# Patient Record
Sex: Female | Born: 1970
Health system: Southern US, Community
[De-identification: ages and names within clinical notes are randomized; demographics above are authoritative.]

## PROBLEM LIST (undated history)

## (undated) DIAGNOSIS — J45909 Unspecified asthma, uncomplicated: Secondary | ICD-10-CM

## (undated) DIAGNOSIS — K219 Gastro-esophageal reflux disease without esophagitis: Secondary | ICD-10-CM

## (undated) DIAGNOSIS — M797 Fibromyalgia: Secondary | ICD-10-CM

## (undated) DIAGNOSIS — D510 Vitamin B12 deficiency anemia due to intrinsic factor deficiency: Secondary | ICD-10-CM

## (undated) DIAGNOSIS — K2270 Barrett's esophagus without dysplasia: Secondary | ICD-10-CM

## (undated) DIAGNOSIS — E039 Hypothyroidism, unspecified: Secondary | ICD-10-CM

## (undated) DIAGNOSIS — D649 Anemia, unspecified: Secondary | ICD-10-CM

## (undated) DIAGNOSIS — E041 Nontoxic single thyroid nodule: Secondary | ICD-10-CM

## (undated) DIAGNOSIS — F32A Depression, unspecified: Secondary | ICD-10-CM

## (undated) DIAGNOSIS — Z8719 Personal history of other diseases of the digestive system: Secondary | ICD-10-CM

## (undated) DIAGNOSIS — M1990 Unspecified osteoarthritis, unspecified site: Secondary | ICD-10-CM

## (undated) DIAGNOSIS — M719 Bursopathy, unspecified: Secondary | ICD-10-CM

## (undated) HISTORY — PX: CHOLECYSTECTOMY: SHX55

## (undated) HISTORY — DX: Fibromyalgia: M79.7

## (undated) HISTORY — DX: Barrett's esophagus without dysplasia: K22.70

## (undated) HISTORY — PX: CHOLECYSTECTOMY, LAPAROSCOPIC: SHX56

## (undated) HISTORY — DX: Bursopathy, unspecified: M71.9

## (undated) HISTORY — DX: Vitamin B12 deficiency anemia due to intrinsic factor deficiency: D51.0

---

## 1999-11-28 ENCOUNTER — Inpatient Hospital Stay (HOSPITAL_COMMUNITY): Admission: AD | Admit: 1999-11-28 | Discharge: 1999-12-02 | Payer: Self-pay | Admitting: *Deleted

## 2002-01-23 ENCOUNTER — Other Ambulatory Visit: Admission: RE | Admit: 2002-01-23 | Discharge: 2002-01-23 | Payer: Self-pay | Admitting: *Deleted

## 2003-01-15 ENCOUNTER — Other Ambulatory Visit: Admission: RE | Admit: 2003-01-15 | Discharge: 2003-01-15 | Payer: Self-pay | Admitting: Obstetrics & Gynecology

## 2004-01-24 ENCOUNTER — Other Ambulatory Visit: Admission: RE | Admit: 2004-01-24 | Discharge: 2004-01-24 | Payer: Self-pay | Admitting: Obstetrics & Gynecology

## 2004-05-04 ENCOUNTER — Other Ambulatory Visit: Admission: RE | Admit: 2004-05-04 | Discharge: 2004-05-04 | Payer: Self-pay | Admitting: Obstetrics and Gynecology

## 2005-05-23 ENCOUNTER — Ambulatory Visit: Payer: Self-pay

## 2005-05-30 ENCOUNTER — Ambulatory Visit: Payer: Self-pay

## 2005-06-04 ENCOUNTER — Ambulatory Visit: Payer: Self-pay | Admitting: General Surgery

## 2005-09-17 ENCOUNTER — Ambulatory Visit: Payer: Self-pay | Admitting: Gastroenterology

## 2005-10-31 ENCOUNTER — Ambulatory Visit: Payer: Self-pay | Admitting: Podiatry

## 2006-11-22 ENCOUNTER — Ambulatory Visit: Payer: Self-pay | Admitting: Gastroenterology

## 2007-06-19 ENCOUNTER — Ambulatory Visit: Payer: Self-pay | Admitting: Internal Medicine

## 2009-02-01 ENCOUNTER — Ambulatory Visit: Payer: Self-pay | Admitting: Gastroenterology

## 2009-03-22 ENCOUNTER — Inpatient Hospital Stay (HOSPITAL_COMMUNITY): Admission: AD | Admit: 2009-03-22 | Discharge: 2009-03-23 | Payer: Self-pay | Admitting: Obstetrics & Gynecology

## 2010-03-14 ENCOUNTER — Ambulatory Visit: Payer: Self-pay | Admitting: Internal Medicine

## 2010-04-21 ENCOUNTER — Ambulatory Visit: Payer: Self-pay | Admitting: Internal Medicine

## 2010-12-20 ENCOUNTER — Ambulatory Visit: Payer: Self-pay | Admitting: Internal Medicine

## 2010-12-22 ENCOUNTER — Ambulatory Visit: Payer: Self-pay | Admitting: Urology

## 2011-04-05 LAB — WET PREP, GENITAL
Trich, Wet Prep: NONE SEEN
Yeast Wet Prep HPF POC: NONE SEEN

## 2011-04-05 LAB — URINALYSIS, ROUTINE W REFLEX MICROSCOPIC
Bilirubin Urine: NEGATIVE
Hgb urine dipstick: NEGATIVE
Nitrite: NEGATIVE
Specific Gravity, Urine: >1.03 — ABNORMAL HIGH (ref 1.005–1.030)
pH: 5.5 (ref 5.0–8.0)

## 2011-04-05 LAB — POCT PREGNANCY, URINE: Preg Test, Ur: NEGATIVE

## 2011-05-11 NOTE — Op Note (Signed)
[facility]  Patient:    Valerie Welch                  MRN: [id number] Proc. Date: 11/29/99 Adm. Date:  [id number] Attending:  Ardeen Fillers                           Operative Report  PREOPERATIVE DIAGNOSIS:       Previous cesarean section, oligohydramnios, early  labor.  POSTOPERATIVE DIAGNOSIS:      Left occipitotransverse position, previous cesarean section, oligohydramnios, early labor.  OPERATION:                    Repeat cesarean section, Kerr hysterotomy.  SURGEON:                      Sheronette A. Cherly Hensen, M.D.  ASSISTANT:                    Sung Amabile. Roslyn Smiling, M.D.  ANESTHESIA:                   Failed attempt spinal, failed attempt epidural, general anesthesia.  ESTIMATED BLOOD LOSS:  INDICATIONS:                  This is a 40 year old, gravida 5, para 1-0-3-1, female at 38-1/[redacted] weeks gestation admitted for induction of labor on November 28, 1999, by Huntley Dec E. Roslyn Smiling, M.D. secondary to oligohydramnios.  The patient had a previous cesarean section secondary to arrest of dilatation.  She desired attempt at vaginal birth.  The patient underwent cervical ripening on November 28, 1999.  She was started on Pitocin on November 29, 1999, and once she had reached maximum Pitocin for a prolonged period of time, the cervical examination had still remained at 1 cm, 50%, and -2 presentation.  Amniotomy was performed.  Scant, but clear fluid was noted.  Intrauterine pressure catheter was placed as well as internal  fetal scalp electrode.  Amnioinfusion was done due to the history of oligohydramnios.  The patient progressed to about 3 cm, still at -2 station and  about 50% effaced.  The patient then requested to have a repeat cesarean section. The risks and benefits of the procedure was explained and the patient was transferred to the operating room for her procedure.  DESCRIPTION OF PROCEDURE:     The patient underwent an attempt  at spinal and an  epidural anesthesia, however, due to the lack of an effective regional anesthesia, general induction of anesthesia was performed.  The patient had been at that point sterilely prepped and draped and an indewelling Foley catheter had been placed. A Pfannenstiel skin incision was then made through the previous scar, carried down to the rectus fascia using scalpel.  The rectus fascia was incised in the midline nd extended bilaterally.  The rectus fascia was then bluntly and with sharp dissection dissected off of the rectus muscle in a superior and inferior fashion.  The rectus muscle was split in the midline.  The parietoperitoneum was then entered and extended superiorly and inferiorly.  The vesicouterine peritoneum was then opened. The bladder was then bluntly dissected off the lower uterine segment and displaced from the operative field using a Doyen retractor.  It was noted on entry to the  abdomen that the omentum was adherent to the anterior abdominal wall without defects within it.  A low  transverse uterine incision was then made and extended bluntly.  It was then noted that the baby was in a left occipitotransverse position.  The baby was subsequently delivered.  The nares and mouth were suctioned.  Cord was clamped.  This was a live female infant who was subsequently passed off to the awaiting pediatricians.  The pediatrician assigned Apgars of  and 9 at one and five minutes, respectively.  The placenta was spontaneous. The uterus was exteriorized.  The uterine cavity was cleaned of debris.  The uterine incision was closed with 0 Monocryl in a running locking stitch.  On the right inferior aspect, there was some bleeding noted at the angle.  A figure-of-eight  suture was then placed.  Additional bleeding was still noted at that particular  site and an OLeary suture then was placed with good hemostasis subsequently noted. Normal tubes and ovaries were  noted bilaterally.  The uterus was returned to the abdomen.  The abdomen was irrigated, suctioned of debris.  Reinspection of the incision sites showed good hemostasis.  The parietal and the vesicouterine peritoneum were not closed.  Inspection of the undersurface of the rectus fascia showed good hemostasis.  The rectus fascia was closed with 0 Vicryl x 2.  The subcutaneous area was irrigated.  No bleeders were noted.  The skin was approximated using Ethicon staple.  Specimen was placenta not sent to pathology. Estimated blood loss was about 800 cc.  Intraoperative fluid was 1400 cc of crystalloid.  Urine output was 100 cc of clear yellow urine.  Sponge, needle, and instrument counts were correct x 2.  Complications were none.  The patient tolerated the procedure well and was transferred to the recovery room in stable  condition. DD:  11/29/99 TD:  11/30/99 Job: [id number]

## 2011-05-11 NOTE — H&P (Signed)
[facility]  Patient:    Valerie Welch                  MRN: [id number] Adm. Date:  [id number] Attending:  Ardeen Fillers CC:         Sung Amabile. Roslyn Smiling, M.D.                         History and Physical  CHIEF COMPLAINT:              Oligohydramnios, at term.  HISTORY OF PRESENT ILLNESS:   [age]year-old woman, G5, P1-0-3-1, [facility] December 12, 1999, based on last menstrual period of March 06, 1999, admitted at 38+ weeks gestational age for induction because of oligohydramnios.  Ultrasound  performed in the office today for estimated fetal weight, to assist in decision for possible VBAC, showed an estimated fetal weight of 6 pound 15 ounces (3138 g) and an amniotic fluid index in the seventh percentile.  The patient has had no pregnancy-induced hypertension.  Antenatal course remarkable for level 2 ultrasound performed at [facility] because of patients questionable family history of fragile X syndrome.  Patient was evaluated and has no evidence of trinucleotide repeat amplification within FMR1. Gastric reflux treated with antacids.  The patient is status post cesarean section in 1993 for arrest of the active phase of labor for a 3220 g female which was occipitoposterior.  That delivery was complicated by a group B strep bacteremia and postterm endometritis.  The baby as also treated for infection.  PAST MEDICAL HISTORY:         Medical:  Hypothyroidism.  TSH values have been within normal limits with thyroid replacement.  Questionable HSV in the past -- no signs or symptoms during this pregnancy.                                Surgical:  Cesarean section in 1993.                                Obstetrical:  SAB, 12 weeks, 1987; 1988, TAB, first trimester, x 2; 1993, cesarean section, as above.  ALLERGIES:                    No known allergies.  MEDICATIONS:                  Prenatal vitamins and iron.  FAMILY HISTORY:                Sister with history of depression.  Brother died f a stroke at age 31 years of age.  Maternal uncle with Downs syndrome.  SOCIAL HISTORY:               Married.  Production manager.  Denies tobacco or ethanol use.  PHYSICAL EXAMINATION:  VITAL SIGNS:                  Afebrile.  Blood pressure 124/74.  Heart rate 84.  Respiratory rate 20.  Fetal heart rate 140s-150s.  HEENT:                        Within normal limits.  NECK:  Without thyromegaly.  CHEST:                        Clear.  COR:                          Regular rate and rhythm.  S1 and S2 normal.  ABDOMEN:                      Soft, nontender, without organomegaly, mass or hernia.  Well-healed Pfannenstiel incision.  PELVIC:                       Cervix fingertip, 3-cm long, -2 station.  Vertex presentation.  EXTREMITIES:                  Without CCE.  NEUROLOGIC:                   Grossly intact.  ANTENATAL LABORATORY DATA:    AB-positive.  RPR nonreactive.  Rubella immune. Hepatitis B surface antigen negative.  HIV nonreactive.  Alpha-fetoprotein triple screen within normal limits.  One-hour glucola 134 mg%.  Group B strep negative.  Monitoring with Cervidil:     Uterine contractions every five to eight minutes ith reactive tracing and no decelerations.  IMPRESSION:                   1. Intrauterine pregnancy at 38+ weeks gestational                                  age, for induction for #2.                               2. Oligohydramnios.                               3. Status post cesarean section -- patient is a                                  vaginal-birth-after-cesarean-section candidate.                                  She has been counseled regarding the benefits,                                  risks and options regarding vaginal birth after                                  cesarean section versus cesarean section.  She                                   understands the risks of uterine dehiscence and                                  possible  need for emergent cesarean section;                                  desires vaginal birth after cesarean section                                  trial.                               4. History of group B streptococcus sepsis in 1993                                  pregnancy.  Although group B streptococcus negative                                  during this pregnancy, because of history and                                  question of 1993 babys possible group B                                  streptococcus, will treat with penicillin G.                               5. Rh-positive.                               6. History of hypothyroidism with normal thyroid                                  function tests during this pregnancy with ______                                  medication.                               7. Questionable history of herpes simplex virus -- no                                  signs or symptoms at this time. DD:  11/28/99 TD:  11/29/99 Job: [id number]

## 2011-10-10 ENCOUNTER — Other Ambulatory Visit: Payer: Self-pay | Admitting: Obstetrics & Gynecology

## 2011-10-10 DIAGNOSIS — Z1231 Encounter for screening mammogram for malignant neoplasm of breast: Secondary | ICD-10-CM

## 2011-11-05 ENCOUNTER — Ambulatory Visit
Admission: RE | Admit: 2011-11-05 | Discharge: 2011-11-05 | Disposition: A | Discharge status: Home / Self Care | Payer: Managed Care, Other (non HMO) | Source: Ambulatory Visit | Attending: Obstetrics & Gynecology | Admitting: Obstetrics & Gynecology

## 2011-11-05 DIAGNOSIS — Z1231 Encounter for screening mammogram for malignant neoplasm of breast: Secondary | ICD-10-CM

## 2011-11-13 ENCOUNTER — Other Ambulatory Visit: Payer: Self-pay | Admitting: Obstetrics & Gynecology

## 2011-11-13 DIAGNOSIS — R928 Other abnormal and inconclusive findings on diagnostic imaging of breast: Secondary | ICD-10-CM

## 2012-01-16 ENCOUNTER — Ambulatory Visit
Admission: RE | Admit: 2012-01-16 | Discharge: 2012-01-16 | Disposition: A | Discharge status: Home / Self Care | Payer: Managed Care, Other (non HMO) | Source: Ambulatory Visit | Attending: Obstetrics & Gynecology | Admitting: Obstetrics & Gynecology

## 2012-01-16 DIAGNOSIS — R928 Other abnormal and inconclusive findings on diagnostic imaging of breast: Secondary | ICD-10-CM

## 2012-12-29 ENCOUNTER — Other Ambulatory Visit: Payer: Self-pay | Admitting: Obstetrics & Gynecology

## 2012-12-29 DIAGNOSIS — Z1231 Encounter for screening mammogram for malignant neoplasm of breast: Secondary | ICD-10-CM

## 2013-01-12 ENCOUNTER — Ambulatory Visit
Admission: RE | Admit: 2013-01-12 | Discharge: 2013-01-12 | Disposition: A | Discharge status: Home / Self Care | Payer: Managed Care, Other (non HMO) | Source: Ambulatory Visit | Attending: Obstetrics & Gynecology | Admitting: Obstetrics & Gynecology

## 2013-01-12 DIAGNOSIS — Z1231 Encounter for screening mammogram for malignant neoplasm of breast: Secondary | ICD-10-CM

## 2013-10-29 ENCOUNTER — Ambulatory Visit: Payer: Self-pay | Admitting: Gastroenterology

## 2013-11-16 ENCOUNTER — Ambulatory Visit: Payer: Self-pay | Admitting: Gastroenterology

## 2013-11-20 ENCOUNTER — Ambulatory Visit: Payer: Self-pay | Admitting: Gastroenterology

## 2014-01-01 ENCOUNTER — Other Ambulatory Visit: Payer: Self-pay

## 2014-01-01 DIAGNOSIS — Z1231 Encounter for screening mammogram for malignant neoplasm of breast: Secondary | ICD-10-CM

## 2014-01-18 ENCOUNTER — Ambulatory Visit
Admission: RE | Admit: 2014-01-18 | Discharge: 2014-01-18 | Disposition: A | Discharge status: Home / Self Care | Payer: Managed Care, Other (non HMO) | Source: Ambulatory Visit

## 2014-01-18 DIAGNOSIS — Z1231 Encounter for screening mammogram for malignant neoplasm of breast: Secondary | ICD-10-CM

## 2014-04-26 ENCOUNTER — Ambulatory Visit: Payer: Self-pay | Admitting: Gastroenterology

## 2014-04-28 LAB — PATHOLOGY REPORT

## 2014-11-04 DIAGNOSIS — F411 Generalized anxiety disorder: Secondary | ICD-10-CM | POA: Insufficient documentation

## 2014-12-13 ENCOUNTER — Other Ambulatory Visit: Payer: Self-pay

## 2014-12-13 DIAGNOSIS — Z1231 Encounter for screening mammogram for malignant neoplasm of breast: Secondary | ICD-10-CM

## 2015-01-19 ENCOUNTER — Ambulatory Visit
Admission: RE | Admit: 2015-01-19 | Discharge: 2015-01-19 | Disposition: A | Discharge status: Home / Self Care | Payer: BLUE CROSS/BLUE SHIELD | Source: Ambulatory Visit

## 2015-01-19 ENCOUNTER — Encounter (INDEPENDENT_AMBULATORY_CARE_PROVIDER_SITE_OTHER): Payer: Self-pay

## 2015-01-19 DIAGNOSIS — Z1231 Encounter for screening mammogram for malignant neoplasm of breast: Secondary | ICD-10-CM

## 2015-03-07 DIAGNOSIS — G5793 Unspecified mononeuropathy of bilateral lower limbs: Secondary | ICD-10-CM | POA: Insufficient documentation

## 2015-12-27 ENCOUNTER — Other Ambulatory Visit: Payer: Self-pay

## 2015-12-27 DIAGNOSIS — Z1231 Encounter for screening mammogram for malignant neoplasm of breast: Secondary | ICD-10-CM

## 2016-01-24 ENCOUNTER — Ambulatory Visit
Admission: RE | Admit: 2016-01-24 | Discharge: 2016-01-24 | Disposition: A | Discharge status: Home / Self Care | Payer: BLUE CROSS/BLUE SHIELD | Source: Ambulatory Visit

## 2016-01-24 DIAGNOSIS — Z1231 Encounter for screening mammogram for malignant neoplasm of breast: Secondary | ICD-10-CM

## 2017-02-19 DIAGNOSIS — Z30433 Encounter for removal and reinsertion of intrauterine contraceptive device: Secondary | ICD-10-CM | POA: Diagnosis not present

## 2017-02-19 DIAGNOSIS — N854 Malposition of uterus: Secondary | ICD-10-CM | POA: Diagnosis not present

## 2017-03-18 DIAGNOSIS — Z30431 Encounter for routine checking of intrauterine contraceptive device: Secondary | ICD-10-CM | POA: Diagnosis not present

## 2017-04-23 DIAGNOSIS — Z0000 Encounter for general adult medical examination without abnormal findings: Secondary | ICD-10-CM | POA: Diagnosis not present

## 2017-04-23 DIAGNOSIS — G5791 Unspecified mononeuropathy of right lower limb: Secondary | ICD-10-CM | POA: Diagnosis not present

## 2017-04-23 DIAGNOSIS — J4520 Mild intermittent asthma, uncomplicated: Secondary | ICD-10-CM | POA: Diagnosis not present

## 2017-04-23 DIAGNOSIS — G5792 Unspecified mononeuropathy of left lower limb: Secondary | ICD-10-CM | POA: Diagnosis not present

## 2017-04-23 DIAGNOSIS — E039 Hypothyroidism, unspecified: Secondary | ICD-10-CM | POA: Diagnosis not present

## 2017-04-29 DIAGNOSIS — F411 Generalized anxiety disorder: Secondary | ICD-10-CM | POA: Diagnosis not present

## 2017-04-29 DIAGNOSIS — J4520 Mild intermittent asthma, uncomplicated: Secondary | ICD-10-CM | POA: Diagnosis not present

## 2017-04-29 DIAGNOSIS — E041 Nontoxic single thyroid nodule: Secondary | ICD-10-CM | POA: Diagnosis not present

## 2017-04-29 DIAGNOSIS — E039 Hypothyroidism, unspecified: Secondary | ICD-10-CM | POA: Diagnosis not present

## 2017-09-03 DIAGNOSIS — J4520 Mild intermittent asthma, uncomplicated: Secondary | ICD-10-CM | POA: Diagnosis not present

## 2017-09-03 DIAGNOSIS — E039 Hypothyroidism, unspecified: Secondary | ICD-10-CM | POA: Diagnosis not present

## 2017-09-03 DIAGNOSIS — F411 Generalized anxiety disorder: Secondary | ICD-10-CM | POA: Diagnosis not present

## 2017-09-03 DIAGNOSIS — E041 Nontoxic single thyroid nodule: Secondary | ICD-10-CM | POA: Diagnosis not present

## 2017-09-10 DIAGNOSIS — G5791 Unspecified mononeuropathy of right lower limb: Secondary | ICD-10-CM | POA: Diagnosis not present

## 2017-09-10 DIAGNOSIS — J4520 Mild intermittent asthma, uncomplicated: Secondary | ICD-10-CM | POA: Diagnosis not present

## 2017-09-10 DIAGNOSIS — Z23 Encounter for immunization: Secondary | ICD-10-CM | POA: Diagnosis not present

## 2017-09-10 DIAGNOSIS — F411 Generalized anxiety disorder: Secondary | ICD-10-CM | POA: Diagnosis not present

## 2017-09-10 DIAGNOSIS — M79671 Pain in right foot: Secondary | ICD-10-CM | POA: Diagnosis not present

## 2017-09-13 DIAGNOSIS — M79676 Pain in unspecified toe(s): Secondary | ICD-10-CM | POA: Diagnosis not present

## 2017-09-13 DIAGNOSIS — M7062 Trochanteric bursitis, left hip: Secondary | ICD-10-CM | POA: Diagnosis not present

## 2017-09-13 DIAGNOSIS — M7061 Trochanteric bursitis, right hip: Secondary | ICD-10-CM | POA: Diagnosis not present

## 2017-09-13 DIAGNOSIS — M545 Low back pain: Secondary | ICD-10-CM | POA: Diagnosis not present

## 2017-09-24 DIAGNOSIS — M9271 Juvenile osteochondrosis of metatarsus, right foot: Secondary | ICD-10-CM | POA: Diagnosis not present

## 2017-09-24 DIAGNOSIS — M19071 Primary osteoarthritis, right ankle and foot: Secondary | ICD-10-CM | POA: Diagnosis not present

## 2017-09-24 DIAGNOSIS — M2021 Hallux rigidus, right foot: Secondary | ICD-10-CM | POA: Diagnosis not present

## 2017-10-08 DIAGNOSIS — M19071 Primary osteoarthritis, right ankle and foot: Secondary | ICD-10-CM | POA: Diagnosis not present

## 2017-10-08 DIAGNOSIS — M9271 Juvenile osteochondrosis of metatarsus, right foot: Secondary | ICD-10-CM | POA: Diagnosis not present

## 2017-10-08 DIAGNOSIS — M2021 Hallux rigidus, right foot: Secondary | ICD-10-CM | POA: Diagnosis not present

## 2017-11-04 ENCOUNTER — Encounter: Payer: Self-pay | Admitting: Obstetrics & Gynecology

## 2017-11-05 DIAGNOSIS — M2021 Hallux rigidus, right foot: Secondary | ICD-10-CM | POA: Diagnosis not present

## 2017-11-05 DIAGNOSIS — M9271 Juvenile osteochondrosis of metatarsus, right foot: Secondary | ICD-10-CM | POA: Diagnosis not present

## 2017-11-05 DIAGNOSIS — M25571 Pain in right ankle and joints of right foot: Secondary | ICD-10-CM | POA: Diagnosis not present

## 2017-11-05 DIAGNOSIS — M19071 Primary osteoarthritis, right ankle and foot: Secondary | ICD-10-CM | POA: Diagnosis not present

## 2017-11-18 ENCOUNTER — Other Ambulatory Visit: Payer: Self-pay | Admitting: Podiatry

## 2017-11-18 DIAGNOSIS — M25571 Pain in right ankle and joints of right foot: Secondary | ICD-10-CM

## 2017-12-09 ENCOUNTER — Ambulatory Visit
Admission: RE | Admit: 2017-12-09 | Discharge: 2017-12-09 | Disposition: A | Discharge status: Home / Self Care | Payer: BLUE CROSS/BLUE SHIELD | Source: Ambulatory Visit | Attending: Podiatry | Admitting: Podiatry

## 2017-12-09 DIAGNOSIS — M899 Disorder of bone, unspecified: Secondary | ICD-10-CM | POA: Diagnosis not present

## 2017-12-09 DIAGNOSIS — M25571 Pain in right ankle and joints of right foot: Secondary | ICD-10-CM | POA: Diagnosis not present

## 2017-12-09 DIAGNOSIS — M65871 Other synovitis and tenosynovitis, right ankle and foot: Secondary | ICD-10-CM | POA: Diagnosis not present

## 2017-12-09 DIAGNOSIS — M7989 Other specified soft tissue disorders: Secondary | ICD-10-CM | POA: Diagnosis not present

## 2017-12-09 DIAGNOSIS — M7731 Calcaneal spur, right foot: Secondary | ICD-10-CM | POA: Diagnosis not present

## 2017-12-09 DIAGNOSIS — M19071 Primary osteoarthritis, right ankle and foot: Secondary | ICD-10-CM | POA: Diagnosis not present

## 2017-12-12 ENCOUNTER — Ambulatory Visit (INDEPENDENT_AMBULATORY_CARE_PROVIDER_SITE_OTHER): Payer: BLUE CROSS/BLUE SHIELD | Admitting: Obstetrics & Gynecology

## 2017-12-12 ENCOUNTER — Encounter: Payer: Self-pay | Admitting: Obstetrics & Gynecology

## 2017-12-12 VITALS — BP 130/84 | Ht 61.0 in | Wt 173.0 lb

## 2017-12-12 DIAGNOSIS — Z1151 Encounter for screening for human papillomavirus (HPV): Secondary | ICD-10-CM

## 2017-12-12 DIAGNOSIS — Z975 Presence of (intrauterine) contraceptive device: Secondary | ICD-10-CM | POA: Diagnosis not present

## 2017-12-12 DIAGNOSIS — N921 Excessive and frequent menstruation with irregular cycle: Secondary | ICD-10-CM | POA: Diagnosis not present

## 2017-12-12 DIAGNOSIS — Z01419 Encounter for gynecological examination (general) (routine) without abnormal findings: Secondary | ICD-10-CM | POA: Diagnosis not present

## 2017-12-12 DIAGNOSIS — Z30431 Encounter for routine checking of intrauterine contraceptive device: Secondary | ICD-10-CM

## 2017-12-12 MED ORDER — NORETHINDRONE 0.35 MG PO TABS: 1.0000 | ORAL_TABLET | Freq: Every day | ORAL | 4 refills | Status: DC

## 2017-12-12 NOTE — Progress Notes (Signed)
Valerie Welch 03/24/1971 [id number]   History:    46 y.o. G70P2A2 Married  RP:  Established  for annual gyn exam   HPI:  Well on Mirena IUD x 01/2017 with Progestin pill to control BTB.  Ran out of Progestin pill 2 weeks ago and is having very mild spotting currently.  No pelvic pain.  Breasts wnl.  BMs/Urine normal.  Regular fitness, Eleptical and spinning currently.  Past medical history,surgical history, family history and social history were all reviewed and documented in the EPIC chart.  Gynecologic History No LMP recorded. Contraception: IUD Mirena x 01/2017 Last Pap: 2016. Results were: normal Last mammogram: 12/2015. Results were: Negative, [facility]  Obstetric History OB History  Gravida Para Term Preterm AB Living  5 2     2 2   SAB TAB Ectopic Multiple Live Births  1            # Outcome Date GA Lbr Len/2nd Weight Sex Delivery Anes PTL Lv  5 Gravida           4 AB           3 SAB           2 Para           1 Para                ROS: A ROS was performed and pertinent positives and negatives are included in the history.  GENERAL: No fevers or chills. HEENT: No change in vision, no earache, sore throat or sinus congestion. NECK: No pain or stiffness. CARDIOVASCULAR: No chest pain or pressure. No palpitations. PULMONARY: No shortness of breath, cough or wheeze. GASTROINTESTINAL: No abdominal pain, nausea, vomiting or diarrhea, melena or bright red blood per rectum. GENITOURINARY: No urinary frequency, urgency, hesitancy or dysuria. MUSCULOSKELETAL: No joint or muscle pain, no back pain, no recent trauma. DERMATOLOGIC: No rash, no itching, no lesions. ENDOCRINE: No polyuria, polydipsia, no heat or cold intolerance. No recent change in weight. HEMATOLOGICAL: No anemia or easy bruising or bleeding. NEUROLOGIC: No headache, seizures, numbness, tingling or weakness. PSYCHIATRIC: No depression, no loss of interest in normal activity or change in  sleep pattern.     Exam:   BP 130/84   Ht 5\' 1"  (1.549 m)   Wt 173 lb (78.5 kg)   BMI 32.69 kg/m   Body mass index is 32.69 kg/m.  General appearance : Well developed well nourished female. No acute distress HEENT: Eyes: no retinal hemorrhage or exudates,  Neck supple, trachea midline, no carotid bruits, no thyroidmegaly Lungs: Clear to auscultation, no rhonchi or wheezes, or rib retractions  Heart: Regular rate and rhythm, no murmurs or gallops Breast:Examined in sitting and supine position were symmetrical in appearance, no palpable masses or tenderness,  no skin retraction, no nipple inversion, no nipple discharge, no skin discoloration, no axillary or supraclavicular lymphadenopathy Abdomen: no palpable masses or tenderness, no rebound or guarding Extremities: no edema or skin discoloration or tenderness  Pelvic: Vulva normal  Bartholin, Urethra, Skene Glands: Within normal limits             Vagina: No gross lesions or discharge  Cervix: No gross lesions or discharge.  Pap/HR HPV done.  IUD strings visible.  Uterus  AV, normal size, shape and consistency, non-tender and mobile  Adnexa  Without masses or tenderness  Anus and perineum  normal     Assessment/Plan:  46 y.o.  female for annual exam   1. Encounter for routine gynecological examination with Papanicolaou smear of cervix Normal gynecologic exam.  Pap with high risk HPV done.  Breast exam normal.  Will schedule screening mammogram.  Health labs with family physician.  2. Encounter for routine checking of intrauterine contraceptive device (IUD) Mirena IUD in good location and well-tolerated.  Will continue to control breakthrough bleeding with progestin only pill.  3. Breakthrough bleeding associated with intrauterine device (IUD) Controled on Progestin-only pill.  No contraindication.  Represcribed.  Other orders - norethindrone (MICRONOR,CAMILA,ERRIN) 0.35 MG tablet; Take 1 tablet (0.35 mg total) by mouth  daily.  Princess Bruins MD, 3:47 PM 12/12/2017

## 2017-12-15 ENCOUNTER — Encounter: Payer: Self-pay | Admitting: Obstetrics & Gynecology

## 2017-12-15 NOTE — Patient Instructions (Signed)
1. Encounter for routine gynecological examination with Papanicolaou smear of cervix Normal gynecologic exam.  Pap with high risk HPV done.  Breast exam normal.  Will schedule screening mammogram.  Health labs with family physician.  2. Encounter for routine checking of intrauterine contraceptive device (IUD) Mirena IUD in good location and well-tolerated.  Will continue to control breakthrough bleeding with progestin only pill.  3. Breakthrough bleeding associated with intrauterine device (IUD) Controled on Progestin-only pill.  No contraindication.  Represcribed.  Other orders - norethindrone (MICRONOR,CAMILA,ERRIN) 0.35 MG tablet; Take 1 tablet (0.35 mg total) by mouth daily.  Valerie Welch, it was a pleasure seeing you today!  I will inform you of your results as soon as available.  Health Maintenance, Female Adopting a healthy lifestyle and getting preventive care can go a long way to promote health and wellness. Talk with your health care provider about what schedule of regular examinations is right for you. This is a good chance for you to check in with your provider about disease prevention and staying healthy. In between checkups, there are plenty of things you can do on your own. Experts have done a lot of research about which lifestyle changes and preventive measures are most likely to keep you healthy. Ask your health care provider for more information. Weight and diet Eat a healthy diet  Be sure to include plenty of vegetables, fruits, low-fat dairy products, and lean protein.  Do not eat a lot of foods high in solid fats, added sugars, or salt.  Get regular exercise. This is one of the most important things you can do for your health. ? Most adults should exercise for at least 150 minutes each week. The exercise should increase your heart rate and make you sweat (moderate-intensity exercise). ? Most adults should also do strengthening exercises at least twice a week. This is in  addition to the moderate-intensity exercise.  Maintain a healthy weight  Body mass index (BMI) is a measurement that can be used to identify possible weight problems. It estimates body fat based on height and weight. Your health care provider can help determine your BMI and help you achieve or maintain a healthy weight.  For females 4 years of age and older: ? A BMI below 18.5 is considered underweight. ? A BMI of 18.5 to 24.9 is normal. ? A BMI of 25 to 29.9 is considered overweight. ? A BMI of 30 and above is considered obese.  Watch levels of cholesterol and blood lipids  You should start having your blood tested for lipids and cholesterol at 47 years of age, then have this test every 5 years.  You may need to have your cholesterol levels checked more often if: ? Your lipid or cholesterol levels are high. ? You are older than 47 years of age. ? You are at high risk for heart disease.  Cancer screening Lung Cancer  Lung cancer screening is recommended for adults 65-68 years old who are at high risk for lung cancer because of a history of smoking.  A yearly low-dose CT scan of the lungs is recommended for people who: ? Currently smoke. ? Have quit within the past 15 years. ? Have at least a 30-pack-year history of smoking. A pack year is smoking an average of one pack of cigarettes a day for 1 year.  Yearly screening should continue until it has been 15 years since you quit.  Yearly screening should stop if you develop a health problem that would prevent  you from having lung cancer treatment.  Breast Cancer  Practice breast self-awareness. This means understanding how your breasts normally appear and feel.  It also means doing regular breast self-exams. Let your health care provider know about any changes, no matter how small.  If you are in your 20s or 30s, you should have a clinical breast exam (CBE) by a health care provider every 1-3 years as part of a regular health  exam.  If you are 69 or older, have a CBE every year. Also consider having a breast X-ray (mammogram) every year.  If you have a family history of breast cancer, talk to your health care provider about genetic screening.  If you are at high risk for breast cancer, talk to your health care provider about having an MRI and a mammogram every year.  Breast cancer gene (BRCA) assessment is recommended for women who have family members with BRCA-related cancers. BRCA-related cancers include: ? Breast. ? Ovarian. ? Tubal. ? Peritoneal cancers.  Results of the assessment will determine the need for genetic counseling and BRCA1 and BRCA2 testing.  Cervical Cancer Your health care provider may recommend that you be screened regularly for cancer of the pelvic organs (ovaries, uterus, and vagina). This screening involves a pelvic examination, including checking for microscopic changes to the surface of your cervix (Pap test). You may be encouraged to have this screening done every 3 years, beginning at age 29.  For women ages 36-65, health care providers may recommend pelvic exams and Pap testing every 3 years, or they may recommend the Pap and pelvic exam, combined with testing for human papilloma virus (HPV), every 5 years. Some types of HPV increase your risk of cervical cancer. Testing for HPV may also be done on women of any age with unclear Pap test results.  Other health care providers may not recommend any screening for nonpregnant women who are considered low risk for pelvic cancer and who do not have symptoms. Ask your health care provider if a screening pelvic exam is right for you.  If you have had past treatment for cervical cancer or a condition that could lead to cancer, you need Pap tests and screening for cancer for at least 20 years after your treatment. If Pap tests have been discontinued, your risk factors (such as having a new sexual partner) need to be reassessed to determine if  screening should resume. Some women have medical problems that increase the chance of getting cervical cancer. In these cases, your health care provider may recommend more frequent screening and Pap tests.  Colorectal Cancer  This type of cancer can be detected and often prevented.  Routine colorectal cancer screening usually begins at 47 years of age and continues through 47 years of age.  Your health care provider may recommend screening at an earlier age if you have risk factors for colon cancer.  Your health care provider may also recommend using home test kits to check for hidden blood in the stool.  A small camera at the end of a tube can be used to examine your colon directly (sigmoidoscopy or colonoscopy). This is done to check for the earliest forms of colorectal cancer.  Routine screening usually begins at age 69.  Direct examination of the colon should be repeated every 5-10 years through 47 years of age. However, you may need to be screened more often if early forms of precancerous polyps or small growths are found.  Skin Cancer  Check your skin  from head to toe regularly.  Tell your health care provider about any new moles or changes in moles, especially if there is a change in a mole's shape or color.  Also tell your health care provider if you have a mole that is larger than the size of a pencil eraser.  Always use sunscreen. Apply sunscreen liberally and repeatedly throughout the day.  Protect yourself by wearing long sleeves, pants, a wide-brimmed hat, and sunglasses whenever you are outside.  Heart disease, diabetes, and high blood pressure  High blood pressure causes heart disease and increases the risk of stroke. High blood pressure is more likely to develop in: ? People who have blood pressure in the high end of the normal range (130-139/85-89 mm Hg). ? People who are overweight or obese. ? People who are African American.  If you are 75-75 years of age, have  your blood pressure checked every 3-5 years. If you are 1 years of age or older, have your blood pressure checked every year. You should have your blood pressure measured twice-once when you are at a hospital or clinic, and once when you are not at a hospital or clinic. Record the average of the two measurements. To check your blood pressure when you are not at a hospital or clinic, you can use: ? An automated blood pressure machine at a pharmacy. ? A home blood pressure monitor.  If you are between 12 years and 47 years old, ask your health care provider if you should take aspirin to prevent strokes.  Have regular diabetes screenings. This involves taking a blood sample to check your fasting blood sugar level. ? If you are at a normal weight and have a low risk for diabetes, have this test once every three years after 47 years of age. ? If you are overweight and have a high risk for diabetes, consider being tested at a younger age or more often. Preventing infection Hepatitis B  If you have a higher risk for hepatitis B, you should be screened for this virus. You are considered at high risk for hepatitis B if: ? You were born in a country where hepatitis B is common. Ask your health care provider which countries are considered high risk. ? Your parents were born in a high-risk country, and you have not been immunized against hepatitis B (hepatitis B vaccine). ? You have HIV or AIDS. ? You use needles to inject street drugs. ? You live with someone who has hepatitis B. ? You have had sex with someone who has hepatitis B. ? You get hemodialysis treatment. ? You take certain medicines for conditions, including cancer, organ transplantation, and autoimmune conditions.  Hepatitis C  Blood testing is recommended for: ? Everyone born from [birth year] through 1966. ? Anyone with known risk factors for hepatitis C.  Sexually transmitted infections (STIs)  You should be screened for sexually  transmitted infections (STIs) including gonorrhea and chlamydia if: ? You are sexually active and are younger than 47 years of age. ? You are older than 47 years of age and your health care provider tells you that you are at risk for this type of infection. ? Your sexual activity has changed since you were last screened and you are at an increased risk for chlamydia or gonorrhea. Ask your health care provider if you are at risk.  If you do not have HIV, but are at risk, it may be recommended that you take a prescription medicine daily to  prevent HIV infection. This is called pre-exposure prophylaxis (PrEP). You are considered at risk if: ? You are sexually active and do not regularly use condoms or know the HIV status of your partner(s). ? You take drugs by injection. ? You are sexually active with a partner who has HIV.  Talk with your health care provider about whether you are at high risk of being infected with HIV. If you choose to begin PrEP, you should first be tested for HIV. You should then be tested every 3 months for as long as you are taking PrEP. Pregnancy  If you are premenopausal and you may become pregnant, ask your health care provider about preconception counseling.  If you may become pregnant, take 400 to 800 micrograms (mcg) of folic acid every day.  If you want to prevent pregnancy, talk to your health care provider about birth control (contraception). Osteoporosis and menopause  Osteoporosis is a disease in which the bones lose minerals and strength with aging. This can result in serious bone fractures. Your risk for osteoporosis can be identified using a bone density scan.  If you are 76 years of age or older, or if you are at risk for osteoporosis and fractures, ask your health care provider if you should be screened.  Ask your health care provider whether you should take a calcium or vitamin D supplement to lower your risk for osteoporosis.  Menopause may have  certain physical symptoms and risks.  Hormone replacement therapy may reduce some of these symptoms and risks. Talk to your health care provider about whether hormone replacement therapy is right for you. Follow these instructions at home:  Schedule regular health, dental, and eye exams.  Stay current with your immunizations.  Do not use any tobacco products including cigarettes, chewing tobacco, or electronic cigarettes.  If you are pregnant, do not drink alcohol.  If you are breastfeeding, limit how much and how often you drink alcohol.  Limit alcohol intake to no more than 1 drink per day for nonpregnant women. One drink equals 12 ounces of beer, 5 ounces of wine, or 1 ounces of hard liquor.  Do not use street drugs.  Do not share needles.  Ask your health care provider for help if you need support or information about quitting drugs.  Tell your health care provider if you often feel depressed.  Tell your health care provider if you have ever been abused or do not feel safe at home. This information is not intended to replace advice given to you by your health care provider. Make sure you discuss any questions you have with your health care provider. Document Released: 06/25/2011 Document Revised: 05/17/2016 Document Reviewed: 09/13/2015 Elsevier Interactive Patient Education  [employer].

## 2017-12-18 LAB — PAP, TP IMAGING W/ HPV RNA, RFLX HPV TYPE 16,18/45: HPV DNA HIGH RISK: NOT DETECTED

## 2018-01-10 ENCOUNTER — Other Ambulatory Visit: Payer: Self-pay | Admitting: Obstetrics & Gynecology

## 2018-01-10 DIAGNOSIS — Z1231 Encounter for screening mammogram for malignant neoplasm of breast: Secondary | ICD-10-CM

## 2018-01-13 ENCOUNTER — Ambulatory Visit
Admission: RE | Admit: 2018-01-13 | Discharge: 2018-01-13 | Disposition: A | Discharge status: Home / Self Care | Payer: BLUE CROSS/BLUE SHIELD | Source: Ambulatory Visit | Attending: Obstetrics & Gynecology | Admitting: Obstetrics & Gynecology

## 2018-01-13 DIAGNOSIS — Z1231 Encounter for screening mammogram for malignant neoplasm of breast: Secondary | ICD-10-CM | POA: Diagnosis not present

## 2018-01-14 ENCOUNTER — Other Ambulatory Visit: Payer: Self-pay | Admitting: Obstetrics & Gynecology

## 2018-01-14 DIAGNOSIS — R928 Other abnormal and inconclusive findings on diagnostic imaging of breast: Secondary | ICD-10-CM

## 2018-01-15 DIAGNOSIS — E039 Hypothyroidism, unspecified: Secondary | ICD-10-CM | POA: Diagnosis not present

## 2018-01-15 DIAGNOSIS — M79661 Pain in right lower leg: Secondary | ICD-10-CM | POA: Diagnosis not present

## 2018-01-15 DIAGNOSIS — M2021 Hallux rigidus, right foot: Secondary | ICD-10-CM | POA: Diagnosis not present

## 2018-01-15 DIAGNOSIS — M958 Other specified acquired deformities of musculoskeletal system: Secondary | ICD-10-CM | POA: Diagnosis not present

## 2018-01-15 DIAGNOSIS — Z0000 Encounter for general adult medical examination without abnormal findings: Secondary | ICD-10-CM | POA: Diagnosis not present

## 2018-01-15 DIAGNOSIS — M9271 Juvenile osteochondrosis of metatarsus, right foot: Secondary | ICD-10-CM | POA: Diagnosis not present

## 2018-01-15 DIAGNOSIS — J0191 Acute recurrent sinusitis, unspecified: Secondary | ICD-10-CM | POA: Diagnosis not present

## 2018-01-15 DIAGNOSIS — J4520 Mild intermittent asthma, uncomplicated: Secondary | ICD-10-CM | POA: Diagnosis not present

## 2018-02-10 ENCOUNTER — Other Ambulatory Visit: Payer: Self-pay | Admitting: Obstetrics & Gynecology

## 2018-02-10 ENCOUNTER — Ambulatory Visit: Payer: BLUE CROSS/BLUE SHIELD

## 2018-02-10 ENCOUNTER — Ambulatory Visit
Admission: RE | Admit: 2018-02-10 | Discharge: 2018-02-10 | Disposition: A | Discharge status: Home / Self Care | Payer: BLUE CROSS/BLUE SHIELD | Source: Ambulatory Visit | Attending: Obstetrics & Gynecology | Admitting: Obstetrics & Gynecology

## 2018-02-10 DIAGNOSIS — N6310 Unspecified lump in the right breast, unspecified quadrant: Secondary | ICD-10-CM | POA: Diagnosis not present

## 2018-02-10 DIAGNOSIS — R928 Other abnormal and inconclusive findings on diagnostic imaging of breast: Secondary | ICD-10-CM

## 2018-02-10 DIAGNOSIS — N6320 Unspecified lump in the left breast, unspecified quadrant: Secondary | ICD-10-CM | POA: Diagnosis not present

## 2018-02-10 DIAGNOSIS — M958 Other specified acquired deformities of musculoskeletal system: Secondary | ICD-10-CM | POA: Diagnosis not present

## 2018-02-10 DIAGNOSIS — M9271 Juvenile osteochondrosis of metatarsus, right foot: Secondary | ICD-10-CM | POA: Diagnosis not present

## 2018-02-10 DIAGNOSIS — M76821 Posterior tibial tendinitis, right leg: Secondary | ICD-10-CM | POA: Diagnosis not present

## 2018-02-10 DIAGNOSIS — R922 Inconclusive mammogram: Secondary | ICD-10-CM | POA: Diagnosis not present

## 2018-02-10 DIAGNOSIS — M2021 Hallux rigidus, right foot: Secondary | ICD-10-CM | POA: Diagnosis not present

## 2018-04-08 DIAGNOSIS — M76821 Posterior tibial tendinitis, right leg: Secondary | ICD-10-CM | POA: Diagnosis not present

## 2018-04-08 DIAGNOSIS — M2021 Hallux rigidus, right foot: Secondary | ICD-10-CM | POA: Diagnosis not present

## 2018-05-08 DIAGNOSIS — E039 Hypothyroidism, unspecified: Secondary | ICD-10-CM | POA: Diagnosis not present

## 2018-05-08 DIAGNOSIS — Z0000 Encounter for general adult medical examination without abnormal findings: Secondary | ICD-10-CM | POA: Diagnosis not present

## 2018-05-08 DIAGNOSIS — J0191 Acute recurrent sinusitis, unspecified: Secondary | ICD-10-CM | POA: Diagnosis not present

## 2018-05-08 DIAGNOSIS — J4520 Mild intermittent asthma, uncomplicated: Secondary | ICD-10-CM | POA: Diagnosis not present

## 2018-05-15 DIAGNOSIS — F411 Generalized anxiety disorder: Secondary | ICD-10-CM | POA: Diagnosis not present

## 2018-05-15 DIAGNOSIS — E039 Hypothyroidism, unspecified: Secondary | ICD-10-CM | POA: Diagnosis not present

## 2018-05-15 DIAGNOSIS — J4520 Mild intermittent asthma, uncomplicated: Secondary | ICD-10-CM | POA: Diagnosis not present

## 2018-05-15 DIAGNOSIS — K219 Gastro-esophageal reflux disease without esophagitis: Secondary | ICD-10-CM | POA: Diagnosis not present

## 2018-08-11 ENCOUNTER — Other Ambulatory Visit: Payer: Self-pay | Admitting: Obstetrics & Gynecology

## 2018-08-11 ENCOUNTER — Ambulatory Visit
Admission: RE | Admit: 2018-08-11 | Discharge: 2018-08-11 | Disposition: A | Discharge status: Home / Self Care | Payer: BLUE CROSS/BLUE SHIELD | Source: Ambulatory Visit | Attending: Obstetrics & Gynecology | Admitting: Obstetrics & Gynecology

## 2018-08-11 DIAGNOSIS — N6310 Unspecified lump in the right breast, unspecified quadrant: Secondary | ICD-10-CM

## 2018-08-11 DIAGNOSIS — N6312 Unspecified lump in the right breast, upper inner quadrant: Secondary | ICD-10-CM | POA: Diagnosis not present

## 2018-08-21 DIAGNOSIS — M76821 Posterior tibial tendinitis, right leg: Secondary | ICD-10-CM | POA: Diagnosis not present

## 2018-08-21 DIAGNOSIS — M958 Other specified acquired deformities of musculoskeletal system: Secondary | ICD-10-CM | POA: Diagnosis not present

## 2018-08-21 DIAGNOSIS — M2021 Hallux rigidus, right foot: Secondary | ICD-10-CM | POA: Diagnosis not present

## 2018-08-21 DIAGNOSIS — M9271 Juvenile osteochondrosis of metatarsus, right foot: Secondary | ICD-10-CM | POA: Diagnosis not present

## 2018-08-22 ENCOUNTER — Other Ambulatory Visit: Payer: Self-pay | Admitting: Podiatry

## 2018-09-09 ENCOUNTER — Encounter
Admission: RE | Admit: 2018-09-09 | Discharge: 2018-09-09 | Disposition: A | Discharge status: Home / Self Care | Payer: BLUE CROSS/BLUE SHIELD | Source: Ambulatory Visit | Attending: Podiatry | Admitting: Podiatry

## 2018-09-09 ENCOUNTER — Other Ambulatory Visit: Payer: Self-pay

## 2018-09-09 DIAGNOSIS — E039 Hypothyroidism, unspecified: Secondary | ICD-10-CM | POA: Diagnosis not present

## 2018-09-09 DIAGNOSIS — F411 Generalized anxiety disorder: Secondary | ICD-10-CM | POA: Diagnosis not present

## 2018-09-09 DIAGNOSIS — K219 Gastro-esophageal reflux disease without esophagitis: Secondary | ICD-10-CM | POA: Diagnosis not present

## 2018-09-09 DIAGNOSIS — Z01818 Encounter for other preprocedural examination: Secondary | ICD-10-CM | POA: Insufficient documentation

## 2018-09-09 DIAGNOSIS — J45909 Unspecified asthma, uncomplicated: Secondary | ICD-10-CM | POA: Diagnosis not present

## 2018-09-09 DIAGNOSIS — G5793 Unspecified mononeuropathy of bilateral lower limbs: Secondary | ICD-10-CM | POA: Diagnosis not present

## 2018-09-09 DIAGNOSIS — J4520 Mild intermittent asthma, uncomplicated: Secondary | ICD-10-CM | POA: Diagnosis not present

## 2018-09-09 DIAGNOSIS — R9431 Abnormal electrocardiogram [ECG] [EKG]: Secondary | ICD-10-CM | POA: Insufficient documentation

## 2018-09-09 HISTORY — DX: Hypothyroidism, unspecified: E03.9

## 2018-09-09 HISTORY — DX: Unspecified asthma, uncomplicated: J45.909

## 2018-09-09 HISTORY — DX: Gastro-esophageal reflux disease without esophagitis: K21.9

## 2018-09-09 HISTORY — DX: Personal history of other diseases of the digestive system: Z87.19

## 2018-09-09 HISTORY — DX: Anemia, unspecified: D64.9

## 2018-09-09 NOTE — Patient Instructions (Addendum)
  Your procedure is scheduled on: Friday September 26, 2018 Report to Same Day Surgery 2nd floor medical mall [facility] Entrance-take elevator on left to 2nd floor.  Check in with surgery information desk.) To find out your arrival time please call [phone] between 1PM - 3PM on Thursday September 25, 2018  Remember: Instructions that are not followed completely may result in serious medical risk, up to and including death, or upon the discretion of your surgeon and anesthesiologist your surgery may need to be rescheduled.    _x___ 1. Do not eat food (including mints, candies, chewing gum) after midnight the night before your procedure. You may drink clear liquids up to 2 hours before you are scheduled to arrive at the hospital for your procedure.  Do not drink clear liquids within 2 hours of your scheduled arrival to the hospital.  Clear liquids include  --Water or Apple juice without pulp  --Clear carbohydrate beverage such as Gatorade  --Black Coffee or Clear Tea (No milk, no creamers, do not add anything to the coffee or tea)    __x__ 2. No Alcohol for 24 hours before or after surgery.   __x__ 3. No Smoking or e-cigarettes for 24 prior to surgery.  Do not use any chewable tobacco products for at least 6 hour prior to surgery   __x__ 4. Notify your doctor if there is any change in your medical condition (cold, fever, infections).   __x__ 5. On the morning of surgery brush your teeth with toothpaste and water.  You may rinse your mouth with mouth wash if you wish.  Do not swallow any toothpaste or mouthwash.  Please read over the following fact sheets that you were given:   [facility] Preparing for Surgery and or MRSA Information    __x__ Use CHG Soap or sage wipes as directed on instruction sheet    Do not wear jewelry, make-up, hairpins, clips or nail polish.  Do not wear lotions, powders, deodorant, or perfumes.   Do not shave 48 hours prior to surgery.   Do not bring valuables  to the hospital.    [facility] is not responsible for any belongings or valuables.               Contacts, dentures or bridgework may not be worn into surgery.  Leave your suitcase in the car. After surgery it may be brought to your room.  For patients admitted to the hospital, discharge time is determined by your treatment team.  For patients discharged on the day of surgery, you will NOT be permitted to drive yourself home.   _x___ Take anti-hypertensive listed below, cardiac, seizure, asthma, anti-reflux and psychiatric medicines. These include:  1. Wellbutrin/Bupropion  2. Levothyroxine/Synthroid  3. Omeprazole/Prilosec  4. Hydrocodone-Acetaminophen/Vicodin if needed  5. Alprazolam/Xanax if needed  _x___ Follow recommendations from Cardiologist, Pulmonologist or PCP regarding stopping Aspirin, Coumadin, Plavix ,Eliquis, Effient, or Pradaxa, and Pletal.  _x___ 7 days before surgery (Friday September 19, 2018): Stop Anti-inflammatories such as Advil, Ibuprofen, Motrin, Aleve, Naproxen, Naprosyn, Goodies powders or aspirin products. OK to take Tylenol and Celebrex.   _x___ NOW: Stop supplements until after surgery.  But may continue Vitamin D, Vitamin B, and multivitamin.

## 2018-09-09 NOTE — Pre-Procedure Instructions (Signed)
EKG REVIEWED BY DR Andree Elk AND OK TO PROCEED

## 2018-09-16 ENCOUNTER — Other Ambulatory Visit: Payer: Self-pay | Admitting: Internal Medicine

## 2018-09-16 DIAGNOSIS — J4520 Mild intermittent asthma, uncomplicated: Secondary | ICD-10-CM | POA: Diagnosis not present

## 2018-09-16 DIAGNOSIS — M25512 Pain in left shoulder: Secondary | ICD-10-CM

## 2018-09-16 DIAGNOSIS — F411 Generalized anxiety disorder: Secondary | ICD-10-CM | POA: Diagnosis not present

## 2018-09-16 DIAGNOSIS — M958 Other specified acquired deformities of musculoskeletal system: Secondary | ICD-10-CM | POA: Diagnosis not present

## 2018-09-17 ENCOUNTER — Other Ambulatory Visit (HOSPITAL_COMMUNITY): Payer: Self-pay | Admitting: Internal Medicine

## 2018-09-17 DIAGNOSIS — M25512 Pain in left shoulder: Secondary | ICD-10-CM

## 2018-09-22 ENCOUNTER — Ambulatory Visit (HOSPITAL_COMMUNITY)
Admission: RE | Admit: 2018-09-22 | Discharge: 2018-09-22 | Disposition: A | Discharge status: Home / Self Care | Payer: BLUE CROSS/BLUE SHIELD | Source: Ambulatory Visit | Attending: Internal Medicine | Admitting: Internal Medicine

## 2018-09-22 DIAGNOSIS — M25512 Pain in left shoulder: Secondary | ICD-10-CM

## 2018-09-23 ENCOUNTER — Ambulatory Visit (HOSPITAL_COMMUNITY): Admission: RE | Admit: 2018-09-23 | Payer: BLUE CROSS/BLUE SHIELD | Source: Ambulatory Visit

## 2018-09-24 ENCOUNTER — Other Ambulatory Visit (HOSPITAL_COMMUNITY): Payer: Self-pay | Admitting: Internal Medicine

## 2018-09-24 DIAGNOSIS — M25512 Pain in left shoulder: Secondary | ICD-10-CM

## 2018-09-25 ENCOUNTER — Ambulatory Visit (HOSPITAL_COMMUNITY)
Admission: RE | Admit: 2018-09-25 | Discharge: 2018-09-25 | Disposition: A | Discharge status: Home / Self Care | Payer: BLUE CROSS/BLUE SHIELD | Source: Ambulatory Visit | Attending: Internal Medicine | Admitting: Internal Medicine

## 2018-09-25 ENCOUNTER — Ambulatory Visit (HOSPITAL_COMMUNITY): Payer: BLUE CROSS/BLUE SHIELD

## 2018-09-25 DIAGNOSIS — M25512 Pain in left shoulder: Secondary | ICD-10-CM | POA: Diagnosis not present

## 2018-09-25 DIAGNOSIS — M659 Synovitis and tenosynovitis, unspecified: Secondary | ICD-10-CM | POA: Insufficient documentation

## 2018-09-25 DIAGNOSIS — M7552 Bursitis of left shoulder: Secondary | ICD-10-CM | POA: Insufficient documentation

## 2018-09-25 MED ORDER — CEFAZOLIN SODIUM-DEXTROSE 2-4 GM/100ML-% IV SOLN
2.0000 g | INTRAVENOUS | Status: AC
  Administered 2018-09-26: 2 g via INTRAVENOUS

## 2018-09-26 ENCOUNTER — Ambulatory Visit
Admission: RE | Admit: 2018-09-26 | Discharge: 2018-09-26 | Disposition: A | Discharge status: Home / Self Care | Payer: BLUE CROSS/BLUE SHIELD | Source: Ambulatory Visit | Attending: Podiatry | Admitting: Podiatry

## 2018-09-26 ENCOUNTER — Encounter
Admission: RE | Disposition: A | Discharge status: Home / Self Care | Payer: Self-pay | Source: Ambulatory Visit | Attending: Podiatry

## 2018-09-26 ENCOUNTER — Ambulatory Visit: Payer: BLUE CROSS/BLUE SHIELD | Admitting: Registered Nurse

## 2018-09-26 ENCOUNTER — Other Ambulatory Visit: Payer: Self-pay

## 2018-09-26 DIAGNOSIS — G8918 Other acute postprocedural pain: Secondary | ICD-10-CM | POA: Diagnosis not present

## 2018-09-26 DIAGNOSIS — M2021 Hallux rigidus, right foot: Secondary | ICD-10-CM | POA: Diagnosis not present

## 2018-09-26 DIAGNOSIS — M898X7 Other specified disorders of bone, ankle and foot: Secondary | ICD-10-CM | POA: Insufficient documentation

## 2018-09-26 DIAGNOSIS — M65871 Other synovitis and tenosynovitis, right ankle and foot: Secondary | ICD-10-CM | POA: Insufficient documentation

## 2018-09-26 DIAGNOSIS — M76821 Posterior tibial tendinitis, right leg: Secondary | ICD-10-CM | POA: Insufficient documentation

## 2018-09-26 DIAGNOSIS — M9271 Juvenile osteochondrosis of metatarsus, right foot: Secondary | ICD-10-CM | POA: Insufficient documentation

## 2018-09-26 DIAGNOSIS — M25571 Pain in right ankle and joints of right foot: Secondary | ICD-10-CM | POA: Diagnosis not present

## 2018-09-26 DIAGNOSIS — S86191A Other injury of other muscle(s) and tendon(s) of posterior muscle group at lower leg level, right leg, initial encounter: Secondary | ICD-10-CM | POA: Diagnosis not present

## 2018-09-26 DIAGNOSIS — M216X1 Other acquired deformities of right foot: Secondary | ICD-10-CM | POA: Diagnosis not present

## 2018-09-26 DIAGNOSIS — J45909 Unspecified asthma, uncomplicated: Secondary | ICD-10-CM | POA: Diagnosis not present

## 2018-09-26 DIAGNOSIS — M19071 Primary osteoarthritis, right ankle and foot: Secondary | ICD-10-CM | POA: Insufficient documentation

## 2018-09-26 DIAGNOSIS — K219 Gastro-esophageal reflux disease without esophagitis: Secondary | ICD-10-CM | POA: Insufficient documentation

## 2018-09-26 DIAGNOSIS — M958 Other specified acquired deformities of musculoskeletal system: Secondary | ICD-10-CM | POA: Diagnosis not present

## 2018-09-26 HISTORY — PX: ANKLE ARTHROSCOPY: SHX545

## 2018-09-26 HISTORY — PX: EXOSTECTECTOMY TOE: SHX6618

## 2018-09-26 HISTORY — PX: TENOTOMY / FLEXOR TENDON TRANSFER: SHX6629

## 2018-09-26 HISTORY — PX: ARTHRODESIS METATARSALPHALANGEAL JOINT (MTPJ): SHX6566

## 2018-09-26 SURGERY — ARTHROSCOPY, ANKLE
Anesthesia: General | Site: Ankle | Laterality: Right

## 2018-09-26 MED ORDER — FENTANYL CITRATE (PF) 100 MCG/2ML IJ SOLN
25.0000 ug | INTRAMUSCULAR | Status: DC | PRN
  Administered 2018-09-26 (×5): 25 ug via INTRAVENOUS

## 2018-09-26 MED ORDER — FENTANYL CITRATE (PF) 100 MCG/2ML IJ SOLN
INTRAMUSCULAR | Status: DC | PRN
  Administered 2018-09-26 (×4): 25 ug via INTRAVENOUS
  Administered 2018-09-26: 50 ug via INTRAVENOUS
  Administered 2018-09-26: 25 ug via INTRAVENOUS

## 2018-09-26 MED ORDER — PROPOFOL 10 MG/ML IV BOLUS
INTRAVENOUS | Status: DC | PRN
  Administered 2018-09-26: 160 mg via INTRAVENOUS

## 2018-09-26 MED ORDER — HYDROCODONE-ACETAMINOPHEN 5-325 MG PO TABS: 2.0000 | ORAL_TABLET | Freq: Four times a day (QID) | ORAL | 0 refills | Status: AC | PRN

## 2018-09-26 MED ORDER — LIDOCAINE HCL (PF) 1 % IJ SOLN
INTRAMUSCULAR | Status: DC | PRN
  Administered 2018-09-26: 5 mL via SUBCUTANEOUS

## 2018-09-26 MED ORDER — FENTANYL CITRATE (PF) 100 MCG/2ML IJ SOLN
INTRAMUSCULAR | Status: AC
  Administered 2018-09-26: 50 ug via INTRAVENOUS
  Filled 2018-09-26: qty 2

## 2018-09-26 MED ORDER — LACTATED RINGERS IV SOLN
INTRAVENOUS | Status: DC
  Administered 2018-09-26: 08:00:00 via INTRAVENOUS
  Administered 2018-09-26: 1000 mL via INTRAVENOUS

## 2018-09-26 MED ORDER — SEVOFLURANE IN SOLN
RESPIRATORY_TRACT | Status: AC
  Filled 2018-09-26: qty 250

## 2018-09-26 MED ORDER — MIDAZOLAM HCL 2 MG/2ML IJ SOLN
INTRAMUSCULAR | Status: AC
  Administered 2018-09-26: 1 mg via INTRAVENOUS
  Filled 2018-09-26: qty 2

## 2018-09-26 MED ORDER — MIDAZOLAM HCL 2 MG/2ML IJ SOLN
1.0000 mg | Freq: Once | INTRAMUSCULAR | Status: AC
  Administered 2018-09-26: 1 mg via INTRAVENOUS

## 2018-09-26 MED ORDER — ACETAMINOPHEN 10 MG/ML IV SOLN
INTRAVENOUS | Status: AC
  Filled 2018-09-26: qty 100

## 2018-09-26 MED ORDER — ONDANSETRON HCL 4 MG/2ML IJ SOLN
INTRAMUSCULAR | Status: DC | PRN
  Administered 2018-09-26: 4 mg via INTRAVENOUS

## 2018-09-26 MED ORDER — ONDANSETRON HCL 4 MG/2ML IJ SOLN
4.0000 mg | Freq: Once | INTRAMUSCULAR | Status: AC | PRN
  Administered 2018-09-26: 4 mg via INTRAVENOUS

## 2018-09-26 MED ORDER — HYDROCODONE-ACETAMINOPHEN 5-325 MG PO TABS
2.0000 | ORAL_TABLET | Freq: Four times a day (QID) | ORAL | Status: DC | PRN
  Administered 2018-09-26: 1 via ORAL

## 2018-09-26 MED ORDER — LIDOCAINE HCL (PF) 1 % IJ SOLN
INTRAMUSCULAR | Status: AC
  Filled 2018-09-26: qty 5

## 2018-09-26 MED ORDER — LIDOCAINE HCL (CARDIAC) PF 100 MG/5ML IV SOSY
PREFILLED_SYRINGE | INTRAVENOUS | Status: DC | PRN
  Administered 2018-09-26: 100 mg via INTRAVENOUS

## 2018-09-26 MED ORDER — POVIDONE-IODINE 7.5 % EX SOLN: Freq: Once | CUTANEOUS | Status: DC

## 2018-09-26 MED ORDER — DEXAMETHASONE SODIUM PHOSPHATE 10 MG/ML IJ SOLN
INTRAMUSCULAR | Status: AC
  Filled 2018-09-26: qty 1

## 2018-09-26 MED ORDER — ROPIVACAINE HCL 5 MG/ML IJ SOLN
INTRAMUSCULAR | Status: AC
  Filled 2018-09-26: qty 30

## 2018-09-26 MED ORDER — ROCURONIUM BROMIDE 100 MG/10ML IV SOLN
INTRAVENOUS | Status: DC | PRN
  Administered 2018-09-26: 40 mg via INTRAVENOUS
  Administered 2018-09-26: 10 mg via INTRAVENOUS

## 2018-09-26 MED ORDER — BUPIVACAINE-EPINEPHRINE 0.25% -1:200000 IJ SOLN
INTRAMUSCULAR | Status: DC | PRN
  Administered 2018-09-26: 20 mL

## 2018-09-26 MED ORDER — SUGAMMADEX SODIUM 200 MG/2ML IV SOLN
INTRAVENOUS | Status: DC | PRN
  Administered 2018-09-26: 150 mg via INTRAVENOUS

## 2018-09-26 MED ORDER — LIDOCAINE HCL (PF) 2 % IJ SOLN
INTRAMUSCULAR | Status: AC
  Filled 2018-09-26: qty 10

## 2018-09-26 MED ORDER — FENTANYL CITRATE (PF) 100 MCG/2ML IJ SOLN
INTRAMUSCULAR | Status: AC
  Filled 2018-09-26: qty 2

## 2018-09-26 MED ORDER — PROPOFOL 10 MG/ML IV BOLUS
INTRAVENOUS | Status: AC
  Filled 2018-09-26: qty 20

## 2018-09-26 MED ORDER — MIDAZOLAM HCL 2 MG/2ML IJ SOLN
INTRAMUSCULAR | Status: DC | PRN
  Administered 2018-09-26: 2 mg via INTRAVENOUS

## 2018-09-26 MED ORDER — FENTANYL CITRATE (PF) 100 MCG/2ML IJ SOLN
50.0000 ug | Freq: Once | INTRAMUSCULAR | Status: AC
  Administered 2018-09-26: 50 ug via INTRAVENOUS

## 2018-09-26 MED ORDER — MIDAZOLAM HCL 2 MG/2ML IJ SOLN
INTRAMUSCULAR | Status: AC
  Filled 2018-09-26: qty 2

## 2018-09-26 MED ORDER — ROPIVACAINE HCL 5 MG/ML IJ SOLN
INTRAMUSCULAR | Status: DC | PRN
  Administered 2018-09-26: 30 mL via PERINEURAL

## 2018-09-26 MED ORDER — ONDANSETRON HCL 4 MG/2ML IJ SOLN
INTRAMUSCULAR | Status: AC
  Filled 2018-09-26: qty 2

## 2018-09-26 MED ORDER — ONDANSETRON HCL 4 MG/2ML IJ SOLN: 4.0000 mg | Freq: Four times a day (QID) | INTRAMUSCULAR | Status: DC | PRN

## 2018-09-26 MED ORDER — ALBUTEROL SULFATE HFA 108 (90 BASE) MCG/ACT IN AERS
INHALATION_SPRAY | RESPIRATORY_TRACT | Status: DC | PRN
  Administered 2018-09-26 (×2): 4 via RESPIRATORY_TRACT

## 2018-09-26 MED ORDER — ALBUTEROL SULFATE HFA 108 (90 BASE) MCG/ACT IN AERS
INHALATION_SPRAY | RESPIRATORY_TRACT | Status: AC
  Filled 2018-09-26: qty 6.7

## 2018-09-26 MED ORDER — ACETAMINOPHEN 10 MG/ML IV SOLN
INTRAVENOUS | Status: DC | PRN
  Administered 2018-09-26: 1000 mg via INTRAVENOUS

## 2018-09-26 MED ORDER — ONDANSETRON HCL 4 MG PO TABS: 4.0000 mg | ORAL_TABLET | Freq: Four times a day (QID) | ORAL | Status: DC | PRN

## 2018-09-26 MED ORDER — FENTANYL CITRATE (PF) 250 MCG/5ML IJ SOLN
INTRAMUSCULAR | Status: AC
  Filled 2018-09-26: qty 5

## 2018-09-26 MED ORDER — EPHEDRINE SULFATE 50 MG/ML IJ SOLN
INTRAMUSCULAR | Status: AC
  Filled 2018-09-26: qty 1

## 2018-09-26 MED ORDER — ROCURONIUM BROMIDE 50 MG/5ML IV SOLN
INTRAVENOUS | Status: AC
  Filled 2018-09-26: qty 1

## 2018-09-26 MED ORDER — DEXAMETHASONE SODIUM PHOSPHATE 10 MG/ML IJ SOLN
INTRAMUSCULAR | Status: DC | PRN
  Administered 2018-09-26: 10 mg via INTRAVENOUS

## 2018-09-26 SURGICAL SUPPLY — 110 items
17mm reamer male ×1 IMPLANT
19mm female reamer ×1 IMPLANT
19mm male reamer ×1 IMPLANT
ADAPTER IRRIG TUBE 2 SPIKE SOL (ADAPTER) ×4 IMPLANT
ADPR TBG 2 SPK PMP STRL ASCP (ADAPTER) ×2
ANCH SUT 1.8 MN Q-FX (Orthopedic Implant) ×1 IMPLANT
ANCHOR SUT 1.8 QFIX MINI (Orthopedic Implant) ×1 IMPLANT
ARTHROWAND PARAGON T2 (SURGICAL WAND) ×2
BANDAGE ELASTIC 4 LF NS (GAUZE/BANDAGES/DRESSINGS) ×4 IMPLANT
BASIN GRAD PLASTIC 32OZ STRL (MISCELLANEOUS) ×1 IMPLANT
BIT DRILL 2 FENESTRATED (MISCELLANEOUS) IMPLANT
BIT DRILL 2.4X140 LONG SOLID (BIT) ×1 IMPLANT
BIT DRILL CANNULTD 2.6 X 130MM (DRILL) IMPLANT
BIT DRILL SOLID 2.0 X 110MM (DRILL) IMPLANT
BIT DRILLL 2 FENESTRATED (MISCELLANEOUS) ×1
BLADE FULL RADIUS 2.9 (BLADE) ×1 IMPLANT
BLADE SURG 15 STRL LF DISP TIS (BLADE) ×2 IMPLANT
BLADE SURG 15 STRL SS (BLADE) ×4
BLADE SURG MINI STRL (BLADE) ×2 IMPLANT
BNDG CMPR 75X21 PLY HI ABS (MISCELLANEOUS) ×1
BNDG CMPR MED 5X4 ELC HKLP NS (GAUZE/BANDAGES/DRESSINGS) ×2
BNDG COHESIVE 4X5 TAN STRL (GAUZE/BANDAGES/DRESSINGS) ×2 IMPLANT
BNDG CONFORM 3 STRL LF (GAUZE/BANDAGES/DRESSINGS) ×2 IMPLANT
BNDG ESMARK 4X12 TAN STRL LF (GAUZE/BANDAGES/DRESSINGS) ×2 IMPLANT
BNDG GAUZE 4.5X4.1 6PLY STRL (MISCELLANEOUS) ×2 IMPLANT
BUR AGGRESSIVE+ 2.5 (BURR) IMPLANT
BURR SHAVER ABRADER 3.5 (BLADE) ×1 IMPLANT
CONE REAMER 19MM (MISCELLANEOUS) ×1 IMPLANT
COUNTERSICK 4.0 HEADED (MISCELLANEOUS) ×2
DRAPE FLUOR MINI C-ARM 54X84 (DRAPES) ×1 IMPLANT
DRAPE IMP U-DRAPE 54X76 (DRAPES) ×1 IMPLANT
DRILL CANNULATED 2.6 X 130MM (DRILL) ×2
DRILL SOLID 2.0 X 110MM (DRILL) ×2
DURAPREP 26ML APPLICATOR (WOUND CARE) ×2 IMPLANT
ELECT REM PT RETURN 9FT ADLT (ELECTROSURGICAL) ×2
ELECTRODE REM PT RTRN 9FT ADLT (ELECTROSURGICAL) ×1 IMPLANT
ETHIBOND 2 0 GREEN CT 2 30IN (SUTURE) ×2 IMPLANT
GAUZE PETRO XEROFOAM 1X8 (MISCELLANEOUS) ×2 IMPLANT
GAUZE SPONGE 4X4 12PLY STRL (GAUZE/BANDAGES/DRESSINGS) ×2 IMPLANT
GAUZE STRETCH 2X75IN STRL (MISCELLANEOUS) ×2 IMPLANT
GLOVE BIO SURGEON STRL SZ7.5 (GLOVE) ×2 IMPLANT
GLOVE INDICATOR 8.0 STRL GRN (GLOVE) ×2 IMPLANT
GOWN STRL REUS W/ TWL LRG LVL3 (GOWN DISPOSABLE) ×2 IMPLANT
GOWN STRL REUS W/TWL LRG LVL3 (GOWN DISPOSABLE) ×4
IV LACTATED RINGER IRRG 3000ML (IV SOLUTION) ×6
IV LR IRRIG 3000ML ARTHROMATIC (IV SOLUTION) ×5 IMPLANT
K-WIRE SMOOTH 1.6X150MM (WIRE) ×2
K-WIRE SNGL END 1.2X150 (MISCELLANEOUS) ×4
KIT ANCHOR SUT 1.8 Q-FIX MINI (KITS) ×1 IMPLANT
KIT TURNOVER KIT A (KITS) ×2 IMPLANT
KWIRE SMOOTH 1.6X150MM (WIRE) IMPLANT
KWIRE SNGL END 1.2X150 (MISCELLANEOUS) IMPLANT
LABEL OR SOLS (LABEL) ×2 IMPLANT
MANIFOLD NEPTUNE II (INSTRUMENTS) ×2 IMPLANT
NDL FILTER BLUNT 18X1 1/2 (NEEDLE) ×1 IMPLANT
NDL HYPO 25X1 1.5 SAFETY (NEEDLE) ×3 IMPLANT
NDL MAYO CATGUT SZ4 TPR NDL (NEEDLE) IMPLANT
NDL SAFETY ECLIPSE 18X1.5 (NEEDLE) ×1 IMPLANT
NEEDLE FILTER BLUNT 18X 1/2SAF (NEEDLE) ×1
NEEDLE FILTER BLUNT 18X1 1/2 (NEEDLE) ×1 IMPLANT
NEEDLE HYPO 18GX1.5 SHARP (NEEDLE) ×2
NEEDLE HYPO 25X1 1.5 SAFETY (NEEDLE) ×6 IMPLANT
NEEDLE MAYO CATGUT SZ4 (NEEDLE) ×2 IMPLANT
NS IRRIG 500ML POUR BTL (IV SOLUTION) ×2 IMPLANT
PACK ARTHROSCOPY KNEE (MISCELLANEOUS) ×2 IMPLANT
PACK EXTREMITY ARMC (MISCELLANEOUS) ×2 IMPLANT
PAD CAST CTTN 4X4 STRL (SOFTGOODS) ×1 IMPLANT
PADDING CAST COTTON 4X4 STRL (SOFTGOODS) ×2
PENCIL ELECTRO HAND CTR (MISCELLANEOUS) ×2 IMPLANT
PLATE MTP 0DEG RIGHT (Plate) ×1 IMPLANT
RASP SM TEAR CROSS CUT (RASP) ×1 IMPLANT
REAMER 17 (MISCELLANEOUS) ×1 IMPLANT
REAMER CUP 19MM FEMALE (MISCELLANEOUS) ×1 IMPLANT
SCREW 4.0 X 26 ST (Screw) ×1 IMPLANT
SCREW COUNTERSINK 4.0 HEADED (MISCELLANEOUS) IMPLANT
SCREW LOCK PLATE R3 2.7X10 (Screw) ×1 IMPLANT
SCREW LOCK PLATE R3 2.7X12 (Screw) ×1 IMPLANT
SCREW LOCK PLATE R3 2.7X14 (Screw) ×2 IMPLANT
SCREW LOCK PLATE R3 2.7X16 ×1 IMPLANT
SCREW NON LOCK 3.5X18 (Screw) ×1 IMPLANT
SET TUBE SUCT SHAVER OUTFL 24K (TUBING) ×2 IMPLANT
SET TUBE TIP INTRA-ARTICULAR (MISCELLANEOUS) ×2 IMPLANT
STOCKINETTE M/LG 89821 (MISCELLANEOUS) ×2 IMPLANT
STRAP ANKLE DISTRACTOR (MISCELLANEOUS) IMPLANT
STRAP SAFETY 5IN WIDE (MISCELLANEOUS) ×2 IMPLANT
STRIP CLOSURE SKIN 1/4X4 (GAUZE/BANDAGES/DRESSINGS) ×2 IMPLANT
SUT ETH BLK MONO 3 0 FS 1 12/B (SUTURE) ×2 IMPLANT
SUT ETHIBOND GREEN BRAID 0S 4 (SUTURE) ×8 IMPLANT
SUT ETHILON 4-0 (SUTURE) ×2
SUT ETHILON 4-0 FS2 18XMFL BLK (SUTURE) ×1
SUT MNCRL 4-0 (SUTURE) ×2
SUT MNCRL 4-0 27XMFL (SUTURE) ×1
SUT PDS II 3-0 (SUTURE) ×2 IMPLANT
SUT VIC AB 3-0 SH 27 (SUTURE) ×2
SUT VIC AB 3-0 SH 27X BRD (SUTURE) ×1 IMPLANT
SUT VIC AB 4-0 FS2 27 (SUTURE) ×2 IMPLANT
SUT VIC AB 4-0 SH 27 (SUTURE) ×2
SUT VIC AB 4-0 SH 27XANBCTRL (SUTURE) ×1 IMPLANT
SUTURE ETHLN 4-0 FS2 18XMF BLK (SUTURE) ×1 IMPLANT
SUTURE MNCRL 4-0 27XMF (SUTURE) IMPLANT
SYR 10ML LL (SYRINGE) ×4 IMPLANT
TOWEL OR 17X26 4PK STRL BLUE (TOWEL DISPOSABLE) ×1 IMPLANT
TUBING ARTHRO INFLOW-ONLY STRL (TUBING) ×2 IMPLANT
TUBING CONNECTING 10 (TUBING) ×2 IMPLANT
WAND ARTHRO PARAGON T2 (SURGICAL WAND) IMPLANT
WAND COVAC 50 IFS (MISCELLANEOUS) IMPLANT
WAND TOPAZ MICRO DEBRIDER (MISCELLANEOUS) ×1 IMPLANT
WIRE OLIVE SMOOTH 1.4MMX60MM (WIRE) ×2 IMPLANT
WIRE Z .062 C-WIRE SPADE TIP (WIRE) ×2 IMPLANT
nonlock screw 3.5x18 ×1 IMPLANT

## 2018-09-26 NOTE — H&P (Signed)
HISTORY AND PHYSICAL INTERVAL NOTE:  09/26/2018  7:24 AM  Valerie Welch  has presented today for surgery, with the diagnosis of Osteochondral defect of ankle Tibialis tendinitis-Right lower extremity Hallux rigidus-Right foot Freiberg's infraction - Right.  The various methods of treatment have been discussed with the patient.  No guarantees were given.  After consideration of risks, benefits and other options for treatment, the patient has consented to surgery.  I have reviewed the patients' chart and labs.     A history and physical examination was performed in my office.  The patient was reexamined.  There have been no changes to this history and physical examination.  [name] A

## 2018-09-26 NOTE — Anesthesia Procedure Notes (Signed)
Anesthesia Regional Block: Popliteal block   Pre-Anesthetic Checklist: ,, timeout performed, Correct Patient, Correct Site, Correct Laterality, Correct Procedure, Correct Position, site marked, Risks and benefits discussed,  Surgical consent,  Pre-op evaluation,  At surgeon's request and post-op pain management  Laterality: Lower and Right  Prep: chloraprep       Needles:  Injection technique: Single-shot  Needle Type: Echogenic Needle     Needle Length: 9cm  Needle Gauge: 21     Additional Needles:   Procedures:,,,, ultrasound used (permanent image in chart),,,,  Narrative:  Start time: 09/26/2018 7:27 AM End time: 09/26/2018 7:32 AM Injection made incrementally with aspirations every 5 mL.  Performed by: Personally  Anesthesiologist: Martha Clan, MD  Additional Notes: Functioning IV was confirmed and monitors were applied.  A echogenic needle was used. Sterile prep,hand hygiene and sterile gloves were used. Minimal sedation used for procedure.   No paresthesia endorsed by patient during the procedure.  Negative aspiration and negative test dose prior to incremental administration of local anesthetic. The patient tolerated the procedure well with no immediate complications.

## 2018-09-26 NOTE — Transfer of Care (Signed)
Immediate Anesthesia Transfer of Care Note  Patient: Valerie Welch  Procedure(s) Performed: ANKLE ARTHROSCOPY/OCD REPAIR  &  DEBRIDEMENT; EXTENSIVE (Right Ankle) FLEXOR TENDON TRANSFER; DEEP (Right Ankle) ARTHRODESIS METATARSALPHALANGEAL JOINT (MTPJ) FUSION (Right Ankle) EXOSTECTECTOMY TOE-2ND MTPJ (Right Ankle)  Patient Location: PACU  Anesthesia Type:General  Level of Consciousness: sedated  Airway & Oxygen Therapy: Patient Spontanous Breathing and Patient connected to face mask oxygen  Post-op Assessment: Report given to RN and Post -op Vital signs reviewed and stable  Post vital signs: Reviewed and stable  Last Vitals:  Vitals Value Taken Time  BP 142/89 09/26/2018 12:04 PM  Temp    Pulse 75 09/26/2018 12:11 PM  Resp 17 09/26/2018 12:11 PM  SpO2 100 % 09/26/2018 12:11 PM  Vitals shown include unvalidated device data.  Last Pain:  Vitals:   09/26/18 1204  TempSrc:   PainSc: (P) Asleep         Complications: No apparent anesthesia complications

## 2018-09-26 NOTE — Anesthesia Procedure Notes (Signed)
Procedure Name: Intubation Date/Time: 09/26/2018 7:47 AM Performed by: Allean Found, CRNA Pre-anesthesia Checklist: Patient identified, Patient being monitored, Timeout performed, Emergency Drugs available and Suction available Patient Re-evaluated:Patient Re-evaluated prior to induction Oxygen Delivery Method: Circle system utilized Preoxygenation: Pre-oxygenation with 100% oxygen Induction Type: IV induction Ventilation: Mask ventilation without difficulty Laryngoscope Size: Mac and 3 Grade View: Grade I Tube type: Oral Tube size: 7.0 mm Number of attempts: 1 Airway Equipment and Method: Stylet Placement Confirmation: ETT inserted through vocal cords under direct vision,  positive ETCO2 and breath sounds checked- equal and bilateral Secured at: 21 cm Tube secured with: Tape Dental Injury: Teeth and Oropharynx as per pre-operative assessment

## 2018-09-26 NOTE — Op Note (Signed)
Operative note   Surgeon:Callen Vancuren    Assistant:None    Preop diagnosis: 1.  Right ankle synovitis with exostosis right anterior ankle 2.  Osteochondral defect right medial shoulder talus 3.  Posterior tibial tendinitis 4.  Hallux rigidus right first MTPJ 5.  Arthritis right second MTPJ    Postop diagnosis: Same    Procedure:1.  Ankle arthroscopy with synovectomy and extensive debridement 2.  Osteochondral defect repair with microfracture right talus 3.  Posterior tibial tendon Tina synovectomy with flexor digitorum longus transfer 4.  Right first MTPJ arthrodesis 5.  Debridement right second MTPJ 6.  Intraoperative fluoroscopy all right ankle and foot    EBL: Minimal    Anesthesia:regional and general    Hemostasis: Epinephrine infiltrated along the incision site    Specimen: Posterior tibial tendinitis    Complications: None    Operative indications:Valerie Welch is an 47 y.o. that presents today for surgical intervention.  The risks/benefits/alternatives/complications have been discussed and consent has been given.    Procedure:  Patient was brought into the OR and placed on the operating table in thesupine position. After anesthesia was obtained theright lower extremity was prepped and draped in usual sterile fashion.  Attention was directed to the anterior aspect of the ankle joint where a small stab incision was made on the anteromedial anterolateral aspect.  Blunt dissection carried down into the joint.  The joint was then entered and visualized.  There was noted to be a large amount of synovitis and inflammatory tissue diffusely throughout the ankle joint.  With a small shaver I was able to remove large amounts of the ankle joint synovitis and scar tissue.  Extensive debridement was then performed.  Attention was then directed to the ankle joint itself where upon exploration the medial shoulder of the talus had a fairly large osteochondral lesion.  This extended on the  dorsal medial aspect of the talus and somewhat into the medial gutter.  Debridement of this was performed with a curette and shaver.  Microfracture of the central portion of the defect was then performed.  The wound was flushed with copious amounts of irrigation.  A small amount of bleeding was noted at the base of the microfracture site.  On the anterior aspect of the ankle joint there was a noted elastosis.  This was removed with a power burr and osteotome.  After final evaluation the arthroscopy was ended.   Attention was then directed to the medial aspect of the posterior tibial tendon where coursing from the medial malleolar region to the talonavicular joint and just anterior to the navicular incision was performed.  Sharp and blunt dissection carried down to the sheath of the posterior tibial tendon.  This was then entered.  The posterior tibial tendon was explored.  At its insertion there was noted tenosynovitis.  This was consistent with MRI findings of tenosynovitis at its insertion.  At this time this was debrided.  Given her symptoms and instability of the foot preoperatively decision was made to perform a flexor digitorum longus tendon transfer.  The flexor digitorum longus was then dissected out.  This was incised in the arch.  This was then implanted into the navicular region with a 1.8 mm Q fix suture.  This was sutured down with a Brunelle stitch and further reinforced with a 2-0 Ethibond.  This as well as 3-0 and 4-0 Vicryl.   Attention was then directed to the dorsal aspect of the first MTPJ where a dorsal incision  was performed.  Sharp and blunt dissection carried down to the capsule.  A longitudinal capsulotomy was performed.  This exposed the head of the metatarsal base of the proximal phalanx.  End-stage arthritis was noted with complete loss of cartilage in the first MTPJ.  The residual joint was then removed of all periarticular spurring.  This was removed with a power rasp and the joint  was then prepared with a cup and cone reamer.  This was taken down through the subchondral bone plate.  This was drilled with a 2.0 mm drill bit.  Toe was placed in a neutral position with slight dorsiflexion on the lateral view.  A 4.0 cannulated screw was driven from distal medial to proximal lateral.  A dorsal locking anatomic plate from [employer] 28 set was then placed.  Screw holes were filled with 2.7 mm solid screws.  The most proximal screw was a 3.5 mm compression screw.  Good alignment was noted in all planes With the use of fluoroscopy.  The wound was flushed with copious amounts of irrigation.  Standard closure was performed with a 3-0 and 4-0 Vicryl for the deeper and subtenons tissue.  Attention was then directed to the dorsal second MTPJ where a dorsal incision was made.  Sharp and blunt dissection down to the long extensor tendon.  This was reflected medially.  The capsule was then incised.  Exposing the metatarsophalangeal joint.  A focal defect on the anterior aspect of the metatarsal head was noted.  The articular spurring was noted and this was then debrided with a power rasp and rondure.  The defect was drilled with a 1.1 mm wire.  The wound was flushed with copious amounts of irrigation and closure was performed with a 3-0 and 4-0 Vicryl.  All skin incisions were closed with 4-0 Monocryl.  Bulky sterile dressing was then applied.  Patient will be placed in a equalizer walker boot postoperatively.  Referred hydrocodone was provided.   Patient tolerated the procedure and anesthesia well.  Was transported from the OR to the PACU with all vital signs stable and vascular status intact. To be discharged per routine protocol.  Will follow up in approximately 1 week in the outpatient clinic.

## 2018-09-26 NOTE — Anesthesia Post-op Follow-up Note (Signed)
Anesthesia QCDR form completed.        

## 2018-09-26 NOTE — Anesthesia Preprocedure Evaluation (Addendum)
Anesthesia Evaluation  Patient identified by MRN, date of birth, ID band Patient awake    Reviewed: Allergy & Precautions, H&P , NPO status , Patient's Chart, lab work & pertinent test results, reviewed documented beta blocker date and time   Airway Mallampati: II  TM Distance: >3 FB Neck ROM: full    Dental  (+) Teeth Intact   Pulmonary neg pulmonary ROS, asthma ,    Pulmonary exam normal        Cardiovascular Exercise Tolerance: Good negative cardio ROS Normal cardiovascular exam Rhythm:regular Rate:Normal     Neuro/Psych Complaining of left shoulder pain and radiating left arm pain. negative neurological ROS  negative psych ROS   GI/Hepatic negative GI ROS, Neg liver ROS, hiatal hernia, GERD  Medicated,  Endo/Other  negative endocrine ROSHypothyroidism   Renal/GU negative Renal ROS  negative genitourinary   Musculoskeletal   Abdominal   Peds  Hematology negative hematology ROS (+) anemia ,   Anesthesia Other Findings Past Medical History: No date: Anemia No date: Asthma No date: GERD (gastroesophageal reflux disease) No date: History of hiatal hernia No date: Hypothyroidism Past Surgical History: No date: CESAREAN SECTION     Comment:  x2 No date: CHOLECYSTECTOMY, LAPAROSCOPIC BMI    Body Mass Index:  33.11 kg/m     Reproductive/Obstetrics negative OB ROS                            Anesthesia Physical Anesthesia Plan  ASA: II  Anesthesia Plan: General ETT   Post-op Pain Management:  Regional for Post-op pain   Induction:   PONV Risk Score and Plan:   Airway Management Planned:   Additional Equipment:   Intra-op Plan:   Post-operative Plan:   Informed Consent: I have reviewed the patients History and Physical, chart, labs and discussed the procedure including the risks, benefits and alternatives for the proposed anesthesia with the patient or authorized  representative who has indicated his/her understanding and acceptance.   Dental Advisory Given  Plan Discussed with: CRNA  Anesthesia Plan Comments:         Anesthesia Quick Evaluation

## 2018-09-26 NOTE — OR Nursing (Signed)
Discharge instructions discussed with pt and husband. Both voice understanding. 

## 2018-09-26 NOTE — Discharge Instructions (Signed)
[facility] °[facility] ° °POST OPERATIVE INSTRUCTIONS FOR DR. TROXLER AND DR. FOWLER °[facility] ° ° °1. Take your medication as prescribed.  Pain medication should be taken only as needed. ° °2. Keep the dressing clean, dry and intact. ° °3. Keep your foot elevated above the heart level for the first 48 hours. ° °4. Walking to the bathroom and brief periods of walking are acceptable, unless we have instructed you to be non-weight bearing. ° °5. Always wear your post-op shoe when walking.  Always use your crutches if you are to be non-weight bearing. ° °6. Do not take a shower. Baths are permissible as long as the foot is kept out of the water.  ° °7. Every hour you are awake:  °- Bend your knee 15 times. °- Flex foot 15 times °- Massage calf 15 times ° °8. Call [facility] ([phone]) if any of the following problems occur: °- You develop a temperature or fever. °- The bandage becomes saturated with blood. °- Medication does not stop your pain. °- Injury of the foot occurs. °- Any symptoms of infection including redness, odor, or red streaks running from wound. ° ° ° ° °AMBULATORY SURGERY  °DISCHARGE INSTRUCTIONS ° ° °1) The drugs that you were given will stay in your system until tomorrow so for the next 24 hours you should not: ° °A) Drive an automobile °B) Make any legal decisions °C) Drink any alcoholic beverage ° ° °2) You may resume regular meals tomorrow.  Today it is better to start with liquids and gradually work up to solid foods. ° °You may eat anything you prefer, but it is better to start with liquids, then soup and crackers, and gradually work up to solid foods. ° ° °3) Please notify your doctor immediately if you have any unusual bleeding, trouble breathing, redness and pain at the surgery site, drainage, fever, or pain not relieved by medication. ° ° ° °4) Additional Instructions: ° ° ° ° ° ° ° °Please contact your physician with any  problems or Same Day Surgery at [phone], Monday through Friday 6 am to 4 pm, or [place] [facility] [place] [facility] number at [phone]. ° °

## 2018-09-29 ENCOUNTER — Encounter: Payer: Self-pay | Admitting: Podiatry

## 2018-09-30 LAB — SURGICAL PATHOLOGY

## 2018-10-01 NOTE — Anesthesia Postprocedure Evaluation (Signed)
Anesthesia Post Note  Patient: Valerie Welch  Procedure(s) Performed: ANKLE ARTHROSCOPY/OCD REPAIR  &  DEBRIDEMENT; EXTENSIVE (Right Ankle) FLEXOR TENDON TRANSFER; DEEP (Right Ankle) ARTHRODESIS METATARSALPHALANGEAL JOINT (MTPJ) FUSION (Right Ankle) EXOSTECTECTOMY TOE-2ND MTPJ (Right Ankle)  Patient location during evaluation: PACU Anesthesia Type: General Level of consciousness: awake and alert Pain management: pain level controlled Vital Signs Assessment: post-procedure vital signs reviewed and stable Respiratory status: spontaneous breathing, nonlabored ventilation, respiratory function stable and patient connected to nasal cannula oxygen Cardiovascular status: blood pressure returned to baseline and stable Postop Assessment: no apparent nausea or vomiting Anesthetic complications: no     Last Vitals:  Vitals:   09/26/18 1250 09/26/18 1315  BP: (!) 139/95 132/80  Pulse: 70 73  Resp:    Temp:  (!) 36.4 C  SpO2: 100% 100%    Last Pain:  Vitals:   09/29/18 0808  TempSrc:   PainSc: 9                  Molli Barrows

## 2018-10-02 DIAGNOSIS — M958 Other specified acquired deformities of musculoskeletal system: Secondary | ICD-10-CM | POA: Diagnosis not present

## 2018-10-10 DIAGNOSIS — R2689 Other abnormalities of gait and mobility: Secondary | ICD-10-CM | POA: Diagnosis not present

## 2018-10-13 DIAGNOSIS — M7552 Bursitis of left shoulder: Secondary | ICD-10-CM | POA: Diagnosis not present

## 2018-10-13 DIAGNOSIS — M7582 Other shoulder lesions, left shoulder: Secondary | ICD-10-CM | POA: Diagnosis not present

## 2018-10-23 DIAGNOSIS — M958 Other specified acquired deformities of musculoskeletal system: Secondary | ICD-10-CM | POA: Diagnosis not present

## 2018-11-10 DIAGNOSIS — R2689 Other abnormalities of gait and mobility: Secondary | ICD-10-CM | POA: Diagnosis not present

## 2018-11-27 DIAGNOSIS — M2021 Hallux rigidus, right foot: Secondary | ICD-10-CM | POA: Diagnosis not present

## 2018-12-10 DIAGNOSIS — R2689 Other abnormalities of gait and mobility: Secondary | ICD-10-CM | POA: Diagnosis not present

## 2018-12-11 DIAGNOSIS — M25571 Pain in right ankle and joints of right foot: Secondary | ICD-10-CM | POA: Diagnosis not present

## 2018-12-11 DIAGNOSIS — G8929 Other chronic pain: Secondary | ICD-10-CM | POA: Diagnosis not present

## 2018-12-15 ENCOUNTER — Encounter: Payer: Self-pay | Admitting: Obstetrics & Gynecology

## 2018-12-15 ENCOUNTER — Ambulatory Visit (INDEPENDENT_AMBULATORY_CARE_PROVIDER_SITE_OTHER): Payer: BLUE CROSS/BLUE SHIELD | Admitting: Obstetrics & Gynecology

## 2018-12-15 VITALS — BP 136/88 | Ht 61.0 in | Wt 176.0 lb

## 2018-12-15 DIAGNOSIS — E6609 Other obesity due to excess calories: Secondary | ICD-10-CM

## 2018-12-15 DIAGNOSIS — Z30431 Encounter for routine checking of intrauterine contraceptive device: Secondary | ICD-10-CM

## 2018-12-15 DIAGNOSIS — Z01419 Encounter for gynecological examination (general) (routine) without abnormal findings: Secondary | ICD-10-CM | POA: Diagnosis not present

## 2018-12-15 DIAGNOSIS — Z6833 Body mass index (BMI) 33.0-33.9, adult: Secondary | ICD-10-CM

## 2018-12-15 MED ORDER — NORETHINDRONE 0.35 MG PO TABS: 1.0000 | ORAL_TABLET | Freq: Every day | ORAL | 4 refills | Status: DC

## 2018-12-15 NOTE — Progress Notes (Signed)
Valerie Welch Feb 10, 1971 [id number]   History:    48 y.o. [id number] Married  RP:  Established patient presenting for annual gyn exam   HPI: Well on Mirena IUD x 01/2017.  Progestin-only pill to control BTB.  No pelvic pain.  No pain with occasional IC.  Husband has Bipolar disorder.  Urine/BMs wnl.  Breasts normal.  BMI increased to 33.25.  Recovering from Rt foot surgery 09/2018.  Health labs with Fam MD  Past medical history,surgical history, family history and social history were all reviewed and documented in the EPIC chart.  Gynecologic History No LMP recorded. (Menstrual status: IUD). Contraception: Mirena IUD x 01/2017 Last Pap: 11/2017. Results were: Negative/HPV HR neg Last mammogram: 01/2018. Results were: Probably benign.  Breast US 07/2018 Probably benign Bone Density: Never Colonoscopy: Never  Obstetric History OB History  Gravida Para Term Preterm AB Living  5 2     2 2   SAB TAB Ectopic Multiple Live Births  1            # Outcome Date GA Lbr Len/2nd Weight Sex Delivery Anes PTL Lv  5 Gravida           4 AB           3 SAB           2 Para           1 Para              ROS: A ROS was performed and pertinent positives and negatives are included in the history.  GENERAL: No fevers or chills. HEENT: No change in vision, no earache, sore throat or sinus congestion. NECK: No pain or stiffness. CARDIOVASCULAR: No chest pain or pressure. No palpitations. PULMONARY: No shortness of breath, cough or wheeze. GASTROINTESTINAL: No abdominal pain, nausea, vomiting or diarrhea, melena or bright red blood per rectum. GENITOURINARY: No urinary frequency, urgency, hesitancy or dysuria. MUSCULOSKELETAL: No joint or muscle pain, no back pain, no recent trauma. DERMATOLOGIC: No rash, no itching, no lesions. ENDOCRINE: No polyuria, polydipsia, no heat or cold intolerance. No recent change in weight. HEMATOLOGICAL: No anemia or easy bruising or bleeding. NEUROLOGIC: No headache,  seizures, numbness, tingling or weakness. PSYCHIATRIC: No depression, no loss of interest in normal activity or change in sleep pattern.     Exam:   BP 136/88   Ht 5\' 1"  (1.549 m)   Wt 176 lb (79.8 kg)   BMI 33.25 kg/m   Body mass index is 33.25 kg/m.  General appearance : Well developed well nourished female. No acute distress HEENT: Eyes: no retinal hemorrhage or exudates,  Neck supple, trachea midline, no carotid bruits, no thyroidmegaly Lungs: Clear to auscultation, no rhonchi or wheezes, or rib retractions  Heart: Regular rate and rhythm, no murmurs or gallops Breast:Examined in sitting and supine position were symmetrical in appearance, no palpable masses or tenderness,  no skin retraction, no nipple inversion, no nipple discharge, no skin discoloration, no axillary or supraclavicular lymphadenopathy Abdomen: no palpable masses or tenderness, no rebound or guarding Extremities: no edema or skin discoloration or tenderness  Pelvic: Vulva: Normal             Vagina: No gross lesions or discharge  Cervix: No gross lesions or discharge.  Strings visible.    Uterus  AV, normal size, shape and consistency, non-tender and mobile  Adnexa  Without masses or tenderness  Anus: Normal   Assessment/Plan:  47 y.o. female for  annual exam   1. Well female exam with routine gynecological exam Normal gynecologic exam.  Pap test was negative with negative high-risk HPV December 2018.  No indication to repeat this year.  Breast exam normal.  Will repeat a screening mammogram in February 2020.  Health labs with family physician.  2. Encounter for routine checking of intrauterine contraceptive device (IUD) Mirena IUD since February 2018 well-tolerated and in good position.  Will continue on the progestin only pill for breakthrough bleeding control.  3. Class 1 obesity due to excess calories without serious comorbidity with body mass index (BMI) of 33.0 to 33.9 in adult Recommend lower  calorie/carb diet such as [employer].  As patient's foot heals from surgery, recommend aerobic physical activities 5 times a week and weightlifting every 2 days.  Other orders - norethindrone (MICRONOR,CAMILA,ERRIN) 0.35 MG tablet; Take 1 tablet (0.35 mg total) by mouth at bedtime.  Princess Bruins MD, 8:56 AM 12/15/2018

## 2018-12-18 ENCOUNTER — Encounter: Payer: Self-pay | Admitting: Obstetrics & Gynecology

## 2018-12-18 NOTE — Patient Instructions (Signed)
1. Well female exam with routine gynecological exam Normal gynecologic exam.  Pap test was negative with negative high-risk HPV December 2018.  No indication to repeat this year.  Breast exam normal.  Will repeat a screening mammogram in February 2020.  Health labs with family physician.  2. Encounter for routine checking of intrauterine contraceptive device (IUD) Mirena IUD since February 2018 well-tolerated and in good position.  Will continue on the progestin only pill for breakthrough bleeding control.  3. Class 1 obesity due to excess calories without serious comorbidity with body mass index (BMI) of 33.0 to 33.9 in adult Recommend lower calorie/carb diet such as [employer].  As patient's foot heals from surgery, recommend aerobic physical activities 5 times a week and weightlifting every 2 days.  Other orders - norethindrone (MICRONOR,CAMILA,ERRIN) 0.35 MG tablet; Take 1 tablet (0.35 mg total) by mouth at bedtime.  Calise, it was a pleasure seeing you today!

## 2018-12-19 DIAGNOSIS — G8929 Other chronic pain: Secondary | ICD-10-CM | POA: Diagnosis not present

## 2018-12-19 DIAGNOSIS — M25571 Pain in right ankle and joints of right foot: Secondary | ICD-10-CM | POA: Diagnosis not present

## 2018-12-22 DIAGNOSIS — G8929 Other chronic pain: Secondary | ICD-10-CM | POA: Diagnosis not present

## 2018-12-22 DIAGNOSIS — M25571 Pain in right ankle and joints of right foot: Secondary | ICD-10-CM | POA: Diagnosis not present

## 2018-12-26 DIAGNOSIS — G8929 Other chronic pain: Secondary | ICD-10-CM | POA: Diagnosis not present

## 2018-12-26 DIAGNOSIS — M25571 Pain in right ankle and joints of right foot: Secondary | ICD-10-CM | POA: Diagnosis not present

## 2018-12-29 DIAGNOSIS — M25571 Pain in right ankle and joints of right foot: Secondary | ICD-10-CM | POA: Diagnosis not present

## 2018-12-29 DIAGNOSIS — G8929 Other chronic pain: Secondary | ICD-10-CM | POA: Diagnosis not present

## 2019-01-02 DIAGNOSIS — G8929 Other chronic pain: Secondary | ICD-10-CM | POA: Diagnosis not present

## 2019-01-02 DIAGNOSIS — M25571 Pain in right ankle and joints of right foot: Secondary | ICD-10-CM | POA: Diagnosis not present

## 2019-01-05 DIAGNOSIS — M25571 Pain in right ankle and joints of right foot: Secondary | ICD-10-CM | POA: Diagnosis not present

## 2019-01-05 DIAGNOSIS — G8929 Other chronic pain: Secondary | ICD-10-CM | POA: Diagnosis not present

## 2019-01-08 DIAGNOSIS — M958 Other specified acquired deformities of musculoskeletal system: Secondary | ICD-10-CM | POA: Diagnosis not present

## 2019-01-08 DIAGNOSIS — M76821 Posterior tibial tendinitis, right leg: Secondary | ICD-10-CM | POA: Diagnosis not present

## 2019-01-08 DIAGNOSIS — M2021 Hallux rigidus, right foot: Secondary | ICD-10-CM | POA: Diagnosis not present

## 2019-01-09 DIAGNOSIS — G8929 Other chronic pain: Secondary | ICD-10-CM | POA: Diagnosis not present

## 2019-01-09 DIAGNOSIS — M25571 Pain in right ankle and joints of right foot: Secondary | ICD-10-CM | POA: Diagnosis not present

## 2019-01-12 DIAGNOSIS — J4520 Mild intermittent asthma, uncomplicated: Secondary | ICD-10-CM | POA: Diagnosis not present

## 2019-01-12 DIAGNOSIS — M958 Other specified acquired deformities of musculoskeletal system: Secondary | ICD-10-CM | POA: Diagnosis not present

## 2019-01-12 DIAGNOSIS — M25512 Pain in left shoulder: Secondary | ICD-10-CM | POA: Diagnosis not present

## 2019-01-12 DIAGNOSIS — E039 Hypothyroidism, unspecified: Secondary | ICD-10-CM | POA: Diagnosis not present

## 2019-01-12 DIAGNOSIS — M25571 Pain in right ankle and joints of right foot: Secondary | ICD-10-CM | POA: Diagnosis not present

## 2019-01-12 DIAGNOSIS — G8929 Other chronic pain: Secondary | ICD-10-CM | POA: Diagnosis not present

## 2019-01-12 DIAGNOSIS — F411 Generalized anxiety disorder: Secondary | ICD-10-CM | POA: Diagnosis not present

## 2019-01-16 DIAGNOSIS — G8929 Other chronic pain: Secondary | ICD-10-CM | POA: Diagnosis not present

## 2019-01-16 DIAGNOSIS — Z9889 Other specified postprocedural states: Secondary | ICD-10-CM | POA: Insufficient documentation

## 2019-01-16 DIAGNOSIS — F411 Generalized anxiety disorder: Secondary | ICD-10-CM | POA: Diagnosis not present

## 2019-01-16 DIAGNOSIS — E039 Hypothyroidism, unspecified: Secondary | ICD-10-CM | POA: Diagnosis not present

## 2019-01-16 DIAGNOSIS — M25571 Pain in right ankle and joints of right foot: Secondary | ICD-10-CM | POA: Diagnosis not present

## 2019-01-16 DIAGNOSIS — D509 Iron deficiency anemia, unspecified: Secondary | ICD-10-CM | POA: Diagnosis not present

## 2019-01-16 DIAGNOSIS — M79671 Pain in right foot: Secondary | ICD-10-CM | POA: Diagnosis not present

## 2019-01-16 DIAGNOSIS — J4520 Mild intermittent asthma, uncomplicated: Secondary | ICD-10-CM | POA: Diagnosis not present

## 2019-01-20 DIAGNOSIS — G8929 Other chronic pain: Secondary | ICD-10-CM | POA: Diagnosis not present

## 2019-01-20 DIAGNOSIS — M25571 Pain in right ankle and joints of right foot: Secondary | ICD-10-CM | POA: Diagnosis not present

## 2019-01-23 DIAGNOSIS — M25571 Pain in right ankle and joints of right foot: Secondary | ICD-10-CM | POA: Diagnosis not present

## 2019-01-23 DIAGNOSIS — G8929 Other chronic pain: Secondary | ICD-10-CM | POA: Diagnosis not present

## 2019-01-27 DIAGNOSIS — M25571 Pain in right ankle and joints of right foot: Secondary | ICD-10-CM | POA: Diagnosis not present

## 2019-01-27 DIAGNOSIS — G8929 Other chronic pain: Secondary | ICD-10-CM | POA: Diagnosis not present

## 2019-01-30 DIAGNOSIS — M25571 Pain in right ankle and joints of right foot: Secondary | ICD-10-CM | POA: Diagnosis not present

## 2019-01-30 DIAGNOSIS — G8929 Other chronic pain: Secondary | ICD-10-CM | POA: Diagnosis not present

## 2019-02-02 DIAGNOSIS — G8929 Other chronic pain: Secondary | ICD-10-CM | POA: Diagnosis not present

## 2019-02-02 DIAGNOSIS — M25571 Pain in right ankle and joints of right foot: Secondary | ICD-10-CM | POA: Diagnosis not present

## 2019-02-05 DIAGNOSIS — G8929 Other chronic pain: Secondary | ICD-10-CM | POA: Diagnosis not present

## 2019-02-05 DIAGNOSIS — M25571 Pain in right ankle and joints of right foot: Secondary | ICD-10-CM | POA: Diagnosis not present

## 2019-02-10 DIAGNOSIS — M25571 Pain in right ankle and joints of right foot: Secondary | ICD-10-CM | POA: Diagnosis not present

## 2019-02-10 DIAGNOSIS — G8929 Other chronic pain: Secondary | ICD-10-CM | POA: Diagnosis not present

## 2019-02-12 ENCOUNTER — Encounter: Payer: Self-pay | Admitting: Student in an Organized Health Care Education/Training Program

## 2019-02-12 ENCOUNTER — Other Ambulatory Visit: Payer: Self-pay

## 2019-02-12 ENCOUNTER — Ambulatory Visit
Admission: RE | Admit: 2019-02-12 | Discharge: 2019-02-12 | Disposition: A | Discharge status: Home / Self Care | Payer: BLUE CROSS/BLUE SHIELD | Source: Ambulatory Visit | Attending: Obstetrics & Gynecology | Admitting: Obstetrics & Gynecology

## 2019-02-12 ENCOUNTER — Ambulatory Visit
Payer: BLUE CROSS/BLUE SHIELD | Attending: Student in an Organized Health Care Education/Training Program | Admitting: Student in an Organized Health Care Education/Training Program

## 2019-02-12 VITALS — BP 137/98 | HR 72 | Temp 99.0°F | Resp 18 | Ht 61.0 in | Wt 176.0 lb

## 2019-02-12 DIAGNOSIS — F411 Generalized anxiety disorder: Secondary | ICD-10-CM

## 2019-02-12 DIAGNOSIS — M792 Neuralgia and neuritis, unspecified: Secondary | ICD-10-CM | POA: Diagnosis not present

## 2019-02-12 DIAGNOSIS — Z9889 Other specified postprocedural states: Secondary | ICD-10-CM | POA: Insufficient documentation

## 2019-02-12 DIAGNOSIS — G5771 Causalgia of right lower limb: Secondary | ICD-10-CM | POA: Diagnosis not present

## 2019-02-12 DIAGNOSIS — G5793 Unspecified mononeuropathy of bilateral lower limbs: Secondary | ICD-10-CM | POA: Insufficient documentation

## 2019-02-12 DIAGNOSIS — N6310 Unspecified lump in the right breast, unspecified quadrant: Secondary | ICD-10-CM

## 2019-02-12 DIAGNOSIS — G894 Chronic pain syndrome: Secondary | ICD-10-CM | POA: Insufficient documentation

## 2019-02-12 DIAGNOSIS — R922 Inconclusive mammogram: Secondary | ICD-10-CM | POA: Diagnosis not present

## 2019-02-12 MED ORDER — DULOXETINE HCL 30 MG PO CPEP: ORAL_CAPSULE | ORAL | 2 refills | Status: DC

## 2019-02-12 MED ORDER — GABAPENTIN 300 MG PO CAPS: ORAL_CAPSULE | ORAL | 2 refills | Status: DC

## 2019-02-12 NOTE — Progress Notes (Signed)
Safety precautions to be maintained throughout the outpatient stay will include: orient to surroundings, keep bed in low position, maintain call bell within reach at all times, provide assistance with transfer out of bed and ambulation.  

## 2019-02-12 NOTE — Progress Notes (Signed)
Patient's Name: Valerie Welch  MRN: [id number]  Referring Provider: [name], DPM  DOB: [birth year]  PCP: Tracie Harrier, MD  DOS: 02/12/2019  Note by: Gillis Santa, MD  Service setting: Ambulatory outpatient  Specialty: Interventional Pain Management  Location: [facility] (AMB) Pain Management Facility  Visit type: Initial Patient Evaluation  Patient type: New Patient   Primary Reason(s) for Visit: Encounter for initial evaluation of one or more chronic problems (new to examiner) potentially causing chronic pain, and posing a threat to normal musculoskeletal function. (Level of risk: High) CC: Foot Pain (right)  HPI  Valerie Welch is a 48 y.o. year old, female patient, who comes today to see [place] for the first time for an initial evaluation of her chronic pain. She has History of foot surgery; Generalized anxiety disorder; and Neuropathic pain of both legs on their problem list. Today she comes in for evaluation of her Foot Pain (right)  Pain Assessment: Location: Right Foot Radiating: right leg Onset: More than a month ago Duration: Chronic pain, Neuropathic pain Quality: Burning, Sharp Severity: 5 /10 (subjective, self-reported pain score)  Note: Reported level is inconsistent with clinical observations.                         When using our objective Pain Scale, levels between 6 and 10/10 are said to belong in an emergency room, as it progressively worsens from a 6/10, described as severely limiting, requiring emergency care not usually available at an outpatient pain management facility. At a 6/10 level, communication becomes difficult and requires great effort. Assistance to reach the emergency department may be required. Facial flushing and profuse sweating along with potentially dangerous increases in heart rate and blood pressure will be evident. Effect on ADL: difficulty walking and performing daily activities, difficulty sleeping Timing: Intermittent Modifying factors:  Vicodin, Ibuprofen, topical cream, Tylenol, Neurontin,ice, heat BP: (!) 137/98  HR: 72  Onset and Duration: Date of onset: 09/26/2018 Cause of pain: Unknown Severity: NAS-11 now: 5/10 Timing: Morning, Afternoon, During activity or exercise and After activity or exercise Aggravating Factors: Climbing, Lifiting, Prolonged sitting, Prolonged standing, Surgery made it worse, Walking, Walking uphill and Walking downhill Alleviating Factors: Cold packs, Lying down, Medications, Resting, Sleeping and Warm showers or baths Associated Problems: Color changes, Sadness, Temperature changes, Tingling and Pain that wakes patient up Quality of Pain: Burning, Deep, Hot, Sharp, Shooting, Splitting, Stabbing, Throbbing, Tingling and Tiring Previous Examinations or Tests: MRI scan and X-rays Previous Treatments: Narcotic medications, Physical Therapy and Stretching exercises  The patient comes into the clinics today for the first time for a chronic pain management evaluation.   48 year old female with a history of right osteochondral defect resulting in chronic right ankle and foot pain status post Left first MTP joint fusion, posterior tibial repair and ankle arthroscopic debridement on 09/26/2018.  Patient has been working with outpatient physical therapy since her surgery.  She endorses severe pain of her right ankle and right foot, swelling, discoloration, differences in hair growth, differences in nail growth, increased sensitivity.  She is currently on gabapentin 300 mg twice a day with limited benefit.  She states that she does have a family history of CRPS.  Patient is tried hydrocodone 5 mg as well as oxycodone 5 mg in the past without any significant benefit.  She is currently not on chronic opioid therapy.  Today I took the time to provide the patient with information regarding my pain practice. The patient was  informed that my practice is divided into two sections: an interventional pain management  section, as well as a completely separate and distinct medication management section. I explained that I have procedure days for my interventional therapies, and evaluation days for follow-ups and medication management. Because of the amount of documentation required during both, they are kept separated. This means that there is the possibility that she may be scheduled for a procedure on one day, and medication management the next. I have also informed her that because of staffing and facility limitations, I no longer take patients for medication management only. To illustrate the reasons for this, I gave the patient the example of surgeons, and how inappropriate it would be to refer a patient to his/her care, just to write for the post-surgical antibiotics on a surgery done by a different surgeon.   Because interventional pain management is my board-certified specialty, the patient was informed that joining my practice means that they are open to any and all interventional therapies. I made it clear that this does not mean that they will be forced to have any procedures done. What this means is that I believe interventional therapies to be essential part of the diagnosis and proper management of chronic pain conditions. Therefore, patients not interested in these interventional alternatives will be better served under the care of a different practitioner.  The patient was also made aware of my Comprehensive Pain Management Safety Guidelines where by joining my practice, they limit all of their nerve blocks and joint injections to those done by our practice, for as long as we are retained to manage their care.   Historic Controlled Substance Pharmacotherapy Review   01/20/2019  1   01/20/2019  Hydrocodone-Acetamin 5-300 MG  30.00 30 Vi [place]   [id number]   [place] (0290)   0  5.00 MME  Comm Ins   [place]      Historical Monitoring: The patient  reports no history of drug use. List of all UDS Test(s): No results  found for: MDMA, COCAINSCRNUR, [place], [place], CANNABQUANT, [place], [place] List of other Serum/Urine Drug Screening Test(s):  No results found for: AMPHSCRSER, BARBSCRSER, BENZOSCRSER, COCAINSCRSER, COCAINSCRNUR, PCPSCRSER, PCPQUANT, THCSCRSER, THCU, CANNABQUANT, OPIATESCRSER, OXYSCRSER, PROPOXSCRSER, ETH Historical Background Evaluation: [place] PMP: Six (6) year initial data search conducted.              [place] Department of public safety, offender search: Editor, commissioning Information) Non-contributory Risk Assessment Profile: Aberrant behavior: None observed or detected today Risk factors for fatal opioid overdose: None identified today Fatal overdose hazard ratio (HR): Calculation deferred Non-fatal overdose hazard ratio (HR): Calculation deferred Risk of opioid abuse or dependence: 0.7-3.0% with doses ? 36 MME/day and 6.1-26% with doses ? 120 MME/day. Substance use disorder (SUD) risk level: See below Personal History of Substance Abuse (SUD-Substance use disorder):  Alcohol: Negative  Illegal Drugs: Negative  Rx Drugs: Negative  ORT Risk Level calculation: Low Risk Opioid Risk Tool - 02/12/19 1221      Family History of Substance Abuse   Alcohol  Positive Female    Illegal Drugs  Positive Female    Rx Drugs  Negative      Personal History of Substance Abuse   Alcohol  Negative    Illegal Drugs  Negative    Rx Drugs  Negative      Age   Age between 45-45 years   No      History of Preadolescent Sexual Abuse   History of Preadolescent Sexual  Abuse  Negative or Female      Psychological Disease   Psychological Disease  Negative    Depression  Negative      Total Score   Opioid Risk Tool Scoring  3    Opioid Risk Interpretation  Low Risk      ORT Scoring interpretation table:  Score <3 = Low Risk for SUD  Score between 4-7 = Moderate Risk for SUD  Score >8 = High Risk for Opioid Abuse   PHQ-2 Depression Scale:  Total score: 0  PHQ-2 Scoring interpretation table: (Score and  probability of major depressive disorder)  Score 0 = No depression  Score 1 = 15.4% Probability  Score 2 = 21.1% Probability  Score 3 = 38.4% Probability  Score 4 = 45.5% Probability  Score 5 = 56.4% Probability  Score 6 = 78.6% Probability   PHQ-9 Depression Scale:  Total score: 0  PHQ-9 Scoring interpretation table:  Score 0-4 = No depression  Score 5-9 = Mild depression  Score 10-14 = Moderate depression  Score 15-19 = Moderately severe depression  Score 20-27 = Severe depression (2.4 times higher risk of SUD and 2.89 times higher risk of overuse)   Pharmacologic Plan: As per protocol, I have not taken over any controlled substance management, pending the results of ordered tests and/or consults.            Initial impression: Pending review of available data and ordered tests.  Meds   Current Outpatient Medications:  .  acetaminophen (TYLENOL) 500 MG tablet, Take 1,000 mg by mouth daily as needed for moderate pain or headache., Disp: , Rfl:  .  ALPRAZolam (XANAX) 0.25 MG tablet, Take 0.25 mg by mouth at bedtime as needed for anxiety or sleep. , Disp: , Rfl:  .  azelastine (ASTELIN) 0.1 % nasal spray, Place 1 spray into both nostrils daily as needed for allergies. , Disp: , Rfl:  .  buPROPion (WELLBUTRIN SR) 150 MG 12 hr tablet, Take 150 mg by mouth 2 (two) times daily., Disp: , Rfl:  .  Cyanocobalamin (B-12) 1000 MCG/ML KIT, Inject 1,000 mcg as directed as needed (low b12 levels). , Disp: , Rfl:  .  diclofenac sodium (VOLTAREN) 1 % GEL, Apply 1 application topically at bedtime., Disp: , Rfl:  .  gabapentin (NEURONTIN) 300 MG capsule, 300 mg in the morning, 300 mg in the afternoon, 600 mg nightly, Disp: 120 capsule, Rfl: 2 .  HYDROcodone-acetaminophen (NORCO/VICODIN) 5-325 MG tablet, Take 2 tablets by mouth every 6 (six) hours as needed for moderate pain., Disp: 30 tablet, Rfl: 0 .  ibuprofen (ADVIL,MOTRIN) 800 MG tablet, Take 800 mg by mouth daily as needed for moderate pain.,  Disp: , Rfl:  .  iron polysaccharides (NIFEREX) 150 MG capsule, Take 150 mg by mouth at bedtime., Disp: , Rfl:  .  levothyroxine (SYNTHROID, LEVOTHROID) 25 MCG tablet, Take 25 mcg by mouth daily before breakfast., Disp: , Rfl:  .  Melatonin 5 MG CAPS, Take 5 mg by mouth at bedtime as needed (sleep)., Disp: , Rfl:  .  Multiple Vitamin (MULTIVITAMIN WITH MINERALS) TABS tablet, Take 1 tablet by mouth daily at 12 noon., Disp: , Rfl:  .  norethindrone (MICRONOR,CAMILA,ERRIN) 0.35 MG tablet, Take 1 tablet (0.35 mg total) by mouth at bedtime., Disp: 3 Package, Rfl: 4 .  omeprazole (PRILOSEC) 20 MG capsule, Take 20 mg by mouth daily., Disp: , Rfl:  .  PAZEO 0.7 % SOLN, Place 1 drop into both eyes  daily as needed for allergies., Disp: , Rfl: 5 .  rosuvastatin (CRESTOR) 5 MG tablet, Take 5 mg by mouth at bedtime., Disp: , Rfl:  .  trolamine salicylate (ASPERCREME) 10 % cream, Apply 1 application topically as needed for muscle pain., Disp: , Rfl:  .  DULoxetine (CYMBALTA) 30 MG capsule, 30 mg daily for 1 month and if no side effects increase to 60 mg daily, Disp: 60 capsule, Rfl: 2  Imaging Review    Shoulder-L MR wo contrast:  Results for orders placed during the hospital encounter of 09/25/18  MR SHOULDER LEFT WO CONTRAST   Narrative CLINICAL DATA:  Left shoulder pain since June  EXAM: MRI OF THE LEFT SHOULDER WITHOUT CONTRAST  TECHNIQUE: Multiplanar, multisequence MR imaging of the shoulder was performed. No intravenous contrast was administered.  COMPARISON:  None.  FINDINGS: Rotator cuff:  Mild distal supraspinatus tendinopathy.  Muscles:  Unremarkable  Biceps long head:  Unremarkable  Acromioclavicular Joint: No significant arthropathy. Type III acromion. Subacromial subdeltoid bursitis with mild synovitis.  Glenohumeral Joint: Unremarkable  Labrum:  Grossly unremarkable  Bones: No significant extra-articular osseous abnormalities identified.  Other: No supplemental  non-categorized findings.  IMPRESSION: 1. Subacromial subdeltoid bursitis with mild synovitis. 2. Mild distal supraspinatus tendinopathy. 3.  The acromial undersurface is type 3 (hooked).   Electronically Signed   By: Van Clines M.D.   On: 09/26/2018 09:13     Complexity Note: Imaging results reviewed. Results shared with Ms. Vari, using Layman's terms.                         ROS  Cardiovascular: No reported cardiovascular signs or symptoms such as High blood pressure, coronary artery disease, abnormal heart rate or rhythm, heart attack, blood thinner therapy or heart weakness and/or failure Pulmonary or Respiratory: Wheezing and difficulty taking a deep full breath (Asthma) Neurological: No reported neurological signs or symptoms such as seizures, abnormal skin sensations, urinary and/or fecal incontinence, being born with an abnormal open spine and/or a tethered spinal cord Review of Past Neurological Studies: No results found for this or any previous visit. Psychological-Psychiatric: Anxiousness and Difficulty sleeping and or falling asleep Gastrointestinal: Barrett's Disease Genitourinary: No reported renal or genitourinary signs or symptoms such as difficulty voiding or producing urine, peeing blood, non-functioning kidney, kidney stones, difficulty emptying the bladder, difficulty controlling the flow of urine, or chronic kidney disease Hematological: Brusing easily Endocrine: Thyroid Disease Rheumatologic: Joint aches and or swelling due to excess weight (Osteoarthritis) Musculoskeletal: Negative for myasthenia gravis, muscular dystrophy, multiple sclerosis or malignant hyperthermia Work History: Out of work due to pain and Quit going to work on his/her own  Allergies  Valerie Welch is allergic to beef-derived products; other; percocet [oxycodone-acetaminophen]; and pitocin [oxytocin].   PFSH  Drug: Ms. Ferraiolo  reports no history of drug  use. Alcohol:  reports current alcohol use of about 3.0 standard drinks of alcohol per week. Tobacco:  reports that she has never smoked. She has never used smokeless tobacco. Medical:  has a past medical history of Anemia, Asthma, Barrett's esophagus, Bursitis, GERD (gastroesophageal reflux disease), History of hiatal hernia, and Hypothyroidism. Family: family history includes Cancer in her paternal grandfather; Diabetes in her mother; Heart disease in her mother; Hypertension in her maternal grandfather; Stroke in her brother, father, and mother.  Past Surgical History:  Procedure Laterality Date  . ANKLE ARTHROSCOPY Right 09/26/2018   Procedure: ANKLE ARTHROSCOPY/OCD REPAIR  &  DEBRIDEMENT; EXTENSIVE;  Surgeon: [name], DPM;  Location: [facility] OR[facility];  Service: Podiatry;  Laterality: Right;  . ARTHRODESIS METATARSALPHALANGEAL JOINT (MTPJ) Right 09/26/2018   Procedure: ARTHRODESIS METATARSALPHALANGEAL JOINT (MTPJ) FUSION;  Surgeon: [name], DPM;  Location: [facility] OR[facility];  Service: Podiatry;  Laterality: Right;  . CESAREAN SECTION     x2  . CHOLECYSTECTOMY, LAPAROSCOPIC    . EXOSTECTECTOMY TOE Right 09/26/2018   Procedure: EXOSTECTECTOMY TOE-2ND MTPJ;  Surgeon: [name], DPM;  Location: [facility] OR[facility];  Service: Podiatry;  Laterality: Right;  . TENOTOMY / FLEXOR TENDON TRANSFER Right 09/26/2018   Procedure: FLEXOR TENDON TRANSFER; DEEP;  Surgeon: [name], DPM;  Location: [facility] OR[facility];  Service: Podiatry;  Laterality: Right;   Active Ambulatory Problems    Diagnosis Date Noted  . History of foot surgery 01/16/2019  . Generalized anxiety disorder 11/04/2014  . Neuropathic pain of both legs 03/07/2015   Resolved Ambulatory Problems    Diagnosis Date Noted  . No Resolved Ambulatory Problems   Past Medical History:  Diagnosis Date  . Anemia   . Asthma   . Barrett's esophagus   . Bursitis   . GERD (gastroesophageal reflux disease)   . History of hiatal hernia   . Hypothyroidism     Constitutional Exam  General appearance: Well nourished, well developed, and well hydrated. In no apparent acute distress Vitals:   02/12/19 1209  BP: (!) 137/98  Pulse: 72  Resp: 18  Temp: 99 F (37.2 C)  TempSrc: Oral  SpO2: 100%  Weight: 176 lb (79.8 kg)  Height: '5\' 1"'  (1.549 m)   BMI Assessment: Estimated body mass index is 33.25 kg/m as calculated from the following:   Height as of this encounter: '5\' 1"'  (1.549 m).   Weight as of this encounter: 176 lb (79.8 kg).  BMI interpretation table: BMI level Category Range association with higher incidence of chronic pain  <18 kg/m2 Underweight   18.5-24.9 kg/m2 Ideal body weight   25-29.9 kg/m2 Overweight Increased incidence by 20%  30-34.9 kg/m2 Obese (Class I) Increased incidence by 68%  35-39.9 kg/m2 Severe obesity (Class II) Increased incidence by 136%  >40 kg/m2 Extreme obesity (Class III) Increased incidence by 254%   Patient's current BMI Ideal Body weight  Body mass index is 33.25 kg/m. Ideal body weight: 47.8 kg (105 lb 6.1 oz) Adjusted ideal body weight: 60.6 kg (133 lb 10 oz)   BMI Readings from Last 4 Encounters:  02/12/19 33.25 kg/m  12/15/18 33.25 kg/m  09/26/18 33.11 kg/m  09/09/18 32.90 kg/m   Wt Readings from Last 4 Encounters:  02/12/19 176 lb (79.8 kg)  12/15/18 176 lb (79.8 kg)  09/26/18 175 lb 4 oz (79.5 kg)  09/09/18 174 lb 1.6 oz (79 kg)  Psych/Mental status: Alert, oriented x 3 (person, place, & time)       Eyes: PERLA Respiratory: No evidence of acute respiratory distress  Cervical Spine Area Exam  Skin & Axial Inspection: No masses, redness, edema, swelling, or associated skin lesions Alignment: Symmetrical Functional ROM: Unrestricted ROM      Stability: No instability detected Muscle Tone/Strength: Functionally intact. No obvious neuro-muscular anomalies detected. Sensory (Neurological): Unimpaired Palpation: No palpable anomalies               Lumbar Spine Area Exam  Skin &  Axial Inspection: No masses, redness, or swelling Alignment: Symmetrical Functional ROM: Unrestricted ROM       Stability: No instability detected Muscle Tone/Strength: Functionally intact. No obvious neuro-muscular anomalies detected. Sensory (Neurological):  Unimpaired Palpation: No palpable anomalies       Provocative Tests: Hyperextension/rotation test: deferred today       Lumbar quadrant test (Kemp's test): deferred today       Lateral bending test: deferred today       Patrick's Maneuver: deferred today                   FABER* test: deferred today                   S-I anterior distraction/compression test: deferred today         S-I lateral compression test: deferred today         S-I Thigh-thrust test: deferred today         S-I Gaenslen's test: deferred today         *(Flexion, ABduction and External Rotation)  Gait & Posture Assessment  Ambulation: Limited Gait: Antalgic Posture: WNL   Lower Extremity Exam    Side: Right lower extremity  Side: Left lower extremity  Stability: No instability observed          Stability: No instability observed          Skin & Extremity Inspection: Erythema, edema, allodynia of right foot  Skin & Extremity Inspection: Skin color, temperature, and hair growth are WNL. No peripheral edema or cyanosis. No masses, redness, swelling, asymmetry, or associated skin lesions. No contractures.  Functional ROM: Pain restricted ROM                  Functional ROM: Unrestricted ROM                  Muscle Tone/Strength: Functionally intact. No obvious neuro-muscular anomalies detected.  Muscle Tone/Strength: Functionally intact. No obvious neuro-muscular anomalies detected.  Sensory (Neurological): Neurogenic pain pattern        Sensory (Neurological): Unimpaired        DTR: Patellar: 2+: normal Achilles: 1+: trace Plantar: deferred today  DTR: Patellar: 2+: normal Achilles: 2+: normal Plantar: deferred today  Palpation: Complains of area being  tender to palpation  Palpation: No palpable anomalies   Assessment  Primary Diagnosis & Pertinent Problem List: The primary encounter diagnosis was Complex regional pain syndrome type II of right lower limb. Diagnoses of Neuropathic pain of right foot, History of foot surgery, Generalized anxiety disorder, Neuropathic pain of both legs, and Chronic pain syndrome were also pertinent to this visit.  Visit Diagnosis (New problems to examiner): 1. Complex regional pain syndrome type II of right lower limb   2. Neuropathic pain of right foot   3. History of foot surgery   4. Generalized anxiety disorder   5. Neuropathic pain of both legs   6. Chronic pain syndrome    48 year old female with a history of right osteochondral defect resulting in chronic right ankle and foot pain status post Left first MTP joint fusion, posterior tibial repair and ankle arthroscopic debridement on 09/26/2018.  Patient has been working with outpatient physical therapy since her surgery.  She endorses severe pain of her right ankle and right foot, swelling, discoloration, differences in hair growth, differences in nail growth, increased sensitivity.  Patient does meet [place] criteria for complex regional pain syndrome, type II.  We had extensive discussion about CRPS as well as therapeutic options.  I recommended the patient increase her gabapentin so that she is taking 300 mg in the morning, 300 mg in the afternoon and 600 mg nightly.  We  also discussed adding Cymbalta to help out with her neuropathic pain.  Recommend 30 mg daily for 1 month and then increase to 60 mg daily if no side effects.  We discussed lumbar sympathetic nerve block at right L3 for her right lower extremity CRPS.  Risks and benefits of this procedure were discussed in great detail and patient would like to proceed.  Future considerations could include lumbar spinal cord stimulation for CRPS.  I would also like to refer the patient to neurology for  consideration of EMG/NCV to further evaluate for peripheral neuropathy.  Plan of Care (Initial workup plan)   Ordered Lab-work, Procedure(s), Referral(s), & Consult(s): Orders Placed This Encounter  Procedures  . LUMBAR SYMPATHETIC BLOCK  . Ambulatory referral to Neurology   Pharmacotherapy (current): Medications ordered:  Meds ordered this encounter  Medications  . gabapentin (NEURONTIN) 300 MG capsule    Sig: 300 mg in the morning, 300 mg in the afternoon, 600 mg nightly    Dispense:  120 capsule    Refill:  2  . DULoxetine (CYMBALTA) 30 MG capsule    Sig: 30 mg daily for 1 month and if no side effects increase to 60 mg daily    Dispense:  60 capsule    Refill:  2   Medications administered during this visit: Jirah C. Welch had no medications administered during this visit.   Pharmacological management options:  Opioid Analgesics: avoid if possible  Membrane stabilizer: Increase gabapentin at this visit, start Cymbalta.  Future considerations could include Lyrica, TCA  Muscle relaxant: Not indicated  NSAID: Not indicated  Other analgesic(s): Consider compounded ketamine cream   Interventional management options: Ms. Styles was informed that there is no guarantee that she would be a candidate for interventional therapies. The decision will be based on the results of diagnostic studies, as well as Valerie Welch's risk profile.  Procedure(s) under consideration:  Right L3 lumbar sympathetic nerve block Lumbar spinal cord stimulation Lidocaine infusion Ketamine   Provider-requested follow-up: Return for Procedure.  Future Appointments  Date Time Provider [place]  02/16/2019 12:30 PM Gillis Santa, MD [facility] None    Primary Care Physician: Tracie Harrier, MD Location: [facility] Out[facility] Management Facility Note by: Gillis Santa, M.D, Date: 02/12/2019; Time: 2:57 PM  Patient Instructions  It was nice meeting you today.  Today we  discussed the following:  -It is likely that you have complex regional pain syndrome type II of your right ankle.  This is characterized by severe pain, swelling, changes in hair growth, redness; all the symptoms that you experience. -Increase gabapentin so that you are taking 300 mg in the morning, 300 mg in the afternoon, 600 mg in the evening. -We will also add Cymbalta which can be effective for nerve pain.  Start at 30 mg daily for 1 month and then increase to 60 mg on month 2. -We discussed various interventional options including lumbar sympathetic nerve block as well as lumbar spinal cord stimulation.  We will start with right lumbar sympathetic nerve block as discussed.____________________________________________________________________________________________  Preparing for Procedure with Sedation  Instructions: . Oral Intake: Do not eat or drink anything for at least 8 hours prior to your procedure. . Transportation: Public transportation is not allowed. Bring an adult driver. The driver must be physically present in our waiting room before any procedure can be started. Marland Kitchen Physical Assistance: Bring an adult physically capable of assisting you, in the event you need help. This adult should keep you company at  home for at least 6 hours after the procedure. . Blood Pressure Medicine: Take your blood pressure medicine with a sip of water the morning of the procedure. . Blood thinners: Notify our staff if you are taking any blood thinners. Depending on which one you take, there will be specific instructions on how and when to stop it. . Diabetics on insulin: Notify the staff so that you can be scheduled 1st case in the morning. If your diabetes requires high dose insulin, take only  of your normal insulin dose the morning of the procedure and notify the staff that you have done so. . Preventing infections: Shower with an antibacterial soap the morning of your procedure. . Build-up your immune  system: Take 1000 mg of Vitamin C with every meal (3 times a day) the day prior to your procedure. Marland Kitchen Antibiotics: Inform the staff if you have a condition or reason that requires you to take antibiotics before dental procedures. . Pregnancy: If you are pregnant, call and cancel the procedure. . Sickness: If you have a cold, fever, or any active infections, call and cancel the procedure. . Arrival: You must be in the facility at least 30 minutes prior to your scheduled procedure. . Children: Do not bring children with you. . Dress appropriately: Bring dark clothing that you would not mind if they get stained. . Valuables: Do not bring any jewelry or valuables.  Procedure appointments are reserved for interventional treatments only. Marland Kitchen No Prescription Refills. . No medication changes will be discussed during procedure appointments. . No disability issues will be discussed.  Reasons to call and reschedule or cancel your procedure: (Following these recommendations will minimize the risk of a serious complication.) . Surgeries: Avoid having procedures within 2 weeks of any surgery. (Avoid for 2 weeks before or after any surgery). . Flu Shots: Avoid having procedures within 2 weeks of a flu shots or . (Avoid for 2 weeks before or after immunizations). . Barium: Avoid having a procedure within 7-10 days after having had a radiological study involving the use of radiological contrast. (Myelograms, Barium swallow or enema study). . Heart attacks: Avoid any elective procedures or surgeries for the initial 6 months after a "Myocardial Infarction" (Heart Attack). . Blood thinners: It is imperative that you stop these medications before procedures. Let us know if you if you take any blood thinner.  . Infection: Avoid procedures during or within two weeks of an infection (including chest colds or gastrointestinal problems). Symptoms associated with infections include: Localized redness, fever, chills, night  sweats or profuse sweating, burning sensation when voiding, cough, congestion, stuffiness, runny nose, sore throat, diarrhea, nausea, vomiting, cold or Flu symptoms, recent or current infections. It is specially important if the infection is over the area that we intend to treat. Marland Kitchen Heart and lung problems: Symptoms that may suggest an active cardiopulmonary problem include: cough, chest pain, breathing difficulties or shortness of breath, dizziness, ankle swelling, uncontrolled high or unusually low blood pressure, and/or palpitations. If you are experiencing any of these symptoms, cancel your procedure and contact your primary care physician for an evaluation.  Remember:  Regular Business hours are:  Monday to Thursday 8:00 AM to 4:00 PM  Provider's Schedule: Milinda Pointer, MD:  Procedure days: Tuesday and Thursday 7:30 AM to 4:00 PM  Gillis Santa, MD:  Procedure days: Monday and Wednesday 7:30 AM to 4:00 PM ____________________________________________________________________________________________ Prescriptions for Cymbalta and Neurontin have been sent to your pharmacy.

## 2019-02-12 NOTE — Patient Instructions (Addendum)
It was nice meeting you today.  Today we discussed the following:  -It is likely that you have complex regional pain syndrome type II of your right ankle.  This is characterized by severe pain, swelling, changes in hair growth, redness; all the symptoms that you experience. -Increase gabapentin so that you are taking 300 mg in the morning, 300 mg in the afternoon, 600 mg in the evening. -We will also add Cymbalta which can be effective for nerve pain.  Start at 30 mg daily for 1 month and then increase to 60 mg on month 2. -We discussed various interventional options including lumbar sympathetic nerve block as well as lumbar spinal cord stimulation.  We will start with right lumbar sympathetic nerve block as discussed.____________________________________________________________________________________________  Preparing for Procedure with Sedation  Instructions: . Oral Intake: Do not eat or drink anything for at least 8 hours prior to your procedure. . Transportation: Public transportation is not allowed. Bring an adult driver. The driver must be physically present in our waiting room before any procedure can be started. Marland Kitchen Physical Assistance: Bring an adult physically capable of assisting you, in the event you need help. This adult should keep you company at home for at least 6 hours after the procedure. . Blood Pressure Medicine: Take your blood pressure medicine with a sip of water the morning of the procedure. . Blood thinners: Notify our staff if you are taking any blood thinners. Depending on which one you take, there will be specific instructions on how and when to stop it. . Diabetics on insulin: Notify the staff so that you can be scheduled 1st case in the morning. If your diabetes requires high dose insulin, take only  of your normal insulin dose the morning of the procedure and notify the staff that you have done so. . Preventing infections: Shower with an antibacterial soap the morning  of your procedure. . Build-up your immune system: Take 1000 mg of Vitamin C with every meal (3 times a day) the day prior to your procedure. Marland Kitchen Antibiotics: Inform the staff if you have a condition or reason that requires you to take antibiotics before dental procedures. . Pregnancy: If you are pregnant, call and cancel the procedure. . Sickness: If you have a cold, fever, or any active infections, call and cancel the procedure. . Arrival: You must be in the facility at least 30 minutes prior to your scheduled procedure. . Children: Do not bring children with you. . Dress appropriately: Bring dark clothing that you would not mind if they get stained. . Valuables: Do not bring any jewelry or valuables.  Procedure appointments are reserved for interventional treatments only. Marland Kitchen No Prescription Refills. . No medication changes will be discussed during procedure appointments. . No disability issues will be discussed.  Reasons to call and reschedule or cancel your procedure: (Following these recommendations will minimize the risk of a serious complication.) . Surgeries: Avoid having procedures within 2 weeks of any surgery. (Avoid for 2 weeks before or after any surgery). . Flu Shots: Avoid having procedures within 2 weeks of a flu shots or . (Avoid for 2 weeks before or after immunizations). . Barium: Avoid having a procedure within 7-10 days after having had a radiological study involving the use of radiological contrast. (Myelograms, Barium swallow or enema study). . Heart attacks: Avoid any elective procedures or surgeries for the initial 6 months after a "Myocardial Infarction" (Heart Attack). . Blood thinners: It is imperative that you stop these medications  before procedures. Let us know if you if you take any blood thinner.  . Infection: Avoid procedures during or within two weeks of an infection (including chest colds or gastrointestinal problems). Symptoms associated with infections include:  Localized redness, fever, chills, night sweats or profuse sweating, burning sensation when voiding, cough, congestion, stuffiness, runny nose, sore throat, diarrhea, nausea, vomiting, cold or Flu symptoms, recent or current infections. It is specially important if the infection is over the area that we intend to treat. Marland Kitchen Heart and lung problems: Symptoms that may suggest an active cardiopulmonary problem include: cough, chest pain, breathing difficulties or shortness of breath, dizziness, ankle swelling, uncontrolled high or unusually low blood pressure, and/or palpitations. If you are experiencing any of these symptoms, cancel your procedure and contact your primary care physician for an evaluation.  Remember:  Regular Business hours are:  Monday to Thursday 8:00 AM to 4:00 PM  Provider's Schedule: Milinda Pointer, MD:  Procedure days: Tuesday and Thursday 7:30 AM to 4:00 PM  Gillis Santa, MD:  Procedure days: Monday and Wednesday 7:30 AM to 4:00 PM ____________________________________________________________________________________________ Prescriptions for Cymbalta and Neurontin have been sent to your pharmacy.

## 2019-02-13 DIAGNOSIS — G8929 Other chronic pain: Secondary | ICD-10-CM | POA: Diagnosis not present

## 2019-02-13 DIAGNOSIS — M25571 Pain in right ankle and joints of right foot: Secondary | ICD-10-CM | POA: Diagnosis not present

## 2019-02-16 ENCOUNTER — Telehealth: Payer: Self-pay

## 2019-02-16 ENCOUNTER — Ambulatory Visit: Payer: BLUE CROSS/BLUE SHIELD | Admitting: Student in an Organized Health Care Education/Training Program

## 2019-02-16 NOTE — Telephone Encounter (Signed)
Dr. Holley Raring,                I need to discuss this patients side effects with you and call her back with your recommendations. Please see me at your convenience. Thank you- Anderson Malta

## 2019-02-16 NOTE — Telephone Encounter (Signed)
She has questions regarding her medication Dr. Holley Raring put her on. She is concerned about side affects. Please call her.

## 2019-02-16 NOTE — Telephone Encounter (Signed)
Called patient back and LVM after no answer for her to return call. If she returns call please advise her of the following:   Spoke with Dr. Holley Raring about the patient's side effects with Cymbalta and Gabapentin titration. Per Dr. Holley Raring : Discontinue Cymbalta and continue with Gabapentin titration as ordered (unless she begins to have continued side effects in which she will need to call about)  After she finishes the Gabapentin titration she may then introduce the Cymbalta (if needed).

## 2019-02-17 DIAGNOSIS — M25571 Pain in right ankle and joints of right foot: Secondary | ICD-10-CM | POA: Diagnosis not present

## 2019-02-17 DIAGNOSIS — G8929 Other chronic pain: Secondary | ICD-10-CM | POA: Diagnosis not present

## 2019-02-20 DIAGNOSIS — G8929 Other chronic pain: Secondary | ICD-10-CM | POA: Diagnosis not present

## 2019-02-20 DIAGNOSIS — M25571 Pain in right ankle and joints of right foot: Secondary | ICD-10-CM | POA: Diagnosis not present

## 2019-02-24 DIAGNOSIS — G8929 Other chronic pain: Secondary | ICD-10-CM | POA: Diagnosis not present

## 2019-02-24 DIAGNOSIS — M25571 Pain in right ankle and joints of right foot: Secondary | ICD-10-CM | POA: Diagnosis not present

## 2019-02-26 DIAGNOSIS — M25571 Pain in right ankle and joints of right foot: Secondary | ICD-10-CM | POA: Diagnosis not present

## 2019-02-26 DIAGNOSIS — G8929 Other chronic pain: Secondary | ICD-10-CM | POA: Diagnosis not present

## 2019-03-02 DIAGNOSIS — G8929 Other chronic pain: Secondary | ICD-10-CM | POA: Diagnosis not present

## 2019-03-02 DIAGNOSIS — M25571 Pain in right ankle and joints of right foot: Secondary | ICD-10-CM | POA: Diagnosis not present

## 2019-03-06 DIAGNOSIS — M25571 Pain in right ankle and joints of right foot: Secondary | ICD-10-CM | POA: Diagnosis not present

## 2019-03-06 DIAGNOSIS — G8929 Other chronic pain: Secondary | ICD-10-CM | POA: Diagnosis not present

## 2019-03-06 DIAGNOSIS — M79671 Pain in right foot: Secondary | ICD-10-CM | POA: Diagnosis not present

## 2019-03-07 ENCOUNTER — Other Ambulatory Visit: Payer: Self-pay | Admitting: Student in an Organized Health Care Education/Training Program

## 2019-03-10 DIAGNOSIS — M25571 Pain in right ankle and joints of right foot: Secondary | ICD-10-CM | POA: Diagnosis not present

## 2019-03-10 DIAGNOSIS — G8929 Other chronic pain: Secondary | ICD-10-CM | POA: Diagnosis not present

## 2019-03-17 DIAGNOSIS — M958 Other specified acquired deformities of musculoskeletal system: Secondary | ICD-10-CM | POA: Diagnosis not present

## 2019-03-17 DIAGNOSIS — M2021 Hallux rigidus, right foot: Secondary | ICD-10-CM | POA: Diagnosis not present

## 2019-03-17 DIAGNOSIS — G90521 Complex regional pain syndrome I of right lower limb: Secondary | ICD-10-CM | POA: Diagnosis not present

## 2019-03-19 DIAGNOSIS — G8929 Other chronic pain: Secondary | ICD-10-CM | POA: Diagnosis not present

## 2019-03-19 DIAGNOSIS — M25571 Pain in right ankle and joints of right foot: Secondary | ICD-10-CM | POA: Diagnosis not present

## 2019-03-23 DIAGNOSIS — G8929 Other chronic pain: Secondary | ICD-10-CM | POA: Diagnosis not present

## 2019-03-23 DIAGNOSIS — M25571 Pain in right ankle and joints of right foot: Secondary | ICD-10-CM | POA: Diagnosis not present

## 2019-03-27 DIAGNOSIS — M25571 Pain in right ankle and joints of right foot: Secondary | ICD-10-CM | POA: Diagnosis not present

## 2019-03-27 DIAGNOSIS — G8929 Other chronic pain: Secondary | ICD-10-CM | POA: Diagnosis not present

## 2019-03-30 DIAGNOSIS — G8929 Other chronic pain: Secondary | ICD-10-CM | POA: Diagnosis not present

## 2019-03-30 DIAGNOSIS — M25571 Pain in right ankle and joints of right foot: Secondary | ICD-10-CM | POA: Diagnosis not present

## 2019-04-02 DIAGNOSIS — M25571 Pain in right ankle and joints of right foot: Secondary | ICD-10-CM | POA: Diagnosis not present

## 2019-04-02 DIAGNOSIS — G8929 Other chronic pain: Secondary | ICD-10-CM | POA: Diagnosis not present

## 2019-04-06 DIAGNOSIS — G8929 Other chronic pain: Secondary | ICD-10-CM | POA: Diagnosis not present

## 2019-04-06 DIAGNOSIS — M25571 Pain in right ankle and joints of right foot: Secondary | ICD-10-CM | POA: Diagnosis not present

## 2019-04-08 DIAGNOSIS — M25571 Pain in right ankle and joints of right foot: Secondary | ICD-10-CM | POA: Diagnosis not present

## 2019-04-08 DIAGNOSIS — G8929 Other chronic pain: Secondary | ICD-10-CM | POA: Diagnosis not present

## 2019-04-14 DIAGNOSIS — M25571 Pain in right ankle and joints of right foot: Secondary | ICD-10-CM | POA: Diagnosis not present

## 2019-04-14 DIAGNOSIS — G8929 Other chronic pain: Secondary | ICD-10-CM | POA: Diagnosis not present

## 2019-04-16 DIAGNOSIS — G8929 Other chronic pain: Secondary | ICD-10-CM | POA: Diagnosis not present

## 2019-04-16 DIAGNOSIS — M25571 Pain in right ankle and joints of right foot: Secondary | ICD-10-CM | POA: Diagnosis not present

## 2019-04-20 DIAGNOSIS — G8929 Other chronic pain: Secondary | ICD-10-CM | POA: Diagnosis not present

## 2019-04-20 DIAGNOSIS — M25571 Pain in right ankle and joints of right foot: Secondary | ICD-10-CM | POA: Diagnosis not present

## 2019-04-22 DIAGNOSIS — G8929 Other chronic pain: Secondary | ICD-10-CM | POA: Diagnosis not present

## 2019-04-22 DIAGNOSIS — M25571 Pain in right ankle and joints of right foot: Secondary | ICD-10-CM | POA: Diagnosis not present

## 2019-04-27 ENCOUNTER — Telehealth: Payer: Self-pay

## 2019-04-27 MED ORDER — DULOXETINE HCL 30 MG PO CPEP: 60.0000 mg | ORAL_CAPSULE | Freq: Every day | ORAL | 0 refills | Status: DC

## 2019-04-27 NOTE — Telephone Encounter (Signed)
Will you do this? Cymbalta was written on 02-12-19 at her first visit. She has not been here since then.

## 2019-04-27 NOTE — Telephone Encounter (Signed)
Patient notified

## 2019-04-27 NOTE — Telephone Encounter (Signed)
Valerie Welch- please clarify if patient is taking 30 mg or 60 mg daily and I can send it in.

## 2019-04-27 NOTE — Telephone Encounter (Signed)
She left a vm stating that [facility] declined the script for cymbalta for 30 days and wants a new one sent for 90 days.

## 2019-04-28 DIAGNOSIS — M25571 Pain in right ankle and joints of right foot: Secondary | ICD-10-CM | POA: Diagnosis not present

## 2019-04-28 DIAGNOSIS — G8929 Other chronic pain: Secondary | ICD-10-CM | POA: Diagnosis not present

## 2019-04-30 DIAGNOSIS — G8929 Other chronic pain: Secondary | ICD-10-CM | POA: Diagnosis not present

## 2019-04-30 DIAGNOSIS — M25571 Pain in right ankle and joints of right foot: Secondary | ICD-10-CM | POA: Diagnosis not present

## 2019-05-04 DIAGNOSIS — M25571 Pain in right ankle and joints of right foot: Secondary | ICD-10-CM | POA: Diagnosis not present

## 2019-05-04 DIAGNOSIS — G8929 Other chronic pain: Secondary | ICD-10-CM | POA: Diagnosis not present

## 2019-05-05 DIAGNOSIS — M7582 Other shoulder lesions, left shoulder: Secondary | ICD-10-CM | POA: Diagnosis not present

## 2019-05-05 DIAGNOSIS — M7552 Bursitis of left shoulder: Secondary | ICD-10-CM | POA: Diagnosis not present

## 2019-05-07 DIAGNOSIS — M25571 Pain in right ankle and joints of right foot: Secondary | ICD-10-CM | POA: Diagnosis not present

## 2019-05-07 DIAGNOSIS — G8929 Other chronic pain: Secondary | ICD-10-CM | POA: Diagnosis not present

## 2019-05-12 DIAGNOSIS — M25571 Pain in right ankle and joints of right foot: Secondary | ICD-10-CM | POA: Diagnosis not present

## 2019-05-12 DIAGNOSIS — G8929 Other chronic pain: Secondary | ICD-10-CM | POA: Diagnosis not present

## 2019-05-13 DIAGNOSIS — F411 Generalized anxiety disorder: Secondary | ICD-10-CM | POA: Diagnosis not present

## 2019-05-13 DIAGNOSIS — E039 Hypothyroidism, unspecified: Secondary | ICD-10-CM | POA: Diagnosis not present

## 2019-05-13 DIAGNOSIS — M79671 Pain in right foot: Secondary | ICD-10-CM | POA: Diagnosis not present

## 2019-05-13 DIAGNOSIS — Z9889 Other specified postprocedural states: Secondary | ICD-10-CM | POA: Diagnosis not present

## 2019-05-13 DIAGNOSIS — F333 Major depressive disorder, recurrent, severe with psychotic symptoms: Secondary | ICD-10-CM | POA: Diagnosis not present

## 2019-05-13 DIAGNOSIS — J4520 Mild intermittent asthma, uncomplicated: Secondary | ICD-10-CM | POA: Diagnosis not present

## 2019-05-14 DIAGNOSIS — M25571 Pain in right ankle and joints of right foot: Secondary | ICD-10-CM | POA: Diagnosis not present

## 2019-05-14 DIAGNOSIS — G8929 Other chronic pain: Secondary | ICD-10-CM | POA: Diagnosis not present

## 2019-05-19 DIAGNOSIS — G8929 Other chronic pain: Secondary | ICD-10-CM | POA: Diagnosis not present

## 2019-05-19 DIAGNOSIS — M25571 Pain in right ankle and joints of right foot: Secondary | ICD-10-CM | POA: Diagnosis not present

## 2019-05-20 DIAGNOSIS — F333 Major depressive disorder, recurrent, severe with psychotic symptoms: Secondary | ICD-10-CM | POA: Diagnosis not present

## 2019-05-20 DIAGNOSIS — Z0000 Encounter for general adult medical examination without abnormal findings: Secondary | ICD-10-CM | POA: Diagnosis not present

## 2019-05-20 DIAGNOSIS — E039 Hypothyroidism, unspecified: Secondary | ICD-10-CM | POA: Diagnosis not present

## 2019-05-20 DIAGNOSIS — J4520 Mild intermittent asthma, uncomplicated: Secondary | ICD-10-CM | POA: Diagnosis not present

## 2019-05-21 DIAGNOSIS — M25571 Pain in right ankle and joints of right foot: Secondary | ICD-10-CM | POA: Diagnosis not present

## 2019-05-21 DIAGNOSIS — G8929 Other chronic pain: Secondary | ICD-10-CM | POA: Diagnosis not present

## 2019-05-25 DIAGNOSIS — M25571 Pain in right ankle and joints of right foot: Secondary | ICD-10-CM | POA: Diagnosis not present

## 2019-05-25 DIAGNOSIS — G8929 Other chronic pain: Secondary | ICD-10-CM | POA: Diagnosis not present

## 2019-05-28 DIAGNOSIS — G8929 Other chronic pain: Secondary | ICD-10-CM | POA: Diagnosis not present

## 2019-05-28 DIAGNOSIS — M25571 Pain in right ankle and joints of right foot: Secondary | ICD-10-CM | POA: Diagnosis not present

## 2019-06-01 DIAGNOSIS — G8929 Other chronic pain: Secondary | ICD-10-CM | POA: Diagnosis not present

## 2019-06-01 DIAGNOSIS — M25571 Pain in right ankle and joints of right foot: Secondary | ICD-10-CM | POA: Diagnosis not present

## 2019-06-03 DIAGNOSIS — G8929 Other chronic pain: Secondary | ICD-10-CM | POA: Diagnosis not present

## 2019-06-03 DIAGNOSIS — M25571 Pain in right ankle and joints of right foot: Secondary | ICD-10-CM | POA: Diagnosis not present

## 2019-06-16 DIAGNOSIS — Z20828 Contact with and (suspected) exposure to other viral communicable diseases: Secondary | ICD-10-CM | POA: Diagnosis not present

## 2019-07-20 ENCOUNTER — Other Ambulatory Visit: Payer: Self-pay | Admitting: Student in an Organized Health Care Education/Training Program

## 2019-08-06 ENCOUNTER — Telehealth: Payer: Self-pay | Admitting: *Deleted

## 2019-08-06 MED ORDER — DULOXETINE HCL 30 MG PO CPEP: 60.0000 mg | ORAL_CAPSULE | Freq: Every day | ORAL | 0 refills | Status: DC

## 2019-08-06 NOTE — Telephone Encounter (Signed)
Will you send this prescription? Last appt 02-12-19, no scheduled appointments.

## 2019-08-06 NOTE — Telephone Encounter (Signed)
Patient notified script for Cymbalta sent to pharmacy.

## 2019-09-17 DIAGNOSIS — Z131 Encounter for screening for diabetes mellitus: Secondary | ICD-10-CM | POA: Diagnosis not present

## 2019-09-17 DIAGNOSIS — E039 Hypothyroidism, unspecified: Secondary | ICD-10-CM | POA: Diagnosis not present

## 2019-09-17 DIAGNOSIS — J4520 Mild intermittent asthma, uncomplicated: Secondary | ICD-10-CM | POA: Diagnosis not present

## 2019-09-17 DIAGNOSIS — F333 Major depressive disorder, recurrent, severe with psychotic symptoms: Secondary | ICD-10-CM | POA: Diagnosis not present

## 2019-09-17 DIAGNOSIS — Z1389 Encounter for screening for other disorder: Secondary | ICD-10-CM | POA: Diagnosis not present

## 2019-09-17 DIAGNOSIS — Z0000 Encounter for general adult medical examination without abnormal findings: Secondary | ICD-10-CM | POA: Diagnosis not present

## 2019-09-24 DIAGNOSIS — M7582 Other shoulder lesions, left shoulder: Secondary | ICD-10-CM | POA: Diagnosis not present

## 2019-09-24 DIAGNOSIS — J45909 Unspecified asthma, uncomplicated: Secondary | ICD-10-CM | POA: Diagnosis not present

## 2019-09-24 DIAGNOSIS — K219 Gastro-esophageal reflux disease without esophagitis: Secondary | ICD-10-CM | POA: Diagnosis not present

## 2019-09-24 DIAGNOSIS — F411 Generalized anxiety disorder: Secondary | ICD-10-CM | POA: Diagnosis not present

## 2019-10-20 ENCOUNTER — Other Ambulatory Visit: Payer: Self-pay | Admitting: Student in an Organized Health Care Education/Training Program

## 2019-11-16 ENCOUNTER — Telehealth: Payer: Self-pay | Admitting: Student in an Organized Health Care Education/Training Program

## 2019-11-16 ENCOUNTER — Encounter: Payer: Self-pay | Admitting: Student in an Organized Health Care Education/Training Program

## 2019-11-16 NOTE — Telephone Encounter (Signed)
Completed by Genelle Bal

## 2019-11-16 NOTE — Telephone Encounter (Signed)
Added this patient to Dr. Holley Raring for tomorrow at [place]. She will need nurse call.

## 2019-11-17 ENCOUNTER — Other Ambulatory Visit: Payer: Self-pay

## 2019-11-17 ENCOUNTER — Encounter: Payer: Self-pay | Admitting: Student in an Organized Health Care Education/Training Program

## 2019-11-17 ENCOUNTER — Ambulatory Visit
Payer: BLUE CROSS/BLUE SHIELD | Attending: Student in an Organized Health Care Education/Training Program | Admitting: Student in an Organized Health Care Education/Training Program

## 2019-11-17 DIAGNOSIS — G894 Chronic pain syndrome: Secondary | ICD-10-CM

## 2019-11-17 DIAGNOSIS — Z9889 Other specified postprocedural states: Secondary | ICD-10-CM

## 2019-11-17 DIAGNOSIS — F411 Generalized anxiety disorder: Secondary | ICD-10-CM | POA: Diagnosis not present

## 2019-11-17 DIAGNOSIS — G5771 Causalgia of right lower limb: Secondary | ICD-10-CM | POA: Diagnosis not present

## 2019-11-17 DIAGNOSIS — M792 Neuralgia and neuritis, unspecified: Secondary | ICD-10-CM | POA: Diagnosis not present

## 2019-11-17 DIAGNOSIS — G5793 Unspecified mononeuropathy of bilateral lower limbs: Secondary | ICD-10-CM

## 2019-11-17 MED ORDER — DULOXETINE HCL 60 MG PO CPEP: 60.0000 mg | ORAL_CAPSULE | Freq: Every day | ORAL | 11 refills | Status: DC

## 2019-11-17 NOTE — Progress Notes (Signed)
Pain Management Virtual Encounter Note - Virtual Visit via [place] (real-time audio visits between healthcare provider and patient).   Patient's Phone No. & Preferred Pharmacy:  [phone] (home); [phone] (mobile); (Preferred) [phone] [email or web address]  [facility], [place] [address] [place] [address] Phone: [phone] Fax: [phone]    Pre-screening note:  Our staff contacted Valerie Welch and offered her an "in person", "face-to-face" appointment versus a telephone encounter. She indicated preferring the telephone encounter, at this time.   Reason for Virtual Visit: COVID-19*  Social distancing based on CDC and AMA recommendations.   I contacted Valerie Welch on 11/17/2019 via video conference.      I clearly identified myself as Gillis Santa, MD. I verified that I was speaking with the correct person using two identifiers (Name: Valerie Welch, and date of birth: 1971/09/09).  Advanced Informed Consent I sought verbal advanced consent from Valerie Welch for virtual visit interactions. I informed Valerie Welch of possible security and privacy concerns, risks, and limitations associated with providing "not-in-person" medical evaluation and management services. I also informed Valerie Welch of the availability of "in-person" appointments. Finally, I informed her that there would be a charge for the virtual visit and that she could be  personally, fully or partially, financially responsible for it. Valerie Welch expressed understanding and agreed to proceed.   Historic Elements   Valerie Welch is a 48 y.o. year old, female patient evaluated today after her last encounter by our practice on 11/16/2019. Valerie Welch  has a past medical history of Anemia, Asthma, Barrett's esophagus, Bursitis, GERD (gastroesophageal reflux disease), History of hiatal hernia,  and Hypothyroidism. She also  has a past surgical history that includes Cesarean section; Cholecystectomy, laparoscopic; Ankle arthroscopy (Right, 09/26/2018); Tenotomy / flexor tendon transfer (Right, 09/26/2018); Arthrodesis metatarsalphalangeal joint (mtpj) (Right, 09/26/2018); and Exostectectomy toe (Right, 09/26/2018). Valerie Welch has a current medication list which includes the following prescription(s): acetaminophen, alprazolam, azelastine, bepreve, bupropion, cetirizine, b-12, diclofenac sodium, gabapentin, ibuprofen, iron polysaccharides, levothyroxine, montelukast, multivitamin with minerals, norethindrone, omeprazole, rosuvastatin, trolamine salicylate, duloxetine, melatonin, and pazeo. She  reports that she has never smoked. She has never used smokeless tobacco. She reports current alcohol use of about 3.0 standard drinks of alcohol per week. She reports that she does not use drugs. Valerie Welch is allergic to beef-derived products; other; percocet [oxycodone-acetaminophen]; and pitocin [oxytocin].   HPI  Today, she is being contacted for medication management.  Patient states that overall she is doing better than when she saw me in February 2020.  She did have her EMG/NCV study performed which was normal.  She has find benefit with Cymbalta at 60 mg daily and gabapentin at 300 mg every morning, 300 mg every  p.m., 600 mg nightly.   patient does not have a pain contract at this pain clinic.  We are not managing her opioid medications.  Given that she has a longstanding relationship with her primary care provider, I recommend that she follow-up with Dr. Ginette Pitman for refills on Cymbalta and gabapentin in the future.  If patient would like for [place] to manage her chronic opioid therapy, she will need to perform urine toxicology screen and see pain psychiatry which is customary for new patients.  I informed the patient that if her pain worsens, she is welcome to contact our clinic to discuss  interventional options such as lumbar sympathetic nerve block pain or spinal cord stimulation.  Patient  endorsed understanding.   Assessment  The primary encounter diagnosis was Complex regional pain syndrome type II of right lower limb. Diagnoses of Neuropathic pain of right foot, History of foot surgery, Generalized anxiety disorder, Neuropathic pain of both legs, and Chronic pain syndrome were also pertinent to this visit.  Plan of Care  I have discontinued Valerie Welch's DULoxetine. I am also having her start on DULoxetine. Additionally, I am having her maintain her omeprazole, buPROPion, ALPRAZolam, B-12, azelastine, multivitamin with minerals, diclofenac sodium, iron polysaccharides, ibuprofen, levothyroxine, rosuvastatin, P[place]o, Melatonin, trolamine salicylate, acetaminophen, norethindrone, gabapentin, Bepreve, montelukast, and cetirizine.  Pharmacotherapy (Medications Ordered): Meds ordered this encounter  Medications  . DULoxetine (CYMBALTA) 60 MG capsule    Sig: Take 1 capsule (60 mg total) by mouth daily.    Dispense:  30 capsule    Refill:  11   Follow-up plan:   Return if symptoms worsen or fail to improve.    Recent Visits No visits were found meeting these conditions.  Showing recent visits within past 90 days and meeting all other requirements   Today's Visits Date Type Provider Dept  11/17/19 Office Visit Gillis Santa, MD [facility]  Showing today's visits and meeting all other requirements   Future Appointments No visits were found meeting these conditions.  Showing future appointments within next 90 days and meeting all other requirements   I discussed the assessment and treatment plan with the patient. The patient was provided an opportunity to ask questions and all were answered. The patient agreed with the plan and demonstrated an understanding of the instructions.  Patient advised to call back or seek an in-person evaluation if the  symptoms or condition worsens.  Total duration of non-face-to-face encounter: 15 minutes.  Note by: Gillis Santa, MD Date: 11/17/2019; Time: 11:13 AM  Note: This dictation was prepared with Dragon dictation. Any transcriptional errors that may result from this process are unintentional.  Disclaimer:  * Given the special circumstances of the COVID-19 pandemic, the federal government has announced that the Office for Civil Rights (OCR) will exercise its enforcement discretion and will not impose penalties on physicians using telehealth in the event of noncompliance with regulatory requirements under the [place] (HIPAA) in connection with the good faith provision of telehealth during the XX123456 national public health emergency. ([place])

## 2019-12-10 ENCOUNTER — Other Ambulatory Visit: Payer: Self-pay | Admitting: Student in an Organized Health Care Education/Training Program

## 2019-12-29 ENCOUNTER — Other Ambulatory Visit: Payer: Self-pay | Admitting: Obstetrics & Gynecology

## 2020-01-12 ENCOUNTER — Other Ambulatory Visit: Payer: Self-pay | Admitting: Obstetrics & Gynecology

## 2020-01-12 DIAGNOSIS — N6310 Unspecified lump in the right breast, unspecified quadrant: Secondary | ICD-10-CM

## 2020-02-16 ENCOUNTER — Other Ambulatory Visit: Payer: Self-pay

## 2020-02-16 ENCOUNTER — Ambulatory Visit
Admission: RE | Admit: 2020-02-16 | Discharge: 2020-02-16 | Disposition: A | Discharge status: Home / Self Care | Payer: BC Managed Care – PPO | Source: Ambulatory Visit | Attending: Obstetrics & Gynecology | Admitting: Obstetrics & Gynecology

## 2020-02-16 DIAGNOSIS — N6310 Unspecified lump in the right breast, unspecified quadrant: Secondary | ICD-10-CM

## 2020-03-15 ENCOUNTER — Other Ambulatory Visit: Payer: Self-pay

## 2020-03-16 ENCOUNTER — Ambulatory Visit (INDEPENDENT_AMBULATORY_CARE_PROVIDER_SITE_OTHER): Payer: BC Managed Care – PPO | Admitting: Obstetrics & Gynecology

## 2020-03-16 ENCOUNTER — Encounter: Payer: Self-pay | Admitting: Obstetrics & Gynecology

## 2020-03-16 VITALS — BP 136/88 | Ht 61.0 in | Wt 193.0 lb

## 2020-03-16 DIAGNOSIS — Z30431 Encounter for routine checking of intrauterine contraceptive device: Secondary | ICD-10-CM

## 2020-03-16 DIAGNOSIS — Z6836 Body mass index (BMI) 36.0-36.9, adult: Secondary | ICD-10-CM | POA: Diagnosis not present

## 2020-03-16 DIAGNOSIS — Z01419 Encounter for gynecological examination (general) (routine) without abnormal findings: Secondary | ICD-10-CM | POA: Diagnosis not present

## 2020-03-16 DIAGNOSIS — G5771 Causalgia of right lower limb: Secondary | ICD-10-CM

## 2020-03-16 DIAGNOSIS — N921 Excessive and frequent menstruation with irregular cycle: Secondary | ICD-10-CM

## 2020-03-16 LAB — TSH: TSH: 2.34 mIU/L

## 2020-03-16 LAB — FOLLICLE STIMULATING HORMONE: FSH: 9.6 m[IU]/mL

## 2020-03-16 LAB — CBC
HCT: 45.3 % — ABNORMAL HIGH (ref 35.0–45.0)
Hemoglobin: 15 g/dL (ref 11.7–15.5)
MCH: 31.1 pg (ref 27.0–33.0)
MCHC: 33.1 g/dL (ref 32.0–36.0)
MCV: 93.8 fL (ref 80.0–100.0)
MPV: 10.4 fL (ref 7.5–12.5)
Platelets: 378 10*3/uL (ref 140–400)
RBC: 4.83 10*6/uL (ref 3.80–5.10)
RDW: 12.9 % (ref 11.0–15.0)
WBC: 5.3 10*3/uL (ref 3.8–10.8)

## 2020-03-16 LAB — PROLACTIN: Prolactin: 22.5 ng/mL

## 2020-03-16 MED ORDER — MEGESTROL ACETATE 40 MG PO TABS: 40.0000 mg | ORAL_TABLET | Freq: Two times a day (BID) | ORAL | 1 refills | Status: AC

## 2020-03-16 MED ORDER — MEGESTROL ACETATE 40 MG PO TABS: 40.0000 mg | ORAL_TABLET | Freq: Two times a day (BID) | ORAL | 1 refills | Status: DC

## 2020-03-16 NOTE — Progress Notes (Signed)
Valerie Welch 06/15/1971 [id number]   History:    49 y.o. [id number] Married  RP:  Established patient presenting for annual gyn exam   HPI: Well on Mirena IUD x 01/2017, but the Progestin-only pill is not controling BTB x last 6 months.  Menses are also heavy x 6 months. No pelvic pain.  No pain with occasional IC, but having postcoital bleeding.  Husband has Bipolar disorder.  Urine/BMs wnl.  Breasts normal.  BMI increased to 36.47.  Recovering from Rt foot surgery [id number], complicated by CRP Syndrome.  Health labs with Fam MD   Past medical history,surgical history, family history and social history were all reviewed and documented in the EPIC chart.  Gynecologic History No LMP recorded. (Menstrual status: IUD).  Obstetric History OB History  Gravida Para Term Preterm AB Living  5 2     2 2   SAB TAB Ectopic Multiple Live Births  1            # Outcome Date GA Lbr Len/2nd Weight Sex Delivery Anes PTL Lv  5 Gravida           4 AB           3 SAB           2 Para           1 Para              ROS: A ROS was performed and pertinent positives and negatives are included in the history.  GENERAL: No fevers or chills. HEENT: No change in vision, no earache, sore throat or sinus congestion. NECK: No pain or stiffness. CARDIOVASCULAR: No chest pain or pressure. No palpitations. PULMONARY: No shortness of breath, cough or wheeze. GASTROINTESTINAL: No abdominal pain, nausea, vomiting or diarrhea, melena or bright red blood per rectum. GENITOURINARY: No urinary frequency, urgency, hesitancy or dysuria. MUSCULOSKELETAL: No joint or muscle pain, no back pain, no recent trauma. DERMATOLOGIC: No rash, no itching, no lesions. ENDOCRINE: No polyuria, polydipsia, no heat or cold intolerance. No recent change in weight. HEMATOLOGICAL: No anemia or easy bruising or bleeding. NEUROLOGIC: No headache, seizures, numbness, tingling or weakness. PSYCHIATRIC: No depression, no loss of interest in  normal activity or change in sleep pattern.     Exam:   BP 136/88   Ht 5\' 1"  (1.549 m)   Wt 193 lb (87.5 kg)   BMI 36.47 kg/m   Body mass index is 36.47 kg/m.  General appearance : Well developed well nourished female. No acute distress HEENT: Eyes: no retinal hemorrhage or exudates,  Neck supple, trachea midline, no carotid bruits, no thyroidmegaly Lungs: Clear to auscultation, no rhonchi or wheezes, or rib retractions  Heart: Regular rate and rhythm, no murmurs or gallops Breast:Examined in sitting and supine position were symmetrical in appearance, no palpable masses or tenderness,  no skin retraction, no nipple inversion, no nipple discharge, no skin discoloration, no axillary or supraclavicular lymphadenopathy Abdomen: no palpable masses or tenderness, no rebound or guarding Extremities: no edema or skin discoloration or tenderness  Pelvic: Vulva: Normal             Vagina: No gross lesions or discharge  Cervix: No gross lesions or discharge.  [facility]  Pap reflex done.  Uterus  AV, normal size, shape and consistency, non-tender and mobile  Adnexa  Without masses or tenderness  Anus: Normal   Assessment/Plan:  49 y.o. female for annual exam  1. Encounter for routine gynecological examination with Papanicolaou smear of cervix Normal gynecologic exam.  Pap reflex done.  Breast exam normal.  Screening mammogram in February 2021 with right breast ultrasound were benign.  Will do colonoscopy at age 58 next year.  Health labs with family physician.  2. Encounter for routine checking of intrauterine contraceptive device (IUD) Mirena IUD since 2018.  IUD in good position.  Frequent breakthrough bleeding with postcoital bleeding and heavier menses.  Will do lab work and follow-up with a pelvic ultrasound.  3. [name] Rule out secondary anemia, thyroid dysfunction hyperprolactinemia.  Will verify [facility] for perimenopause.  Will follow up with a pelvic  ultrasound to rule out endometrial pathology such as polyps, fibroids, endometrial hyperplasia and endometrial cancer.  Will check IUD placement as well.  We will continue on Mirena IUD at this time.  Stop the progestin only pill and switch to Megace 40 mg/tab, 1 tablet twice a day until next appointment.  Will decide on long term management per findings. - CBC - TSH - Prolactin - FSH - US Transvaginal Non-OB; Future  4. Class 2 severe obesity due to excess calories with serious comorbidity and body mass index (BMI) of 36.0 to 36.9 in adult [facility]) Recommend a lower calorie/carb diet such as [employer].  Aerobic physical activities 5 times a week and light weightlifting every 2 days.  5. Complex regional pain syndrome type II of right lower limb Stable on treatment.  Other orders - polyethylene glycol (MIRALAX / GLYCOLAX) 17 g packet; Take 17 g by mouth daily. - megestrol (MEGACE) 40 MG tablet; Take 1 tablet (40 mg total) by mouth 2 (two) times daily for 21 days.  Princess Bruins MD, 9:23 AM 03/16/2020

## 2020-03-16 NOTE — Patient Instructions (Signed)
1. Encounter for routine gynecological examination with Papanicolaou smear of cervix Normal gynecologic exam.  Pap reflex done.  Breast exam normal.  Screening mammogram in February 2021 with right breast ultrasound were benign.  Will do colonoscopy at age 49 next year.  Health labs with family physician.  2. Encounter for routine checking of intrauterine contraceptive device (IUD) Mirena IUD since 2018.  IUD in good position.  Frequent breakthrough bleeding with postcoital bleeding and heavier menses.  Will do lab work and follow-up with a pelvic ultrasound.  3. [name] Rule out secondary anemia, thyroid dysfunction hyperprolactinemia.  Will verify [facility] for perimenopause.  Will follow up with a pelvic ultrasound to rule out endometrial pathology such as polyps, fibroids, endometrial hyperplasia and endometrial cancer.  Will check IUD placement as well.  We will continue on Mirena IUD at this time.  Stop the progestin only pill and switch to Megace 40 mg/tab, 1 tablet twice a day until next appointment.  Will decide on long term management per findings. - CBC - TSH - Prolactin - FSH - US Transvaginal Non-OB; Future  4. Class 2 severe obesity due to excess calories with serious comorbidity and body mass index (BMI) of 36.0 to 36.9 in adult [facility]) Recommend a lower calorie/carb diet such as [employer].  Aerobic physical activities 5 times a week and light weightlifting every 2 days.  5. Complex regional pain syndrome type II of right lower limb Stable on treatment.  Other orders - polyethylene glycol (MIRALAX / GLYCOLAX) 17 g packet; Take 17 g by mouth daily. - megestrol (MEGACE) 40 MG tablet; Take 1 tablet (40 mg total) by mouth 2 (two) times daily for 21 days.  Valerie Welch, it was a pleasure seeing you today!  I will inform you of your results as soon as they are available.

## 2020-03-17 ENCOUNTER — Other Ambulatory Visit: Payer: Self-pay | Admitting: Obstetrics & Gynecology

## 2020-03-18 LAB — PAP IG W/ RFLX HPV ASCU

## 2020-05-03 ENCOUNTER — Other Ambulatory Visit: Payer: Self-pay | Admitting: Obstetrics & Gynecology

## 2020-05-03 NOTE — Telephone Encounter (Signed)
Pelvic u/s and visit 05/05/20 as you recommended back in January at visit.

## 2020-05-04 ENCOUNTER — Other Ambulatory Visit: Payer: Self-pay

## 2020-05-05 ENCOUNTER — Ambulatory Visit: Payer: BC Managed Care – PPO | Admitting: Obstetrics & Gynecology

## 2020-05-05 ENCOUNTER — Ambulatory Visit (INDEPENDENT_AMBULATORY_CARE_PROVIDER_SITE_OTHER): Payer: BC Managed Care – PPO

## 2020-05-05 ENCOUNTER — Encounter: Payer: Self-pay | Admitting: Obstetrics & Gynecology

## 2020-05-05 DIAGNOSIS — N921 Excessive and frequent menstruation with irregular cycle: Secondary | ICD-10-CM

## 2020-05-05 DIAGNOSIS — D219 Benign neoplasm of connective and other soft tissue, unspecified: Secondary | ICD-10-CM

## 2020-05-05 NOTE — Progress Notes (Signed)
    LANIE KOCHMAN August 31, 1971 [id number]        49 y.o.  [id number] Married   RP: [name] on Mirena IUD for Pelvic US  HPI: No change x visit on 03/16/2020 when we noted:  Well on Mirena IUD x 01/2017, but theProgestin-only pill is not controling BTB x last 6 months.  Menses are also heavy x 6 months.No pelvic pain. No pain with occasional IC, but having postcoital bleeding.   OB History  Gravida Para Term Preterm AB Living  5 2     2 2   SAB TAB Ectopic Multiple Live Births  1            # Outcome Date GA Lbr Len/2nd Weight Sex Delivery Anes PTL Lv  5 Gravida           4 AB           3 SAB           2 Para           1 Para             Past medical history,surgical history, problem list, medications, allergies, family history and social history were all reviewed and documented in the EPIC chart.   Directed ROS with pertinent positives and negatives documented in the history of present illness/assessment and plan.  Exam:  There were no vitals filed for this visit. General appearance:  Normal  Pelvic US today: T/V images.  Anteverted uterus enlarged with central fibroids measured at 4.3 x 3.2 x 2.4 cm.  The overall uterine size is measured at 8.34 x 5.74 x 4.83 cm.  The endometrial cavity is deviated to the left side and distorted by the central fibroid.  The IUD is noted within the intrauterine cavity.  No obvious endometrial mass but cavity is difficult to evaluate due to distortion.  Both ovaries are normal in size with sparse follicles.  No adnexal mass.  No free fluid in the posterior cul-de-sac.   Assessment/Plan:  49 y.o. [id number]   1. [name] Persistent refractory menometrorrhagia on Mirena IUD with the progestin only pill.  On pelvic ultrasound today a large central fibroid displacing the uterine cavity is noted measured at 4.3 x 3.2 x 2.4 cm.  Patient declines further attempts to control her heavy and irregular menses with medication or hormone  therapy.  Decision to proceed with a hysterectomy.  We will schedule XI robotic total laparoscopic hysterectomy with bilateral salpingectomy.  [name] given and information.  We will follow-up with a preop visit probably by WebEx.  2. Fibroid As above.  Princess Bruins MD, 10:53 AM 05/05/2020

## 2020-05-07 ENCOUNTER — Encounter: Payer: Self-pay | Admitting: Obstetrics & Gynecology

## 2020-05-07 NOTE — Patient Instructions (Signed)
1. [name] Persistent refractory menometrorrhagia on Mirena IUD with the progestin only pill.  On pelvic ultrasound today a large central fibroid displacing the uterine cavity is noted measured at 4.3 x 3.2 x 2.4 cm.  Patient declines further attempts to control her heavy and irregular menses with medication or hormone therapy.  Decision to proceed with a hysterectomy.  We will schedule XI robotic total laparoscopic hysterectomy with bilateral salpingectomy.  [name] given and information.  We will follow-up with a preop visit probably by WebEx.  2. Fibroid As above.  [name], it was a pleasure seeing you today!

## 2020-05-09 ENCOUNTER — Telehealth: Payer: Self-pay

## 2020-05-09 NOTE — Telephone Encounter (Addendum)
Spoke with patient about scheduling surgery. We reviewed her ins benefits. I am having trouble getting in touch with ins co/online to confirm her co-insurance amount. She looked through her benefits paper and we think it is 80/20 but I will work to confirm that in the next week or so and send her a financial letter to confirm. I did calculate her estimated [facility] surgery prepymt based on that amount.  She was informed it would be due to [facility] by week before surgery.  We discussed the need for Covid test pre op and appt was scheduled accordingly and quarantine instructions were reviewed.  Patient asked me about medications she uses Cymbalta and Neurontin and if they should be stopped ahead of surgery due to anesthesia. I am not aware of her history and recommended she check with the preadmitting nurse/anesthesia to see what they recommend. Phone number provided.  Surgery was scheduled for 07/05/20 at 8:30am at [facility]. Packet will be sent.

## 2020-05-12 ENCOUNTER — Other Ambulatory Visit: Payer: Self-pay | Admitting: Student in an Organized Health Care Education/Training Program

## 2020-06-02 ENCOUNTER — Encounter: Payer: Self-pay | Admitting: Student in an Organized Health Care Education/Training Program

## 2020-06-02 ENCOUNTER — Other Ambulatory Visit: Payer: Self-pay

## 2020-06-02 ENCOUNTER — Ambulatory Visit
Payer: BC Managed Care – PPO | Attending: Student in an Organized Health Care Education/Training Program | Admitting: Student in an Organized Health Care Education/Training Program

## 2020-06-02 VITALS — BP 138/97 | HR 88 | Temp 97.3°F | Resp 18 | Ht 61.0 in | Wt 190.0 lb

## 2020-06-02 DIAGNOSIS — M792 Neuralgia and neuritis, unspecified: Secondary | ICD-10-CM

## 2020-06-02 DIAGNOSIS — G5793 Unspecified mononeuropathy of bilateral lower limbs: Secondary | ICD-10-CM

## 2020-06-02 DIAGNOSIS — G894 Chronic pain syndrome: Secondary | ICD-10-CM | POA: Diagnosis present

## 2020-06-02 DIAGNOSIS — Z9889 Other specified postprocedural states: Secondary | ICD-10-CM | POA: Diagnosis present

## 2020-06-02 DIAGNOSIS — G5771 Causalgia of right lower limb: Secondary | ICD-10-CM

## 2020-06-02 MED ORDER — DULOXETINE HCL 60 MG PO CPEP: 60.0000 mg | ORAL_CAPSULE | Freq: Every day | ORAL | 5 refills | Status: DC

## 2020-06-02 MED ORDER — GABAPENTIN 300 MG PO CAPS: ORAL_CAPSULE | ORAL | 5 refills | Status: DC

## 2020-06-02 NOTE — Progress Notes (Signed)
Safety precautions to be maintained throughout the outpatient stay will include: orient to surroundings, keep bed in low position, maintain call bell within reach at all times, provide assistance with transfer out of bed and ambulation.  

## 2020-06-02 NOTE — Patient Instructions (Signed)
Prescriptions for Cymbalta and Gabapentin were sent to your pharmacy.

## 2020-06-02 NOTE — Progress Notes (Signed)
PROVIDER NOTE: Information contained herein reflects review and annotations entered in association with encounter. Interpretation of such information and data should be left to medically-trained personnel. Information provided to patient can be located elsewhere in the medical record under "Patient Instructions". Document created using STT-dictation technology, any transcriptional errors that may result from process are unintentional.    Patient: Valerie Welch  Service Category: E/M  Provider: Gillis Santa, MD  DOB: 1971/02/25  DOS: 06/02/2020  Specialty: Interventional Pain Management  MRN: [id number]  Setting: Ambulatory outpatient  PCP: Valerie Harrier, MD  Type: Established Patient    Referring Provider: Tracie Harrier, MD  Location: Office  Delivery: Face-to-face     HPI  Reason for encounter: [name], a 49 y.o. year old female, is here today for evaluation and management of her Complex regional pain syndrome type II of right lower limb [G57.71]. [name] primary complain today is Foot Pain (right) Last encounter: Practice (05/12/2020). My last encounter with her was on 05/12/2020. Pertinent problems: [name] has History of foot surgery; Generalized anxiety disorder; Neuropathic pain of both legs; Complex regional pain syndrome type II of right lower limb; and Neuropathic pain of right foot on their pertinent problem list. Pain Assessment: Severity of Chronic pain is reported as a 1 /10. Location: Foot Right/right leg to the thigh. Onset: More than a month ago. Quality: Dull. Timing: Intermittent. Modifying factor(s): medications, elevation, ice, cream. Vitals:  height is _0  (1.549 m) and weight is 190 lb (86.2 kg). Her temporal temperature is 97.3 F (36.3 C) (abnormal). Her blood pressure is 138/97 (abnormal) and her pulse is 88. Her respiration is 18 and oxygen saturation is 100%.   [name] presents today for follow-up.  She continues to have  persistent right lower extremity foot pain secondary to CRPS.  At her last visit with me which was many months ago we discussed and ordered a lumbar sympathetic block which was unfortunately denied by her insurance.  She is getting fair pain relief with the Cymbalta and gabapentin.  She takes gabapentin 300 mg in the morning, 300 mg in the afternoon, 600 mg nightly.  Cymbalta she takes 60 mg daily.  She has an upcoming robotic assisted gynecologic surgery.  Given her history of RSD, we discussed perioperative pain management strategies.  Patient is on very low-dose opioid analgesics so I do not find any utility in having her wean.  In regards to perioperative pain management, recommend multimodal analgesia and mindful of patient positioning during surgery.  Patient will continue her gabapentin, Cymbalta perioperatively.  During the course of her surgery, recommend liposomal bupivacaine for incisional pain relief.  Could also consider intraoperative ketamine infusion for perioperative pain management which could also be opioid sparing in the postoperative.  In regards to her right lower extremity CRPS, we discussed spinal cord stimulator trial.  We discussed how this would stimulate her dorsal columns and her spinal cord and could assist with pain management from RSD.  If we are to pursue this further, patient will need x-ray of her lumbar spine to evaluate her interlaminar windows as well as psych evaluation which is customary for all new patients being considered for spinal cord stimulator trial.  We will continue our conversation with [place] in the future.  ROS  Constitutional: Denies any fever or chills Gastrointestinal: No reported hemesis, hematochezia, vomiting, or acute GI distress Musculoskeletal: Denies any acute onset joint swelling, redness, loss of ROM, or weakness Neurological: No reported episodes of acute  onset apraxia, aphasia, dysarthria, agnosia, amnesia, paralysis, loss of coordination, or  loss of consciousness  Medication Review  ALPRAZolam, B-12, Bepotastine Besilate, DULoxetine, Fluticasone-Salmeterol, HYDROcodone-Acetaminophen, Melatonin, Olopatadine HCl, acetaminophen, azelastine, buPROPion, cetirizine, diclofenac sodium, gabapentin, ibuprofen, iron polysaccharides, levothyroxine, megestrol, montelukast, multivitamin with minerals, omeprazole, polyethylene glycol, rosuvastatin, and trolamine salicylate  History Review  Allergy: [name] is allergic to beef-derived products, other, percocet [oxycodone-acetaminophen], and pitocin [oxytocin]. Drug: [name]  reports no history of drug use. Alcohol:  reports current alcohol use of about 3.0 standard drinks of alcohol per week. Tobacco:  reports that she has never smoked. She has never used smokeless tobacco. Social: [name]  reports that she has never smoked. She has never used smokeless tobacco. She reports current alcohol use of about 3.0 standard drinks of alcohol per week. She reports that she does not use drugs. Medical:  has a past medical history of Anemia, Asthma, Barrett's esophagus, Bursitis, GERD (gastroesophageal reflux disease), History of hiatal hernia, and Hypothyroidism. Surgical: [name]  has a past surgical history that includes Cesarean section; Cholecystectomy, laparoscopic; Ankle arthroscopy (Right, 09/26/2018); Tenotomy / flexor tendon transfer (Right, 09/26/2018); Arthrodesis metatarsalphalangeal joint (mtpj) (Right, 09/26/2018); and Exostectectomy toe (Right, 09/26/2018). Family: family history includes Cancer in her paternal grandfather; Diabetes in her mother; Heart disease in her mother; Hypertension in her maternal grandfather; Stroke in her brother, father, and mother.  Laboratory Chemistry Profile   Renal No results found for: BUN, CREATININE, LABCREA, BCR, GFR, GFRAA, GFRNONAA, LABVMA, EPIRU, EPINEPH24HUR, NOREPRU, NOREPI24HUR, DOPARU, [id number]   Hepatic No  results found for: AST, ALT, ALBUMIN, ALKPHOS, HCVAB, AMYLASE, LIPASE, AMMONIA   Electrolytes No results found for: NA, K, CL, CALCIUM, MG, PHOS   Bone No results found for: VD25OH, [id number], IW8032ZY2, QM2500BB0, 25OHVITD1, 25OHVITD2, 25OHVITD3, TESTOFREE, TESTOSTERONE   Inflammation (CRP: Acute Phase) (ESR: Chronic Phase) No results found for: CRP, ESRSEDRATE, LATICACIDVEN     Note: Above Lab results reviewed.  Recent Imaging Review  US Transvaginal Non-OB T/V images.  Anteverted uterus enlarged with central fibroids measured at  4.3 x 3.2 x 2.4 cm.  The overall uterine size is measured at 8.34 x 5.74 x  4.83 cm.  The endometrial cavity is deviated to the left side and  distorted by the central fibroid.  The IUD is noted within the  intrauterine cavity.  No obvious endometrial mass but cavity is difficult  to evaluate due to distortion.  Both ovaries are normal in size with  sparse follicles.  No adnexal mass.  No free fluid in the posterior  cul-de-sac.  Note: Reviewed        Physical Exam  General appearance: Well nourished, well developed, and well hydrated. In no apparent acute distress Mental status: Alert, oriented x 3 (person, place, & time)       Respiratory: No evidence of acute respiratory distress Eyes: PERLA Vitals: BP (!) 138/97    Pulse 88    Temp (!) 97.3 F (36.3 C) (Temporal)    Resp 18    Ht _0  (1.549 m)    Wt 190 lb (86.2 kg)    SpO2 100%    BMI 35.90 kg/m  BMI: Estimated body mass index is 35.9 kg/m as calculated from the following:   Height as of this encounter: _1  (1.549 m).   Weight as of this encounter: 190 lb (86.2 kg). Ideal: Ideal body weight: 47.8 kg (105 lb 6.1 oz) Adjusted ideal body weight: 63.2 kg (139 lb 3.6 oz)  Lower Extremity Exam    Side: Right lower extremity  Side: Left lower extremity  Stability: No instability observed          Stability: No instability observed          Skin & Extremity Inspection: Evidence of prior  arthroplastic surgery redness, decreased hair growth, cool to touch, swelling  Skin & Extremity Inspection: Skin color, temperature, and hair growth are WNL. No peripheral edema or cyanosis. No masses, redness, swelling, asymmetry, or associated skin lesions. No contractures.  Functional ROM: Pain restricted ROM                  Functional ROM: Unrestricted ROM                  Muscle Tone/Strength: Functionally intact. No obvious neuro-muscular anomalies detected.  Muscle Tone/Strength: Functionally intact. No obvious neuro-muscular anomalies detected.  Sensory (Neurological): Neurogenic pain pattern        Sensory (Neurological): Unimpaired        DTR: Patellar: deferred today Achilles: deferred today Plantar: deferred today  DTR: Patellar: deferred today Achilles: deferred today Plantar: deferred today  Palpation: No palpable anomalies  Palpation: No palpable anomalies    Assessment   Status Diagnosis  Persistent Persistent Persistent 1. Complex regional pain syndrome type II of right lower limb   2. Neuropathic pain of right foot   3. History of foot surgery   4. Neuropathic pain of both legs   5. Chronic pain syndrome      Updated Problems: Problem  Complex Regional Pain Syndrome Type II of Right Lower Limb  Neuropathic Pain of Right Foot  History of Foot Surgery  Neuropathic Pain of Both Legs  Generalized Anxiety Disorder    Plan of Care   Ms. LYN JOENS has a current medication list which includes the following long-term medication(s): azelastine, bupropion, cetirizine, cetirizine, duloxetine, gabapentin, iron polysaccharides, levothyroxine, montelukast, and omeprazole.  Complex regional pain syndrome type II: This is stable.  Managed on Cymbalta and gabapentin which I prescribed.  She also takes low-dose hydrocodone which is managed by her primary care provider.  At her initial visit, we requested a lumbar sympathetic nerve block which was denied by her  insurance.  Today we had a discussion about spinal cord stimulation and potential spinal cord stimulator trial and permanent implant for her CRPS of her right lower extremity.  Procedure was discussed in detail and we can consider this in the future.  Perioperative pain management strategy: Patient is having upcoming robotic assisted gynecologic surgery.  Recommend optimal patient positioning as well as incisional liposomal bupivacaine post surgery for pain management.  Recommend a fluid conservative strategy and can consider intraoperative ketamine and/or lidocaine infusion for perioperative pain management.  Patient is to continue Cymbalta and gabapentin as prescribed.  Pharmacotherapy (Medications Ordered): Meds ordered this encounter  Medications   gabapentin (NEURONTIN) 300 MG capsule    Sig: 300 mg in the morning, 300 mg in the afternoon, 600 mg nightly    Dispense:  120 capsule    Refill:  5   DULoxetine (CYMBALTA) 60 MG capsule    Sig: Take 1 capsule (60 mg total) by mouth daily.    Dispense:  30 capsule    Refill:  5    Consider neuromodulation for CRPS, [employer] spinal cord stimulator trial in the future.  We will need to evaluate the patient's thoracolumbar interlaminar windows and patient will need psych assessment prior to spinal cord  stimulator trial.  Follow-up plan:   Return in about 6 months (around 12/02/2020) for Medication Management, in person.     Right lower extremity CRPS, lumbar sympathetic nerve block denied by insurance.  Consider spinal cord stimulator trial in future.   Recent Visits No visits were found meeting these conditions. Showing recent visits within past 90 days and meeting all other requirements Today's Visits Date Type Provider Dept  06/02/20 Office Visit Valerie Santa, MD [facility]  Showing today's visits and meeting all other requirements Future Appointments No visits were found meeting these conditions. Showing future  appointments within next 90 days and meeting all other requirements  I discussed the assessment and treatment plan with the patient. The patient was provided an opportunity to ask questions and all were answered. The patient agreed with the plan and demonstrated an understanding of the instructions.  Patient advised to call back or seek an in-person evaluation if the symptoms or condition worsens.  Duration of encounter: 30 minutes.  Note by: Valerie Santa, MD Date: 06/02/2020; Time: 10:07 AM

## 2020-06-03 IMAGING — US ULTRASOUND RIGHT BREAST LIMITED
1 series · 6 of 6 positions shown · non-contrast
Comparison: Previous exam(s).

CLINICAL DATA: Follow-up probably benign mass in the 12:30 o'clock
position of the right breast.

EXAM:
ULTRASOUND OF THE RIGHT BREAST

[Series 1: ultrasound right breast limited · 0.06mm/px · 6 of 6 slices shown]
[im 1/6]
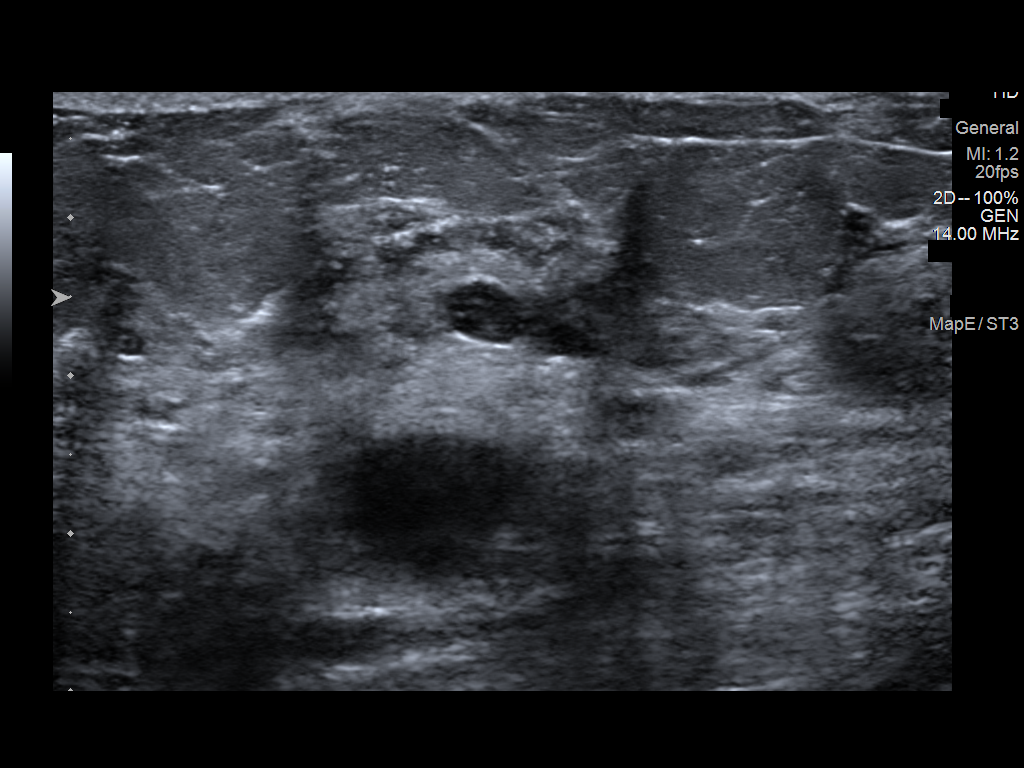
[im 2/6]
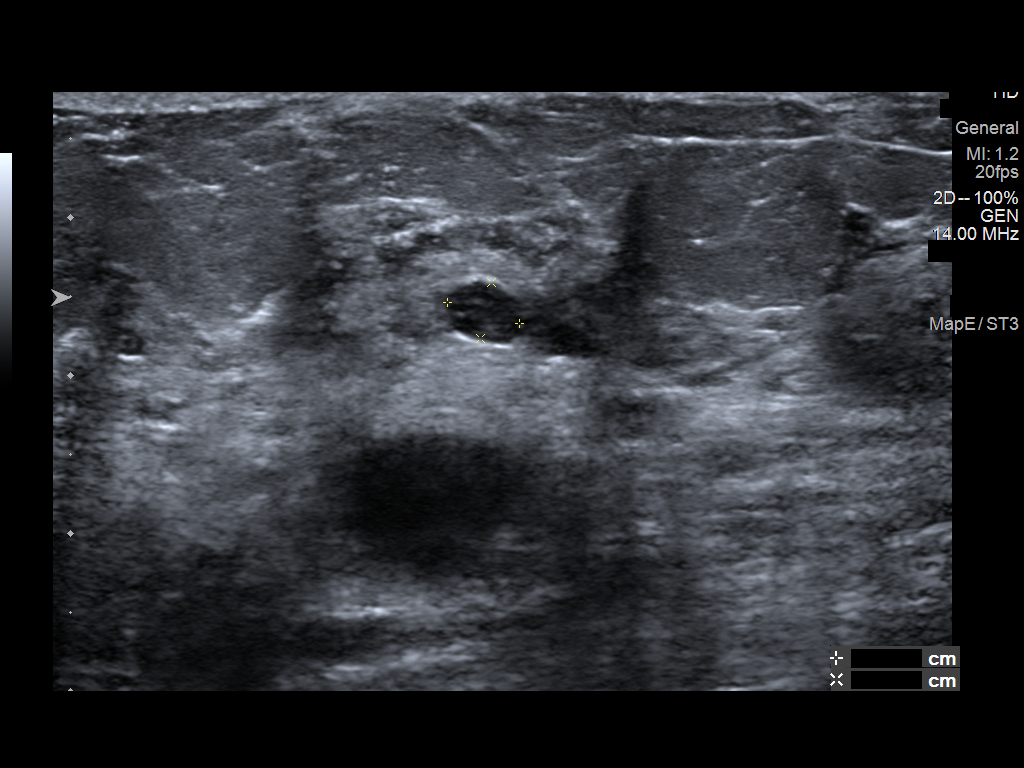
[im 3/6]
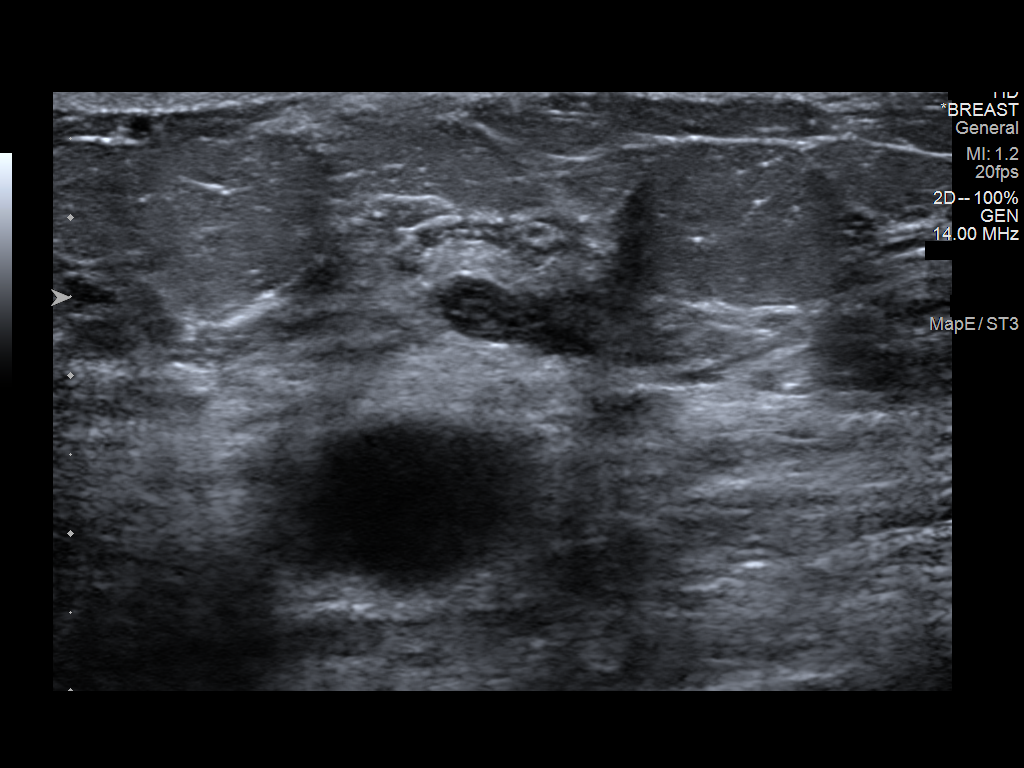
[im 4/6]
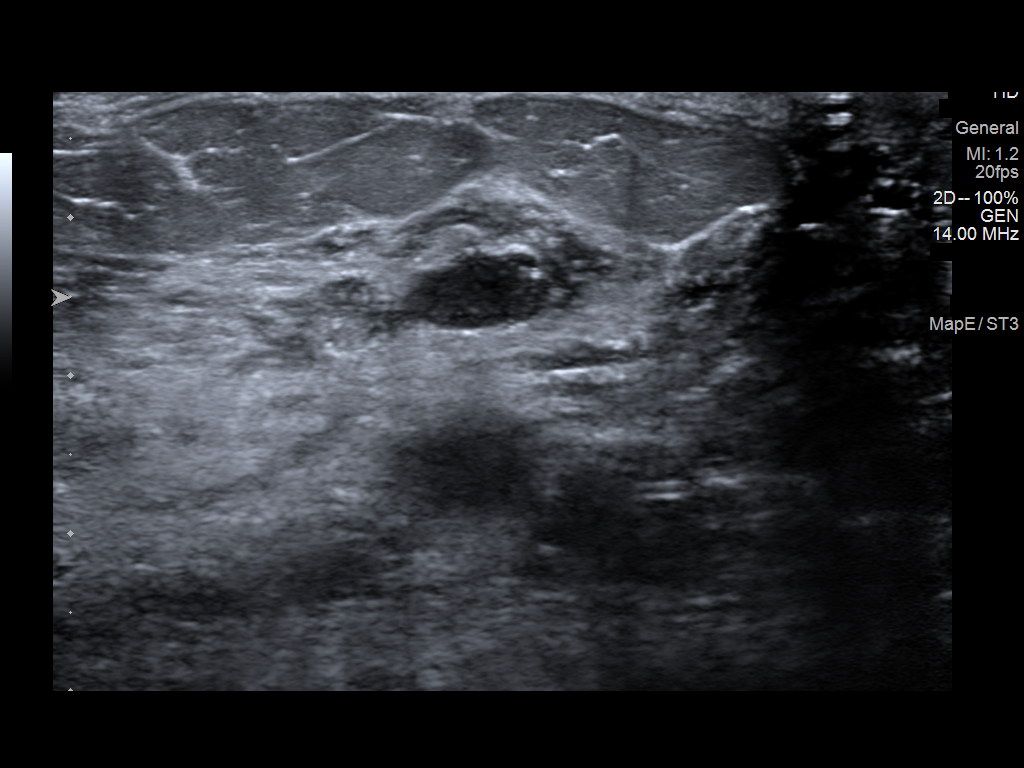
[im 5/6]
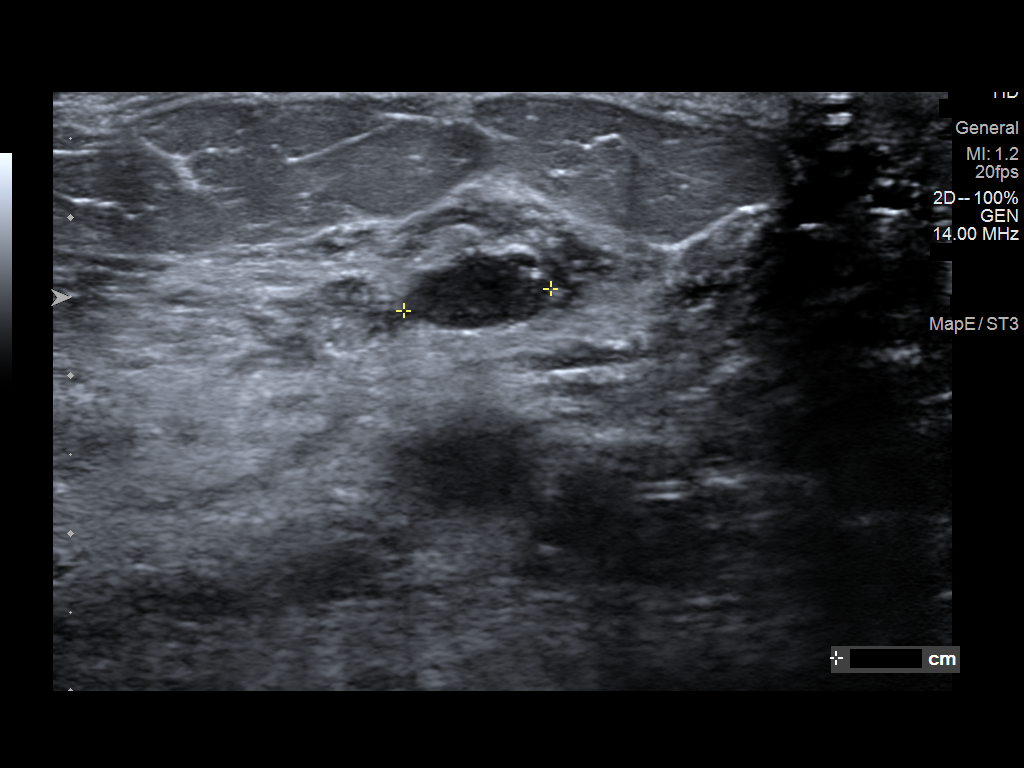
[im 6/6]
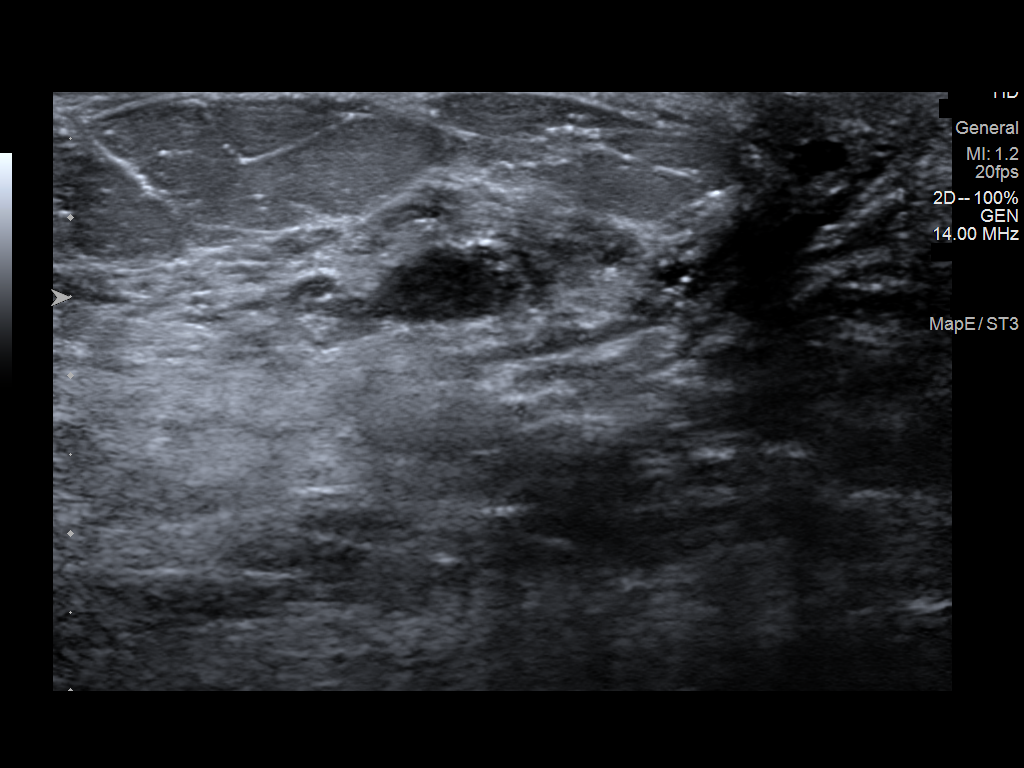

[6 of 6 positions shown; findings below may reference images not displayed]

FINDINGS: Targeted ultrasound is performed, showing a 9 x 5 x 4 mm oval,
hypoechoic, circumscribed mass in the 12:30 o'clock position of the
right breast, 1 cm from the nipple. This has a punctate eccentric
calcification today. This is slightly more hypoechoic and smaller
than seen on 02/10/2018. At that time, this measured 10 x 7 x 6 mm.
A previously demonstrated cyst more posteriorly in that region of
the breast is partially included today.
IMPRESSION: Interval decrease in size of a probably benign mass in the 12:30
o'clock position of the right breast. This most likely represents a
cyst containing thick fluid.

RECOMMENDATION:
Bilateral diagnostic mammogram and right breast ultrasound in 5
months. That will be 1 year since mammographic evaluation of left
breast.

I have discussed the findings and recommendations with the patient.
Results were also provided in writing at the conclusion of the
visit. If applicable, a reminder letter will be sent to the patient
regarding the next appointment.

BI-RADS CATEGORY  3: Probably benign.

## 2020-06-14 ENCOUNTER — Other Ambulatory Visit
Admission: RE | Admit: 2020-06-14 | Discharge: 2020-06-14 | Disposition: A | Discharge status: Home / Self Care | Payer: BC Managed Care – PPO | Source: Ambulatory Visit | Attending: Internal Medicine | Admitting: Internal Medicine

## 2020-06-14 ENCOUNTER — Other Ambulatory Visit: Payer: Self-pay

## 2020-06-14 DIAGNOSIS — Z01812 Encounter for preprocedural laboratory examination: Secondary | ICD-10-CM | POA: Diagnosis present

## 2020-06-14 DIAGNOSIS — Z20822 Contact with and (suspected) exposure to covid-19: Secondary | ICD-10-CM | POA: Diagnosis not present

## 2020-06-14 LAB — SARS CORONAVIRUS 2 (TAT 6-24 HRS): SARS Coronavirus 2: NEGATIVE

## 2020-06-15 ENCOUNTER — Encounter: Payer: Self-pay | Admitting: Internal Medicine

## 2020-06-16 ENCOUNTER — Encounter
Admission: RE | Disposition: A | Discharge status: Home / Self Care | Payer: Self-pay | Source: Home / Self Care | Attending: Internal Medicine

## 2020-06-16 ENCOUNTER — Encounter: Payer: Self-pay | Admitting: Internal Medicine

## 2020-06-16 ENCOUNTER — Ambulatory Visit
Admission: RE | Admit: 2020-06-16 | Discharge: 2020-06-16 | Disposition: A | Discharge status: Home / Self Care | Payer: BC Managed Care – PPO | Attending: Internal Medicine | Admitting: Internal Medicine

## 2020-06-16 ENCOUNTER — Ambulatory Visit: Payer: BC Managed Care – PPO | Admitting: Certified Registered"

## 2020-06-16 ENCOUNTER — Other Ambulatory Visit: Payer: Self-pay

## 2020-06-16 DIAGNOSIS — D122 Benign neoplasm of ascending colon: Secondary | ICD-10-CM | POA: Insufficient documentation

## 2020-06-16 DIAGNOSIS — K222 Esophageal obstruction: Secondary | ICD-10-CM | POA: Diagnosis not present

## 2020-06-16 DIAGNOSIS — Z1211 Encounter for screening for malignant neoplasm of colon: Secondary | ICD-10-CM | POA: Insufficient documentation

## 2020-06-16 DIAGNOSIS — D124 Benign neoplasm of descending colon: Secondary | ICD-10-CM | POA: Diagnosis not present

## 2020-06-16 DIAGNOSIS — K2100 Gastro-esophageal reflux disease with esophagitis, without bleeding: Secondary | ICD-10-CM | POA: Diagnosis not present

## 2020-06-16 DIAGNOSIS — Z79890 Hormone replacement therapy (postmenopausal): Secondary | ICD-10-CM | POA: Diagnosis not present

## 2020-06-16 DIAGNOSIS — J45909 Unspecified asthma, uncomplicated: Secondary | ICD-10-CM | POA: Insufficient documentation

## 2020-06-16 DIAGNOSIS — E039 Hypothyroidism, unspecified: Secondary | ICD-10-CM | POA: Diagnosis not present

## 2020-06-16 DIAGNOSIS — Z800 Family history of malignant neoplasm of digestive organs: Secondary | ICD-10-CM | POA: Insufficient documentation

## 2020-06-16 DIAGNOSIS — K640 First degree hemorrhoids: Secondary | ICD-10-CM | POA: Insufficient documentation

## 2020-06-16 DIAGNOSIS — R1314 Dysphagia, pharyngoesophageal phase: Secondary | ICD-10-CM | POA: Diagnosis present

## 2020-06-16 DIAGNOSIS — Z79899 Other long term (current) drug therapy: Secondary | ICD-10-CM | POA: Insufficient documentation

## 2020-06-16 DIAGNOSIS — Z7951 Long term (current) use of inhaled steroids: Secondary | ICD-10-CM | POA: Diagnosis not present

## 2020-06-16 DIAGNOSIS — F329 Major depressive disorder, single episode, unspecified: Secondary | ICD-10-CM | POA: Insufficient documentation

## 2020-06-16 DIAGNOSIS — Z8371 Family history of colonic polyps: Secondary | ICD-10-CM | POA: Diagnosis not present

## 2020-06-16 HISTORY — DX: Nontoxic single thyroid nodule: E04.1

## 2020-06-16 HISTORY — PX: ESOPHAGOGASTRODUODENOSCOPY (EGD) WITH PROPOFOL: SHX5813

## 2020-06-16 HISTORY — DX: Depression, unspecified: F32.A

## 2020-06-16 HISTORY — PX: COLONOSCOPY WITH PROPOFOL: SHX5780

## 2020-06-16 LAB — POCT PREGNANCY, URINE: Preg Test, Ur: NEGATIVE

## 2020-06-16 SURGERY — COLONOSCOPY WITH PROPOFOL
Anesthesia: General

## 2020-06-16 MED ORDER — LIDOCAINE HCL (PF) 2 % IJ SOLN
INTRAMUSCULAR | Status: AC
  Filled 2020-06-16: qty 5

## 2020-06-16 MED ORDER — IPRATROPIUM-ALBUTEROL 0.5-2.5 (3) MG/3ML IN SOLN: 3.0000 mL | Freq: Once | RESPIRATORY_TRACT | Status: DC

## 2020-06-16 MED ORDER — PROPOFOL 500 MG/50ML IV EMUL
INTRAVENOUS | Status: DC | PRN
  Administered 2020-06-16: 110 mg via INTRAVENOUS
  Administered 2020-06-16 (×2): 60 mg via INTRAVENOUS
  Administered 2020-06-16: 150 mg via INTRAVENOUS

## 2020-06-16 MED ORDER — LABETALOL HCL 5 MG/ML IV SOLN
INTRAVENOUS | Status: AC
  Filled 2020-06-16: qty 4

## 2020-06-16 MED ORDER — LABETALOL HCL 5 MG/ML IV SOLN
INTRAVENOUS | Status: DC | PRN
  Administered 2020-06-16 (×2): 10 mg via INTRAVENOUS

## 2020-06-16 MED ORDER — LIDOCAINE HCL (CARDIAC) PF 100 MG/5ML IV SOSY
PREFILLED_SYRINGE | INTRAVENOUS | Status: DC | PRN
  Administered 2020-06-16: 100 mg via INTRAVENOUS

## 2020-06-16 MED ORDER — IPRATROPIUM-ALBUTEROL 0.5-2.5 (3) MG/3ML IN SOLN
RESPIRATORY_TRACT | Status: AC
  Administered 2020-06-16: 3 mL
  Filled 2020-06-16: qty 3

## 2020-06-16 MED ORDER — PROPOFOL 500 MG/50ML IV EMUL
INTRAVENOUS | Status: AC
  Filled 2020-06-16: qty 50

## 2020-06-16 MED ORDER — SODIUM CHLORIDE 0.9 % IV SOLN: INTRAVENOUS | Status: DC

## 2020-06-16 NOTE — H&P (Signed)
Outpatient short stay form Pre-procedure 06/16/2020 9:06 AM Valerie Welch [name], M.D.  Primary Physician: Tracie Harrier, M.D.  Reason for visit:  Hx of Barrett's esophagus, Dysphagia, GERD, family history of colon polyps  History of present illness:  Patient has intermittent solid food dysphagia, ? Hx of Barrett's esophagus though last EGD c. 2018 showed no Barrett's. Denies abdominal pain, melena, anorexia, nausea or vomiting. PPI works well to control GERD symptoms.  49 year old patient presenting for family history of colon cancer. Patient denies any rectal bleeding or involuntary weight loss.She has mild constipation.     No current facility-administered medications for this encounter.  Medications Prior to Admission  Medication Sig Dispense Refill Last Dose  . Ascorbic Acid (VITAMIN C) 1000 MG tablet Take 1,000 mg by mouth daily.     Marland Kitchen acetaminophen (TYLENOL) 500 MG tablet Take 1,000 mg by mouth daily as needed for moderate pain or headache. (Patient not taking: Reported on 06/15/2020)     . ALPRAZolam (XANAX) 0.25 MG tablet Take 0.25 mg by mouth at bedtime as needed for anxiety or sleep.      Marland Kitchen azelastine (ASTELIN) 0.1 % nasal spray Place 1 spray into both nostrils daily as needed for allergies.      . Bepotastine Besilate (BEPREVE) 1.5 % SOLN Place 1 drop into both eyes in the morning and at bedtime.      Marland Kitchen buPROPion (WELLBUTRIN XL) 150 MG 24 hr tablet Take 150 mg by mouth daily.     . cetirizine (ZYRTEC) 10 MG tablet Take 10 mg by mouth daily.     . cetirizine (ZYRTEC) 10 MG tablet TAKE 1 TABLET BY MOUTH EVERY DAY (Patient not taking: Reported on 06/15/2020)     . Cyanocobalamin (B-12) 1000 MCG/ML KIT Inject 1,000 mcg as directed every 30 (thirty) days.      . diclofenac sodium (VOLTAREN) 1 % GEL Apply 1 application topically daily as needed (pain).      . DULoxetine (CYMBALTA) 60 MG capsule Take 1 capsule (60 mg total) by mouth daily. 30 capsule 5   . Fluticasone-Salmeterol (ADVAIR)  250-50 MCG/DOSE AEPB Inhale 1 puff into the lungs daily as needed (shortness of breath).      . gabapentin (NEURONTIN) 300 MG capsule 300 mg in the morning, 300 mg in the afternoon, 600 mg nightly 120 capsule 5   . HYDROcodone-Acetaminophen 5-300 MG TABS Take by mouth. (Patient not taking: Reported on 06/15/2020)     . ibuprofen (ADVIL,MOTRIN) 800 MG tablet Take 800 mg by mouth daily as needed for moderate pain.     . iron polysaccharides (NIFEREX) 150 MG capsule Take 150 mg by mouth at bedtime.     Marland Kitchen levonorgestrel (MIRENA) 20 MCG/24HR IUD 1 each by Intrauterine route once.     Marland Kitchen levothyroxine (SYNTHROID, LEVOTHROID) 25 MCG tablet Take 25 mcg by mouth daily before breakfast.     . megestrol (MEGACE) 40 MG tablet TAKE 1 TABLET BY MOUTH TWICE A DAY FOR 21 DAYS (Patient taking differently: Take 40 mg by mouth 2 (two) times daily. ) 42 tablet 1   . Melatonin 5 MG CAPS Take 5 mg by mouth at bedtime as needed (sleep).     . montelukast (SINGULAIR) 10 MG tablet Take 10 mg by mouth at bedtime.     . Multiple Vitamin (MULTIVITAMIN WITH MINERALS) TABS tablet Take 1 tablet by mouth daily at 12 noon.     Marland Kitchen omeprazole (PRILOSEC) 20 MG capsule Take 20 mg by  mouth 2 (two) times daily before a meal.      . PAZEO 0.7 % SOLN Place 1 drop into both eyes daily as needed for allergies. (Patient not taking: Reported on 06/02/2020)  5   . polyethylene glycol (MIRALAX / GLYCOLAX) 17 g packet Take 17 g by mouth daily.     . rosuvastatin (CRESTOR) 5 MG tablet Take 5 mg by mouth at bedtime.     . trolamine salicylate (ASPERCREME) 10 % cream Apply 1 application topically as needed for muscle pain.        Allergies  Allergen Reactions  . Beef-Derived Products Swelling  . Other     Potato starch - lots of flem  . Percocet [Oxycodone-Acetaminophen] Nausea And Vomiting  . Pitocin [Oxytocin]     halucinations      Past Medical History:  Diagnosis Date  . Anemia   . Asthma   . Barrett's esophagus   . Bursitis     hip, knees, shoulders  . Depression   . GERD (gastroesophageal reflux disease)   . History of hiatal hernia   . Hypothyroidism   . Nontoxic uninodular goiter     Review of systems:  Otherwise negative.    Physical Exam  Gen: Alert, oriented. Appears stated age.  HEENT: [place]/AT. PERRLA. Lungs: CTA, no wheezes. CV: RR nl S1, S2. Abd: soft, benign, no masses. BS+ Ext: No edema. Pulses 2+    Planned procedures: Proceed with EGD and colonoscopy. The patient understands the nature of the planned procedure, indications, risks, alternatives and potential complications including but not limited to bleeding, infection, perforation, damage to internal organs and possible oversedation/side effects from anesthesia. The patient agrees and gives consent to proceed.  Please refer to procedure notes for findings, recommendations and patient disposition/instructions.     [name] [name], M.D. Gastroenterology 06/16/2020  9:06 AM

## 2020-06-16 NOTE — Anesthesia Postprocedure Evaluation (Signed)
Anesthesia Post Note  Patient: Valerie Welch  Procedure(s) Performed: COLONOSCOPY WITH PROPOFOL (N/A ) ESOPHAGOGASTRODUODENOSCOPY (EGD) WITH PROPOFOL (N/A )  Patient location during evaluation: Endoscopy Anesthesia Type: General Level of consciousness: awake and alert Pain management: pain level controlled Vital Signs Assessment: post-procedure vital signs reviewed and stable Respiratory status: spontaneous breathing, nonlabored ventilation, respiratory function stable and patient connected to nasal cannula oxygen Cardiovascular status: blood pressure returned to baseline and stable Postop Assessment: no apparent nausea or vomiting Anesthetic complications: no   No complications documented.   Last Vitals:  Vitals:   06/16/20 1055 06/16/20 1105  BP: (!) 148/98 (!) 150/91  Pulse: 85 76  Resp: 14 13  Temp:    SpO2: 100%     Last Pain:  Vitals:   06/16/20 1105  TempSrc:   PainSc: 5                  Precious Haws Wallace Gappa

## 2020-06-16 NOTE — Interval H&P Note (Signed)
History and Physical Interval Note:  06/16/2020 9:08 AM  Valerie Welch  has presented today for surgery, with the diagnosis of ESOPHAGEAL DYSPHAGIA CHANGE IN BOWEL HABITS.  The various methods of treatment have been discussed with the patient and family. After consideration of risks, benefits and other options for treatment, the patient has consented to  Procedure(s): COLONOSCOPY WITH PROPOFOL (N/A) ESOPHAGOGASTRODUODENOSCOPY (EGD) WITH PROPOFOL (N/A) as a surgical intervention.  The patient's history has been reviewed, patient examined, no change in status, stable for surgery.  I have reviewed the patient's chart and labs.  Questions were answered to the patient's satisfaction.     [place], [place]

## 2020-06-16 NOTE — Op Note (Signed)
[facility] Gastroenterology Patient Name: Valerie Welch Procedure Date: 06/16/2020 9:28 AM MRN: [id number] Account #: [id number] Date of Birth: 1971-11-10 Admit Type: Outpatient Age: 49 Room: [facility] ENDO ROOM 1 Gender: Female Note Status: Finalized Procedure:             Upper GI endoscopy Indications:           Esophageal dysphagia, Gastro-esophageal reflux disease Providers:             Benay Pike. Treyvin Glidden MD, MD Medicines:             Propofol per Anesthesia Complications:         No immediate complications. Procedure:             Pre-Anesthesia Assessment:                        - The risks and benefits of the procedure and the                         sedation options and risks were discussed with the                         patient. All questions were answered and informed                         consent was obtained.                        - Patient identification and proposed procedure were                         verified prior to the procedure by the nurse. The                         procedure was verified in the procedure room.                        - ASA Grade Assessment: III - A patient with severe                         systemic disease.                        - After reviewing the risks and benefits, the patient                         was deemed in satisfactory condition to undergo the                         procedure.                        After obtaining informed consent, the endoscope was                         passed under direct vision. Throughout the procedure,                         the patient's blood pressure, pulse, and oxygen  saturations were monitored continuously. The Endoscope                         was introduced through the mouth, and advanced to the                         third part of duodenum. The upper GI endoscopy was                         accomplished without difficulty. The patient  tolerated                         the procedure well. Findings:      One benign-appearing, intrinsic moderate stenosis was found at the       gastroesophageal junction. This stenosis measured 1.2 cm (inner       diameter) x less than one cm (in length). The stenosis was traversed.       The scope was withdrawn. Dilation was performed with a Maloney dilator       with moderate resistance at 54 Fr.      A 3 cm hiatal hernia was present.      The examined duodenum was normal.      The exam was otherwise without abnormality. Impression:            - Benign-appearing esophageal stenosis. Dilated.                        - 3 cm hiatal hernia.                        - Normal examined duodenum.                        - The examination was otherwise normal.                        - No specimens collected. Recommendation:        - Continue present medications.                        - Monitor results to esophageal dilation                        - Proceed with colonoscopy Procedure Code(s):     --- Professional ---                        [phone], Esophagogastroduodenoscopy, flexible,                         transoral; diagnostic, including collection of                         specimen(s) by brushing or washing, when performed                         (separate procedure)                        43450, Dilation of esophagus, by unguided sound or  bougie, single or multiple passes Diagnosis Code(s):     --- Professional ---                        K21.9, Gastro-esophageal reflux disease without                         esophagitis                        R13.14, Dysphagia, pharyngoesophageal phase                        K44.9, Diaphragmatic hernia without obstruction or                         gangrene                        K22.2, Esophageal obstruction CPT copyright 2019 American Medical Association. All rights reserved. The codes documented in this report are preliminary and  upon coder review may  be revised to meet current compliance requirements. Efrain Sella MD, MD 06/16/2020 10:17:04 AM This report has been signed electronically. Number of Addenda: 0 Note Initiated On: 06/16/2020 9:28 AM Estimated Blood Loss:  Estimated blood loss: none.      [facility]

## 2020-06-16 NOTE — Transfer of Care (Signed)
Immediate Anesthesia Transfer of Care Note  Patient: Valerie Welch  Procedure(s) Performed: COLONOSCOPY WITH PROPOFOL (N/A ) ESOPHAGOGASTRODUODENOSCOPY (EGD) WITH PROPOFOL (N/A )  Patient Location: PACU  Anesthesia Type:General  Level of Consciousness: sedated, drowsy and patient cooperative  Airway & Oxygen Therapy: Patient Spontanous Breathing and Patient connected to face mask oxygen  Post-op Assessment: Report given to RN and Post -op Vital signs reviewed and stable  Post vital signs: Reviewed and stable  Last Vitals:  Vitals Value Taken Time  BP 138/95 06/16/20 1035  Temp    Pulse 85 06/16/20 1035  Resp 26 06/16/20 1035  SpO2 100 % 06/16/20 1035  Vitals shown include unvalidated device data.  Last Pain:  Vitals:   06/16/20 1035  TempSrc: (P) Temporal         Complications: No complications documented.

## 2020-06-16 NOTE — Anesthesia Preprocedure Evaluation (Signed)
Anesthesia Evaluation  Patient identified by MRN, date of birth, ID band Patient awake    Reviewed: Allergy & Precautions, H&P , NPO status , Patient's Chart, lab work & pertinent test results  History of Anesthesia Complications Negative for: history of anesthetic complications  Airway Mallampati: III  TM Distance: >3 FB Neck ROM: full    Dental  (+) Chipped, Poor Dentition   Pulmonary asthma ,    Pulmonary exam normal        Cardiovascular Exercise Tolerance: Good (-) angina(-) Past MI and (-) DOE negative cardio ROS Normal cardiovascular exam     Neuro/Psych PSYCHIATRIC DISORDERS  Neuromuscular disease negative psych ROS   GI/Hepatic Neg liver ROS, hiatal hernia, GERD  ,  Endo/Other  Hypothyroidism   Renal/GU negative Renal ROS  negative genitourinary   Musculoskeletal   Abdominal   Peds  Hematology negative hematology ROS (+)   Anesthesia Other Findings Past Medical History: No date: Anemia No date: Asthma No date: Barrett's esophagus No date: Bursitis     Comment:  hip, knees, shoulders No date: Depression No date: GERD (gastroesophageal reflux disease) No date: History of hiatal hernia No date: Hypothyroidism No date: Nontoxic uninodular goiter  Past Surgical History: 09/26/2018: ANKLE ARTHROSCOPY; Right     Comment:  Procedure: ANKLE ARTHROSCOPY/OCD REPAIR  &  DEBRIDEMENT;              EXTENSIVE;  Surgeon: [name], DPM;  Location: [facility]              ORS;  Service: Podiatry;  Laterality: Right; 09/26/2018: ARTHRODESIS METATARSALPHALANGEAL JOINT (MTPJ); Right     Comment:  Procedure: ARTHRODESIS METATARSALPHALANGEAL JOINT (MTPJ)              FUSION;  Surgeon: [name], DPM;  Location: [facility]               ORS;  Service: Podiatry;  Laterality: Right; No date: CESAREAN SECTION     Comment:  x2 No date: CHOLECYSTECTOMY No date: CHOLECYSTECTOMY, LAPAROSCOPIC 09/26/2018: EXOSTECTECTOMY TOE;  Right     Comment:  Procedure: EXOSTECTECTOMY TOE-2ND MTPJ;  Surgeon:               [name], DPM;  Location: [facility] ORS;  Service:               Podiatry;  Laterality: Right; 09/26/2018: TENOTOMY / FLEXOR TENDON TRANSFER; Right     Comment:  Procedure: FLEXOR TENDON TRANSFER; DEEP;  Surgeon:               [name], DPM;  Location: [facility] ORS;  Service:               Podiatry;  Laterality: Right;     Reproductive/Obstetrics negative OB ROS                             Anesthesia Physical Anesthesia Plan  ASA: III  Anesthesia Plan: General   Post-op Pain Management:    Induction: Intravenous  PONV Risk Score and Plan: Propofol infusion and TIVA  Airway Management Planned: Natural Airway and Nasal Cannula  Additional Equipment:   Intra-op Plan:   Post-operative Plan:   Informed Consent: I have reviewed the patients History and Physical, chart, labs and discussed the procedure including the risks, benefits and alternatives for the proposed anesthesia with the patient or authorized representative who has indicated his/her understanding and acceptance.     Dental  Advisory Given  Plan Discussed with: Anesthesiologist, CRNA and Surgeon  Anesthesia Plan Comments: (Patient consented for risks of anesthesia including but not limited to:  - adverse reactions to medications - risk of intubation if required - damage to eyes, teeth, lips or other oral mucosa - nerve damage due to positioning  - sore throat or hoarseness - Damage to heart, brain, nerves, lungs, other parts of body or loss of life  Patient voiced understanding.)        Anesthesia Quick Evaluation

## 2020-06-16 NOTE — Op Note (Signed)
[facility] Gastroenterology Patient Name: Valerie Welch Procedure Date: 06/16/2020 9:27 AM MRN: [id number] Account #: [id number] Date of Birth: May 13, 1971 Admit Type: Outpatient Age: 49 Room: [facility] ENDO ROOM 1 Gender: Female Note Status: Finalized Procedure:             Colonoscopy Indications:           Colon cancer screening in patient at increased risk:                         Family history of 1st-degree relative with colon polyps Providers:             Benay Pike. Soren Pigman MD, MD Medicines:             Propofol per Anesthesia Complications:         No immediate complications. Procedure:             Pre-Anesthesia Assessment:                        - The risks and benefits of the procedure and the                         sedation options and risks were discussed with the                         patient. All questions were answered and informed                         consent was obtained.                        - Patient identification and proposed procedure were                         verified prior to the procedure by the nurse. The                         procedure was verified in the procedure room.                        - ASA Grade Assessment: III - A patient with severe                         systemic disease.                        - After reviewing the risks and benefits, the patient                         was deemed in satisfactory condition to undergo the                         procedure.                        After obtaining informed consent, the colonoscope was                         passed under direct vision. Throughout the procedure,  the patient's blood pressure, pulse, and oxygen                         saturations were monitored continuously. The                         Colonoscope was introduced through the anus and                         advanced to the the cecum, identified by appendiceal                          orifice and ileocecal valve. The colonoscopy was                         performed without difficulty. The patient tolerated                         the procedure well. The quality of the bowel                         preparation was adequate. The ileocecal valve,                         appendiceal orifice, and rectum were photographed. Findings:      The perianal and digital rectal examinations were normal. Pertinent       negatives include normal sphincter tone and no palpable rectal lesions.      A 4 mm polyp was found in the descending colon. The polyp was sessile.       The polyp was removed with a jumbo cold forceps. Resection and retrieval       were complete.      A 6 mm polyp was found in the ascending colon. The polyp was sessile.       The polyp was removed with a cold snare. Resection and retrieval were       complete.      Non-bleeding internal hemorrhoids were found during retroflexion. The       hemorrhoids were Grade I (internal hemorrhoids that do not prolapse).      The exam was otherwise without abnormality. Impression:            - One 4 mm polyp in the descending colon, removed with                         a jumbo cold forceps. Resected and retrieved.                        - One 6 mm polyp in the ascending colon, removed with                         a cold snare. Resected and retrieved.                        - Non-bleeding internal hemorrhoids.                        - The examination was otherwise normal. Recommendation:        - Monitor results to esophageal  dilation                        - Patient has a contact number available for                         emergencies. The signs and symptoms of potential                         delayed complications were discussed with the patient.                         Return to normal activities tomorrow. Written                         discharge instructions were provided to the patient.                        -  Resume previous diet.                        - Continue present medications.                        - Repeat colonoscopy is recommended for surveillance.                         The colonoscopy date will be determined after                         pathology results from today's exam become available                         for review.                        - Return to GI office in 6 weeks.                        - Follow up with Stephens November, GI Nurse                         Practioner, in office to discuss results and monitor                         progress.                        - The findings and recommendations were discussed with                         the patient. Procedure Code(s):     --- Professional ---                        [phone], Colonoscopy, flexible; with removal of                         tumor(s), polyp(s), or other lesion(s) by snare                         technique  04591, 34, Colonoscopy, flexible; with biopsy, single                         or multiple Diagnosis Code(s):     --- Professional ---                        K64.0, First degree hemorrhoids                        K63.5, Polyp of colon                        Z83.71, Family history of colonic polyps CPT copyright 2019 American Medical Association. All rights reserved. The codes documented in this report are preliminary and upon coder review may  be revised to meet current compliance requirements. Efrain Sella MD, MD 06/16/2020 10:33:01 AM This report has been signed electronically. Number of Addenda: 0 Note Initiated On: 06/16/2020 9:27 AM Scope Withdrawal Time: 0 hours 7 minutes 14 seconds  Total Procedure Duration: 0 hours 10 minutes 7 seconds  Estimated Blood Loss:  Estimated blood loss: none.      [facility]

## 2020-06-17 ENCOUNTER — Other Ambulatory Visit: Payer: Self-pay | Admitting: Obstetrics & Gynecology

## 2020-06-17 ENCOUNTER — Encounter: Payer: Self-pay | Admitting: Internal Medicine

## 2020-06-17 LAB — SURGICAL PATHOLOGY

## 2020-06-21 ENCOUNTER — Encounter: Payer: Self-pay | Admitting: Anesthesiology

## 2020-06-23 NOTE — Patient Instructions (Addendum)
DUE TO COVID-19 ONLY ONE VISITOR IS ALLOWED IN WAITING ROOM (VISITOR WILL HAVE A TEMPERATURE CHECK ON ARRIVAL AND MUST WEAR A FACE MASK THE ENTIRE TIME.)  ONCE YOU ARE ADMITTED TO YOUR PRIVATE ROOM, THE SAME ONE VISITOR IS ALLOWED TO VISIT DURING VISITING HOURS ONLY.  Your COVID swab testing is scheduled for: 07/01/20 at  2:55 pm , You must self quarantine after your testing per handout given to you at the testing site.  ([facility] testing enter pre-surgical testing line)    Your procedure is scheduled on:07/05/20  Report to [facility] AT: 6:30  A. M.   Call this number if you have problems the morning of surgery:[phone].   OUR ADDRESS IS [address].  WE ARE LOCATED IN THE [place]                                   [facility].                                     REMEMBER:   DO NOT EAT SOLID FOOD AFTER MIDNIGHT . CLEAR LIQUIDS FROM MIDNIGHT UNTIL 5:30 AM.    CLEAR LIQUID DIET   Foods Allowed                                                                     Foods Excluded  Coffee and tea, regular and decaf                             liquids that you cannot  Plain Jell-O any favor except red or purple                                           see through such as: Fruit ices (not with fruit pulp)                                     milk, soups, orange juice  Iced Popsicles                                    All solid food Carbonated beverages, regular and diet                                    Cranberry, grape and apple juices Sports drinks like Gatorade Lightly seasoned clear broth or consume(fat free) Sugar, honey syrup  Sample Menu Breakfast                                Lunch  Supper Cranberry juice                    Beef broth                            Chicken broth Jell-O                                     Grape juice                            Apple juice Coffee or tea                        Jell-O                                      Popsicle                                                Coffee or tea                        Coffee or tea  _____________________________________________________________________  BRUSH YOUR TEETH THE MORNING OF SURGERY.  TAKE THESE MEDICATIONS MORNING OF SURGERY WITH A SIP OF WATER:  Bupropion,cetirizine,duloxetine,gabapentin,levothyroxine,megestrol,omeprazole.Use inhalers and eye drops as usual.  DO NOT WEAR JEWERLY, MAKE UP, OR NAIL POLISH.  DO NOT WEAR LOTIONS, POWDERS, PERFUMES/COLOGNE OR DEODORANT.  DO NOT SHAVE FOR 24 HOURS PRIOR TO DAY OF SURGERY.  MEN MAY SHAVE FACE AND NECK.  CONTACTS, GLASSES, OR DENTURES MAY NOT BE WORN TO SURGERY.                                    [facility] IS NOT RESPONSIBLE  FOR ANY BELONGINGS.          BRING ALL PRESCRIPTION MEDICATIONS WITH YOU THE DAY OF SURGERY IN ORIGINAL CONTAINERS                                         YOU MAY BRING A SMALL OVERNIGHT BAG                    .[facility] - Preparing for Surgery Before surgery, you can play an important role.  Because skin is not sterile, your skin needs to be as free of germs as possible.  You can reduce the number of germs on your skin by washing with CHG (chlorahexidine gluconate) soap before surgery.  CHG is an antiseptic cleaner which kills germs and bonds with the skin to continue killing germs even after washing. Please DO NOT use if you have an allergy to CHG or antibacterial soaps.  If your skin becomes reddened/irritated stop using the CHG and inform your nurse when you arrive at Short Stay. Do not shave (including legs and underarms) for at least 48 hours prior to the first CHG shower.  You may  shave your face/neck. Please follow these instructions carefully:  1.  Shower with CHG Soap the night before surgery and the  morning of Surgery.  2.  If you choose to wash your hair, wash your hair  first as usual with your  normal  shampoo.  3.  After you shampoo, rinse your hair and body thoroughly to remove the  shampoo.                           4.  Use CHG as you would any other liquid soap.  You can apply chg directly  to the skin and wash                       Gently with a scrungie or clean washcloth.  5.  Apply the CHG Soap to your body ONLY FROM THE NECK DOWN.   Do not use on face/ open                           Wound or open sores. Avoid contact with eyes, ears mouth and genitals (private parts).                       Wash face,  Genitals (private parts) with your normal soap.             6.  Wash thoroughly, paying special attention to the area where your surgery  will be performed.  7.  Thoroughly rinse your body with warm water from the neck down.  8.  DO NOT shower/wash with your normal soap after using and rinsing off  the CHG Soap.                9.  Pat yourself dry with a clean towel.            10.  Wear clean pajamas.            11.  Place clean sheets on your bed the night of your first shower and do not  sleep with pets. Day of Surgery : Do not apply any lotions/deodorants the morning of surgery.  Please wear clean clothes to the hospital/surgery center.  FAILURE TO FOLLOW THESE INSTRUCTIONS MAY RESULT IN THE CANCELLATION OF YOUR SURGERY PATIENT SIGNATURE_________________________________  NURSE SIGNATURE__________________________________  ________________________________________________________________________

## 2020-06-24 ENCOUNTER — Other Ambulatory Visit: Payer: Self-pay

## 2020-06-24 ENCOUNTER — Encounter (HOSPITAL_COMMUNITY)
Admission: RE | Admit: 2020-06-24 | Discharge: 2020-06-24 | Disposition: A | Discharge status: Home / Self Care | Payer: BC Managed Care – PPO | Source: Ambulatory Visit | Attending: Obstetrics & Gynecology | Admitting: Obstetrics & Gynecology

## 2020-06-24 ENCOUNTER — Encounter (HOSPITAL_COMMUNITY): Payer: Self-pay

## 2020-06-24 ENCOUNTER — Other Ambulatory Visit (HOSPITAL_COMMUNITY): Payer: BC Managed Care – PPO

## 2020-06-24 DIAGNOSIS — Z01818 Encounter for other preprocedural examination: Secondary | ICD-10-CM | POA: Diagnosis present

## 2020-06-24 HISTORY — DX: Unspecified osteoarthritis, unspecified site: M19.90

## 2020-06-24 LAB — CBC
HCT: 41.7 % (ref 36.0–46.0)
Hemoglobin: 13.6 g/dL (ref 12.0–15.0)
MCH: 31.6 pg (ref 26.0–34.0)
MCHC: 32.6 g/dL (ref 30.0–36.0)
MCV: 97 fL (ref 80.0–100.0)
Platelets: 344 10*3/uL (ref 150–400)
RBC: 4.3 MIL/uL (ref 3.87–5.11)
RDW: 13.4 % (ref 11.5–15.5)
WBC: 5.6 10*3/uL (ref 4.0–10.5)
nRBC: 0 % (ref 0.0–0.2)

## 2020-06-24 LAB — TYPE AND SCREEN
ABO/RH(D): AB POS
Antibody Screen: NEGATIVE

## 2020-06-24 NOTE — Progress Notes (Addendum)
COVID Vaccine Completed:No Date COVID Vaccine completed: COVID vaccine manufacturer: Pf[employer]    [employer]   PCP - Dr. Tracie Harrier. LOV: 06/14/20 Cardiologist -   Chest x-ray -  EKG -  Stress Test -  ECHO -  Cardiac Cath -   Sleep Study -  CPAP -   Fasting Blood Sugar -  Checks Blood Sugar _____ times a day  Blood Thinner Instructions: Aspirin Instructions: Last Dose:  Anesthesia review: BP was elevated during the PST appointment( 144/108,155/105).PA was notified.  Patient denies shortness of breath, fever, cough and chest pain at PAT appointment   Patient verbalized understanding of instructions that were given to them at the PAT appointment. Patient was also instructed that they will need to review over the PAT instructions again at home before surgery.

## 2020-06-24 NOTE — Progress Notes (Signed)
Anesthesia Chart Review:   Case: [id number] Date/Time: 07/05/20 0815   Procedure: XI ROBOTIC ASSISTED TOTAL LAPAROSCOPIC HYSTERECTOMY AND SALPINGECTOMY (N/A ) - request 8:30am OR time in [place] block requests two hours   Anesthesia type: General   Pre-op diagnosis: menometrorrhagia, intramural/submucous fibroid, pelvic pain   Location: [facility] OR ROOM .5 / [place]   Surgeons: Princess Bruins, MD      DISCUSSION: Pt is a 49 year old with hx asthma  Pt was seen by PCP 06/14/20 for annual well visit. BP 142/80 at that time. On 06/16/20, pt had colonoscopy and EGD at [facility]. Pt reports anesthesia team at [facility] told her she needed a sleep study as she "stopped breathing" and that her BP was high during procedure. At pre-surgical testing 06/24/20, pt's BP 144/108 and 155/105.  Pt denies CV symptoms, headache, dizziness. Pt reports she is active, able to climb 2 flights of stairs without CV sx.    Discussed case with Dr. Clydene Laming. Pt will need PCP f/u for elevated BP and sleep study but this does not need to occur prior to surgery.   EKG and BMP were ordered for pt after she had left pre-surgical testing. Pt will return 07/01/20 for EKG and BMP.    VS: BP (!) 155/105   Pulse 79   Temp 37.3 C (Oral)   Resp 16   Ht '5\' 1"'  (1.549 m)   Wt 85.8 kg   LMP 09/10/2019   SpO2 100%   BMI 35.73 kg/m    PROVIDERS: - PCP is Tracie Harrier, MD   LABS: - CBC 06/24/20 normal - BMP pending   EKG: pending   CV: N/A  Past Medical History:  Diagnosis Date  . Anemia   . Arthritis   . Asthma   . Barrett's esophagus   . Bursitis    hip, knees, shoulders  . Depression   . GERD (gastroesophageal reflux disease)   . History of hiatal hernia   . Hypothyroidism   . Nontoxic uninodular goiter     Past Surgical History:  Procedure Laterality Date  . ANKLE ARTHROSCOPY Right 09/26/2018   Procedure: ANKLE ARTHROSCOPY/OCD REPAIR  &  DEBRIDEMENT; EXTENSIVE;  Surgeon: [name],  DPM;  Location: [facility] ORS;  Service: Podiatry;  Laterality: Right;  . ARTHRODESIS METATARSALPHALANGEAL JOINT (MTPJ) Right 09/26/2018   Procedure: ARTHRODESIS METATARSALPHALANGEAL JOINT (MTPJ) FUSION;  Surgeon: [name], DPM;  Location: [facility] ORS;  Service: Podiatry;  Laterality: Right;  . CESAREAN SECTION     x2  . CHOLECYSTECTOMY    . CHOLECYSTECTOMY, LAPAROSCOPIC    . COLONOSCOPY WITH PROPOFOL N/A 06/16/2020   Procedure: COLONOSCOPY WITH PROPOFOL;  Surgeon: Toledo, Benay Pike, MD;  Location: [facility] ENDOSCOPY;  Service: Gastroenterology;  Laterality: N/A;  . ESOPHAGOGASTRODUODENOSCOPY (EGD) WITH PROPOFOL N/A 06/16/2020   Procedure: ESOPHAGOGASTRODUODENOSCOPY (EGD) WITH PROPOFOL;  Surgeon: Toledo, Benay Pike, MD;  Location: [facility] ENDOSCOPY;  Service: Gastroenterology;  Laterality: N/A;  . EXOSTECTECTOMY TOE Right 09/26/2018   Procedure: EXOSTECTECTOMY TOE-2ND MTPJ;  Surgeon: [name], DPM;  Location: [facility] ORS;  Service: Podiatry;  Laterality: Right;  . TENOTOMY / FLEXOR TENDON TRANSFER Right 09/26/2018   Procedure: FLEXOR TENDON TRANSFER; DEEP;  Surgeon: [name], DPM;  Location: [facility] ORS;  Service: Podiatry;  Laterality: Right;    MEDICATIONS: . acetaminophen (TYLENOL) 500 MG tablet  . ALPRAZolam (XANAX) 0.25 MG tablet  . Ascorbic Acid (VITAMIN C) 1000 MG tablet  . azelastine (ASTELIN) 0.1 % nasal spray  . Bepotastine Besilate (  BEPREVE) 1.5 % SOLN  . buPROPion (WELLBUTRIN XL) 150 MG 24 hr tablet  . cetirizine (ZYRTEC) 10 MG tablet  . cetirizine (ZYRTEC) 10 MG tablet  . Cyanocobalamin (B-12) 1000 MCG/ML KIT  . diclofenac sodium (VOLTAREN) 1 % GEL  . DULoxetine (CYMBALTA) 60 MG capsule  . Fluticasone-Salmeterol (ADVAIR) 250-50 MCG/DOSE AEPB  . gabapentin (NEURONTIN) 300 MG capsule  . HYDROcodone-Acetaminophen 5-300 MG TABS  . ibuprofen (ADVIL,MOTRIN) 800 MG tablet  . iron polysaccharides (NIFEREX) 150 MG capsule  . levonorgestrel (MIRENA) 20 MCG/24HR IUD  . levothyroxine  (SYNTHROID, LEVOTHROID) 25 MCG tablet  . megestrol (MEGACE) 40 MG tablet  . Melatonin 5 MG CAPS  . montelukast (SINGULAIR) 10 MG tablet  . Multiple Vitamin (MULTIVITAMIN WITH MINERALS) TABS tablet  . omeprazole (PRILOSEC) 20 MG capsule  . PAZEO 0.7 % SOLN  . polyethylene glycol (MIRALAX / GLYCOLAX) 17 g packet  . rosuvastatin (CRESTOR) 5 MG tablet  . trolamine salicylate (ASPERCREME) 10 % cream   No current facility-administered medications for this encounter.    If labs and EKG acceptable, I anticipate pt can proceed with surgery as scheduled.  Willeen Cass, FNP-BC [facility] Short Stay Surgical [facility]/Anesthesiology Phone: [phone] 06/24/2020 3:52 PM

## 2020-06-24 NOTE — Progress Notes (Signed)
Pt. Will be back to PST on 06/28/20 for an EKG.

## 2020-06-30 ENCOUNTER — Telehealth (INDEPENDENT_AMBULATORY_CARE_PROVIDER_SITE_OTHER): Payer: BC Managed Care – PPO | Admitting: Obstetrics & Gynecology

## 2020-06-30 ENCOUNTER — Encounter: Payer: Self-pay | Admitting: Obstetrics & Gynecology

## 2020-06-30 ENCOUNTER — Other Ambulatory Visit: Payer: Self-pay

## 2020-06-30 DIAGNOSIS — G5771 Causalgia of right lower limb: Secondary | ICD-10-CM

## 2020-06-30 DIAGNOSIS — N921 Excessive and frequent menstruation with irregular cycle: Secondary | ICD-10-CM | POA: Diagnosis not present

## 2020-06-30 DIAGNOSIS — D219 Benign neoplasm of connective and other soft tissue, unspecified: Secondary | ICD-10-CM | POA: Diagnosis not present

## 2020-06-30 NOTE — Progress Notes (Signed)
DAVONA KINOSHITA 1971/06/04 [id number]        49 y.o.  [id number]  Televisit with Video after verbal consent.  Patient well identified.  Preop counseling between patient in her home and myself in my [place] office x 20 minutes.  RP: Preop XI Robotic TLH/Bilateral Salpingectomy  HPI: CRPS type 2 of Rt lower limb, seen by Neurologic for Periop pain control.  Asthma/Sleep Apnea under sedation for Colonoscopy, will keep overnight for longer observation postop.  Persistent refractory menometrorrhagia.  Just stopped Megace because of worsening HTN on it.    OB History  Gravida Para Term Preterm AB Living  5 2     2 2   SAB TAB Ectopic Multiple Live Births  1            # Outcome Date GA Lbr Len/2nd Weight Sex Delivery Anes PTL Lv  5 Gravida           4 AB           3 SAB           2 Para           1 Para             Past medical history,surgical history, problem list, medications, allergies, family history and social history were all reviewed and documented in the EPIC chart.   Directed ROS with pertinent positives and negatives documented in the history of present illness/assessment and plan.  Exam:  There were no vitals filed for this visit. General appearance:  Normal  Televisit  Pelvic US 04/2020: T/V images. Anteverted uterus enlarged with central fibroid measured at 4.3 x 3.2 x 2.4 cm. The overall uterine size is measured at 8.34 x 5.74 x 4.83 cm. The endometrial cavity is deviated to the left side and distorted by the central fibroid. The IUD is noted within the intrauterine cavity. No obvious endometrial mass but cavity is difficult to evaluate due to distortion. Both ovaries are normal in size with sparse follicles. No adnexal mass. No free fluid in the posterior cul-de-sac.   Assessment/Plan:  49 y.o. [id number]   1. [name] Refractory persistent [name].  Worsening HTN on Megace, patient just stopped it.  Decision to proceed with  XI Robotic  TLH/Bilateral Salpingectomy.  Preop preparation, surgical procedure with risks and benefits as well as postop precautions and expectations thoroughly reviewed with patient.  Patient voiced understanding and agreement.  We will keep patient overnight to observe longer given the history of asthma and apnea under sedation.  2. Fibroid Central IM/SM Fibroid 4.3 cm.  3. Complex regional pain syndrome type II of right lower limb Seen by Neurologist.  Will plan local anesthesia at incision sites, as usual.  Patient prefers Vicodin for pain control at home.                        Patient was counseled as to the risk of surgery to include the following:  1. Infection (prohylactic antibiotics will be administered)  2. DVT/Pulmonary Embolism (prophylactic pneumo compression stockings will be used)  3.Trauma to internal organs requiring additional surgical procedure to repair any injury to internal organs requiring perhaps additional hospitalization days.  4.Hemmorhage requiring transfusion and blood products which carry risks such as anaphylactic reaction, hepatitis and AIDS  Patient had received literature information on the procedure scheduled and all her questions were answered and fully accepts all risk.    Princess Bruins MD, 2:41 PM  06/30/2020     

## 2020-07-01 ENCOUNTER — Encounter (HOSPITAL_COMMUNITY)
Admission: RE | Admit: 2020-07-01 | Discharge: 2020-07-01 | Disposition: A | Discharge status: Home / Self Care | Payer: BC Managed Care – PPO | Source: Ambulatory Visit | Attending: Obstetrics & Gynecology | Admitting: Obstetrics & Gynecology

## 2020-07-01 ENCOUNTER — Other Ambulatory Visit (HOSPITAL_COMMUNITY): Payer: BC Managed Care – PPO

## 2020-07-01 DIAGNOSIS — Z01818 Encounter for other preprocedural examination: Secondary | ICD-10-CM | POA: Insufficient documentation

## 2020-07-01 LAB — BASIC METABOLIC PANEL
Anion gap: 7 (ref 5–15)
BUN: 7 mg/dL (ref 6–20)
CO2: 24 mmol/L (ref 22–32)
Calcium: 9.2 mg/dL (ref 8.9–10.3)
Chloride: 108 mmol/L (ref 98–111)
Creatinine, Ser: 1 mg/dL (ref 0.44–1.00)
GFR calc Af Amer: >60 mL/min (ref >60)
GFR calc non Af Amer: >60 mL/min (ref >60)
Glucose, Bld: 94 mg/dL (ref 70–99)
Potassium: 4.5 mmol/L (ref 3.5–5.1)
Sodium: 139 mmol/L (ref 135–145)

## 2020-07-04 ENCOUNTER — Other Ambulatory Visit (HOSPITAL_COMMUNITY)
Admission: RE | Admit: 2020-07-04 | Discharge: 2020-07-04 | Disposition: A | Discharge status: Home / Self Care | Payer: BC Managed Care – PPO | Source: Ambulatory Visit | Attending: Obstetrics & Gynecology | Admitting: Obstetrics & Gynecology

## 2020-07-04 ENCOUNTER — Other Ambulatory Visit: Payer: BC Managed Care – PPO

## 2020-07-04 DIAGNOSIS — Z01812 Encounter for preprocedural laboratory examination: Secondary | ICD-10-CM | POA: Diagnosis present

## 2020-07-04 DIAGNOSIS — Z20822 Contact with and (suspected) exposure to covid-19: Secondary | ICD-10-CM | POA: Insufficient documentation

## 2020-07-04 LAB — SARS CORONAVIRUS 2 (TAT 6-24 HRS): SARS Coronavirus 2: NEGATIVE

## 2020-07-04 NOTE — Anesthesia Preprocedure Evaluation (Addendum)
Anesthesia Evaluation  Patient identified by MRN, date of birth, ID band Patient awake    Reviewed: Allergy & Precautions, NPO status , Patient's Chart, lab work & pertinent test results  History of Anesthesia Complications (+) AWARENESS UNDER ANESTHESIA  Airway Mallampati: II  TM Distance: >3 FB Neck ROM: Full    Dental no notable dental hx. (+) Teeth Intact, Dental Advisory Given   Pulmonary asthma ,    Pulmonary exam normal breath sounds clear to auscultation       Cardiovascular Exercise Tolerance: Good Normal cardiovascular exam Rhythm:Regular Rate:Normal     Neuro/Psych Anxiety Depression  Neuromuscular disease    GI/Hepatic Neg liver ROS, hiatal hernia, GERD  ,  Endo/Other  Hypothyroidism   Renal/GU negative Renal ROS     Musculoskeletal  (+) Arthritis ,   Abdominal (+) + obese,   Peds  Hematology negative hematology ROS (+) Lab Results      Component                Value               Date                      WBC                      5.6                 06/24/2020                HGB                      13.6                06/24/2020                HCT                      41.7                06/24/2020                MCV                      97.0                06/24/2020                PLT                      344                 06/24/2020              Anesthesia Other Findings   Reproductive/Obstetrics                            Anesthesia Physical Anesthesia Plan  ASA: II  Anesthesia Plan: General   Post-op Pain Management:    Induction: Intravenous  PONV Risk Score and Plan: 4 or greater and Treatment may vary due to age or medical condition, Ondansetron, Dexamethasone and Midazolam  Airway Management Planned: Oral ETT  Additional Equipment:   Intra-op Plan:   Post-operative Plan: Extubation in OR  Informed Consent: I have reviewed the patients History and  Physical, chart, labs and discussed the procedure including  the risks, benefits and alternatives for the proposed anesthesia with the patient or authorized representative who has indicated his/her understanding and acceptance.     Dental advisory given  Plan Discussed with:   Anesthesia Plan Comments: (GA w lidocaine infusion and 0.3-0.4 mcg/kg dexmedetomidine)       Anesthesia Quick Evaluation

## 2020-07-05 ENCOUNTER — Encounter (HOSPITAL_BASED_OUTPATIENT_CLINIC_OR_DEPARTMENT_OTHER): Payer: Self-pay | Admitting: Obstetrics & Gynecology

## 2020-07-05 ENCOUNTER — Ambulatory Visit (HOSPITAL_BASED_OUTPATIENT_CLINIC_OR_DEPARTMENT_OTHER): Payer: BC Managed Care – PPO | Admitting: Anesthesiology

## 2020-07-05 ENCOUNTER — Ambulatory Visit (HOSPITAL_BASED_OUTPATIENT_CLINIC_OR_DEPARTMENT_OTHER): Payer: BC Managed Care – PPO | Admitting: Emergency Medicine

## 2020-07-05 ENCOUNTER — Ambulatory Visit (HOSPITAL_BASED_OUTPATIENT_CLINIC_OR_DEPARTMENT_OTHER)
Admission: RE | Admit: 2020-07-05 | Discharge: 2020-07-06 | Disposition: A | Discharge status: Home / Self Care | Payer: BC Managed Care – PPO | Attending: Obstetrics & Gynecology | Admitting: Obstetrics & Gynecology

## 2020-07-05 ENCOUNTER — Other Ambulatory Visit: Payer: Self-pay

## 2020-07-05 ENCOUNTER — Encounter (HOSPITAL_BASED_OUTPATIENT_CLINIC_OR_DEPARTMENT_OTHER)
Admission: RE | Disposition: A | Discharge status: Home / Self Care | Payer: Self-pay | Source: Home / Self Care | Attending: Obstetrics & Gynecology

## 2020-07-05 DIAGNOSIS — Z79899 Other long term (current) drug therapy: Secondary | ICD-10-CM | POA: Insufficient documentation

## 2020-07-05 DIAGNOSIS — M1990 Unspecified osteoarthritis, unspecified site: Secondary | ICD-10-CM | POA: Insufficient documentation

## 2020-07-05 DIAGNOSIS — Z9889 Other specified postprocedural states: Secondary | ICD-10-CM

## 2020-07-05 DIAGNOSIS — Z888 Allergy status to other drugs, medicaments and biological substances status: Secondary | ICD-10-CM | POA: Diagnosis not present

## 2020-07-05 DIAGNOSIS — D259 Leiomyoma of uterus, unspecified: Secondary | ICD-10-CM

## 2020-07-05 DIAGNOSIS — E039 Hypothyroidism, unspecified: Secondary | ICD-10-CM | POA: Insufficient documentation

## 2020-07-05 DIAGNOSIS — N921 Excessive and frequent menstruation with irregular cycle: Secondary | ICD-10-CM

## 2020-07-05 DIAGNOSIS — K660 Peritoneal adhesions (postprocedural) (postinfection): Secondary | ICD-10-CM | POA: Insufficient documentation

## 2020-07-05 DIAGNOSIS — F329 Major depressive disorder, single episode, unspecified: Secondary | ICD-10-CM | POA: Diagnosis not present

## 2020-07-05 DIAGNOSIS — G4730 Sleep apnea, unspecified: Secondary | ICD-10-CM | POA: Diagnosis not present

## 2020-07-05 DIAGNOSIS — Z793 Long term (current) use of hormonal contraceptives: Secondary | ICD-10-CM | POA: Diagnosis not present

## 2020-07-05 DIAGNOSIS — D251 Intramural leiomyoma of uterus: Secondary | ICD-10-CM | POA: Diagnosis not present

## 2020-07-05 DIAGNOSIS — G5771 Causalgia of right lower limb: Secondary | ICD-10-CM | POA: Insufficient documentation

## 2020-07-05 DIAGNOSIS — D252 Subserosal leiomyoma of uterus: Secondary | ICD-10-CM

## 2020-07-05 DIAGNOSIS — J45909 Unspecified asthma, uncomplicated: Secondary | ICD-10-CM | POA: Insufficient documentation

## 2020-07-05 DIAGNOSIS — K219 Gastro-esophageal reflux disease without esophagitis: Secondary | ICD-10-CM | POA: Insufficient documentation

## 2020-07-05 DIAGNOSIS — Z79890 Hormone replacement therapy (postmenopausal): Secondary | ICD-10-CM | POA: Insufficient documentation

## 2020-07-05 DIAGNOSIS — Z885 Allergy status to narcotic agent status: Secondary | ICD-10-CM | POA: Insufficient documentation

## 2020-07-05 DIAGNOSIS — D250 Submucous leiomyoma of uterus: Secondary | ICD-10-CM | POA: Insufficient documentation

## 2020-07-05 HISTORY — PX: ROBOTIC ASSISTED LAPAROSCOPIC HYSTERECTOMY AND SALPINGECTOMY: SHX6379

## 2020-07-05 LAB — CBC
HCT: 39.6 % (ref 36.0–46.0)
Hemoglobin: 12.8 g/dL (ref 12.0–15.0)
MCH: 31.4 pg (ref 26.0–34.0)
MCHC: 32.3 g/dL (ref 30.0–36.0)
MCV: 97.1 fL (ref 80.0–100.0)
Platelets: 271 10*3/uL (ref 150–400)
RBC: 4.08 MIL/uL (ref 3.87–5.11)
RDW: 13.1 % (ref 11.5–15.5)
WBC: 8.4 10*3/uL (ref 4.0–10.5)
nRBC: 0 % (ref 0.0–0.2)

## 2020-07-05 LAB — ABO/RH: ABO/RH(D): AB POS

## 2020-07-05 LAB — POCT PREGNANCY, URINE: Preg Test, Ur: NEGATIVE

## 2020-07-05 SURGERY — XI ROBOTIC ASSISTED LAPAROSCOPIC HYSTERECTOMY AND SALPINGECTOMY
Anesthesia: General

## 2020-07-05 MED ORDER — DROPERIDOL 2.5 MG/ML IJ SOLN: 0.6250 mg | Freq: Once | INTRAMUSCULAR | Status: DC | PRN

## 2020-07-05 MED ORDER — CEFAZOLIN SODIUM-DEXTROSE 2-4 GM/100ML-% IV SOLN
2.0000 g | INTRAVENOUS | Status: AC
  Administered 2020-07-05: 2 g via INTRAVENOUS

## 2020-07-05 MED ORDER — LACTATED RINGERS IV SOLN: INTRAVENOUS | Status: DC

## 2020-07-05 MED ORDER — PHENYLEPHRINE HCL (PRESSORS) 10 MG/ML IV SOLN
INTRAVENOUS | Status: AC
  Filled 2020-07-05: qty 1

## 2020-07-05 MED ORDER — ACETAMINOPHEN 500 MG PO TABS
1000.0000 mg | ORAL_TABLET | Freq: Four times a day (QID) | ORAL | Status: DC
  Administered 2020-07-05 – 2020-07-06 (×3): 1000 mg via ORAL

## 2020-07-05 MED ORDER — ACETAMINOPHEN 10 MG/ML IV SOLN
1000.0000 mg | Freq: Once | INTRAVENOUS | Status: DC | PRN
  Administered 2020-07-05: 1000 mg via INTRAVENOUS

## 2020-07-05 MED ORDER — ONDANSETRON HCL 4 MG/2ML IJ SOLN: 4.0000 mg | Freq: Once | INTRAMUSCULAR | Status: DC | PRN

## 2020-07-05 MED ORDER — PROPOFOL 10 MG/ML IV BOLUS
INTRAVENOUS | Status: AC
  Filled 2020-07-05: qty 40

## 2020-07-05 MED ORDER — SUGAMMADEX SODIUM 200 MG/2ML IV SOLN
INTRAVENOUS | Status: DC | PRN
  Administered 2020-07-05: 200 mg via INTRAVENOUS

## 2020-07-05 MED ORDER — PHENYLEPHRINE 40 MCG/ML (10ML) SYRINGE FOR IV PUSH (FOR BLOOD PRESSURE SUPPORT)
PREFILLED_SYRINGE | INTRAVENOUS | Status: AC
  Filled 2020-07-05: qty 10

## 2020-07-05 MED ORDER — FENTANYL CITRATE (PF) 100 MCG/2ML IJ SOLN
INTRAMUSCULAR | Status: AC
  Filled 2020-07-05: qty 2

## 2020-07-05 MED ORDER — OXYCODONE HCL 5 MG/5ML PO SOLN: 5.0000 mg | Freq: Once | ORAL | Status: DC | PRN

## 2020-07-05 MED ORDER — DULOXETINE HCL 60 MG PO CPEP
60.0000 mg | ORAL_CAPSULE | Freq: Every day | ORAL | Status: DC
  Administered 2020-07-05: 60 mg via ORAL
  Filled 2020-07-05: qty 1

## 2020-07-05 MED ORDER — HYDROMORPHONE HCL 1 MG/ML IJ SOLN
INTRAMUSCULAR | Status: AC
  Filled 2020-07-05: qty 1

## 2020-07-05 MED ORDER — EPHEDRINE 5 MG/ML INJ
INTRAVENOUS | Status: AC
  Filled 2020-07-05: qty 10

## 2020-07-05 MED ORDER — GABAPENTIN 300 MG PO CAPS
ORAL_CAPSULE | ORAL | Status: AC
  Filled 2020-07-05: qty 2

## 2020-07-05 MED ORDER — ACETAMINOPHEN 500 MG PO TABS
1000.0000 mg | ORAL_TABLET | Freq: Once | ORAL | Status: AC
  Administered 2020-07-05: 1000 mg via ORAL

## 2020-07-05 MED ORDER — GLYCOPYRROLATE 0.2 MG/ML IJ SOLN
INTRAMUSCULAR | Status: DC | PRN
  Administered 2020-07-05: .2 mg via INTRAVENOUS

## 2020-07-05 MED ORDER — OXYCODONE HCL 5 MG PO TABS: 5.0000 mg | ORAL_TABLET | ORAL | Status: DC | PRN

## 2020-07-05 MED ORDER — HYDROMORPHONE HCL 2 MG PO TABS
1.0000 mg | ORAL_TABLET | ORAL | Status: DC | PRN
  Administered 2020-07-05 (×2): 1 mg via ORAL

## 2020-07-05 MED ORDER — FENTANYL CITRATE (PF) 250 MCG/5ML IJ SOLN
INTRAMUSCULAR | Status: AC
  Filled 2020-07-05: qty 5

## 2020-07-05 MED ORDER — OXYCODONE HCL 5 MG PO TABS
ORAL_TABLET | ORAL | Status: AC
  Filled 2020-07-05: qty 2

## 2020-07-05 MED ORDER — HYDROMORPHONE HCL 2 MG PO TABS
ORAL_TABLET | ORAL | Status: AC
  Filled 2020-07-05: qty 1

## 2020-07-05 MED ORDER — ACETAMINOPHEN 500 MG PO TABS
ORAL_TABLET | ORAL | Status: AC
  Filled 2020-07-05: qty 2

## 2020-07-05 MED ORDER — MIDAZOLAM HCL 2 MG/2ML IJ SOLN
INTRAMUSCULAR | Status: AC
  Filled 2020-07-05: qty 2

## 2020-07-05 MED ORDER — FENTANYL CITRATE (PF) 100 MCG/2ML IJ SOLN
INTRAMUSCULAR | Status: DC | PRN
  Administered 2020-07-05: 50 ug via INTRAVENOUS
  Administered 2020-07-05: 100 ug via INTRAVENOUS
  Administered 2020-07-05: 25 ug via INTRAVENOUS
  Administered 2020-07-05 (×4): 50 ug via INTRAVENOUS
  Administered 2020-07-05: 25 ug via INTRAVENOUS
  Administered 2020-07-05: 50 ug via INTRAVENOUS

## 2020-07-05 MED ORDER — LIDOCAINE HCL (CARDIAC) PF 100 MG/5ML IV SOSY
PREFILLED_SYRINGE | INTRAVENOUS | Status: DC | PRN
  Administered 2020-07-05: 40 mg via INTRAVENOUS

## 2020-07-05 MED ORDER — GABAPENTIN 300 MG PO CAPS
600.0000 mg | ORAL_CAPSULE | Freq: Every day | ORAL | Status: DC
  Administered 2020-07-05: 600 mg via ORAL

## 2020-07-05 MED ORDER — ROCURONIUM BROMIDE 100 MG/10ML IV SOLN
INTRAVENOUS | Status: DC | PRN
  Administered 2020-07-05: 20 mg via INTRAVENOUS
  Administered 2020-07-05: 60 mg via INTRAVENOUS
  Administered 2020-07-05: 20 mg via INTRAVENOUS

## 2020-07-05 MED ORDER — DEXMEDETOMIDINE HCL 200 MCG/2ML IV SOLN
INTRAVENOUS | Status: DC | PRN
  Administered 2020-07-05: 8 ug via INTRAVENOUS
  Administered 2020-07-05: 4 ug via INTRAVENOUS
  Administered 2020-07-05 (×2): 8 ug via INTRAVENOUS

## 2020-07-05 MED ORDER — DEXAMETHASONE SODIUM PHOSPHATE 10 MG/ML IJ SOLN
INTRAMUSCULAR | Status: AC
  Filled 2020-07-05: qty 1

## 2020-07-05 MED ORDER — ONDANSETRON HCL 4 MG/2ML IJ SOLN
INTRAMUSCULAR | Status: DC | PRN
  Administered 2020-07-05: 4 mg via INTRAVENOUS

## 2020-07-05 MED ORDER — LIDOCAINE 2% (20 MG/ML) 5 ML SYRINGE
INTRAMUSCULAR | Status: AC
  Filled 2020-07-05: qty 15

## 2020-07-05 MED ORDER — LIDOCAINE 2% (20 MG/ML) 5 ML SYRINGE
INTRAMUSCULAR | Status: DC | PRN
  Administered 2020-07-05: 1.5 mg/kg/h via INTRAVENOUS

## 2020-07-05 MED ORDER — BUPIVACAINE HCL (PF) 0.25 % IJ SOLN
INTRAMUSCULAR | Status: DC | PRN
  Administered 2020-07-05: 15 mL

## 2020-07-05 MED ORDER — PHENYLEPHRINE HCL (PRESSORS) 10 MG/ML IV SOLN
INTRAVENOUS | Status: DC | PRN
  Administered 2020-07-05 (×3): 80 ug via INTRAVENOUS

## 2020-07-05 MED ORDER — HYDROMORPHONE HCL 2 MG PO TABS
1.0000 mg | ORAL_TABLET | Freq: Once | ORAL | Status: AC
  Administered 2020-07-05: 1 mg via ORAL

## 2020-07-05 MED ORDER — KETOROLAC TROMETHAMINE 30 MG/ML IJ SOLN
INTRAMUSCULAR | Status: DC | PRN
  Administered 2020-07-05: 30 mg via INTRAVENOUS

## 2020-07-05 MED ORDER — POLYETHYLENE GLYCOL 3350 17 G PO PACK
PACK | ORAL | Status: AC
  Filled 2020-07-05: qty 1

## 2020-07-05 MED ORDER — DEXAMETHASONE SODIUM PHOSPHATE 4 MG/ML IJ SOLN
INTRAMUSCULAR | Status: DC | PRN
  Administered 2020-07-05: 10 mg via INTRAVENOUS

## 2020-07-05 MED ORDER — EPHEDRINE SULFATE 50 MG/ML IJ SOLN
INTRAMUSCULAR | Status: DC | PRN
  Administered 2020-07-05 (×4): 20 mg via INTRAVENOUS

## 2020-07-05 MED ORDER — SODIUM CHLORIDE 0.9 % IR SOLN
Status: DC | PRN
  Administered 2020-07-05: 3000 mL

## 2020-07-05 MED ORDER — HYDROMORPHONE HCL 1 MG/ML IJ SOLN
0.2500 mg | INTRAMUSCULAR | Status: DC | PRN
  Administered 2020-07-05: 0.25 mg via INTRAVENOUS
  Administered 2020-07-05: 0.5 mg via INTRAVENOUS
  Administered 2020-07-05: 0.25 mg via INTRAVENOUS
  Administered 2020-07-05: 0.5 mg via INTRAVENOUS

## 2020-07-05 MED ORDER — MIDAZOLAM HCL 5 MG/5ML IJ SOLN
INTRAMUSCULAR | Status: DC | PRN
  Administered 2020-07-05 (×2): 1 mg via INTRAVENOUS
  Administered 2020-07-05: 2 mg via INTRAVENOUS

## 2020-07-05 MED ORDER — PROPOFOL 10 MG/ML IV BOLUS
INTRAVENOUS | Status: DC | PRN
  Administered 2020-07-05: 150 mg via INTRAVENOUS

## 2020-07-05 MED ORDER — DEXMEDETOMIDINE HCL IN NACL 200 MCG/50ML IV SOLN
INTRAVENOUS | Status: AC
  Filled 2020-07-05: qty 50

## 2020-07-05 MED ORDER — ORAL CARE MOUTH RINSE: 15.0000 mL | Freq: Once | OROMUCOSAL | Status: DC

## 2020-07-05 MED ORDER — ROCURONIUM BROMIDE 10 MG/ML (PF) SYRINGE
PREFILLED_SYRINGE | INTRAVENOUS | Status: AC
  Filled 2020-07-05: qty 10

## 2020-07-05 MED ORDER — KETOROLAC TROMETHAMINE 30 MG/ML IJ SOLN
INTRAMUSCULAR | Status: AC
  Filled 2020-07-05: qty 1

## 2020-07-05 MED ORDER — POLYETHYLENE GLYCOL 3350 17 G PO PACK
17.0000 g | PACK | Freq: Once | ORAL | Status: AC
  Administered 2020-07-05: 17 g via ORAL

## 2020-07-05 MED ORDER — HYDROMORPHONE HCL 2 MG PO TABS
2.0000 mg | ORAL_TABLET | ORAL | Status: DC | PRN
  Administered 2020-07-05 – 2020-07-06 (×4): 2 mg via ORAL

## 2020-07-05 MED ORDER — ALBUMIN HUMAN 5 % IV SOLN: INTRAVENOUS | Status: DC | PRN

## 2020-07-05 MED ORDER — ONDANSETRON HCL 4 MG/2ML IJ SOLN
INTRAMUSCULAR | Status: AC
  Filled 2020-07-05: qty 2

## 2020-07-05 MED ORDER — GLYCOPYRROLATE PF 0.2 MG/ML IJ SOSY
PREFILLED_SYRINGE | INTRAMUSCULAR | Status: AC
  Filled 2020-07-05: qty 1

## 2020-07-05 MED ORDER — CHLORHEXIDINE GLUCONATE 0.12 % MT SOLN: 15.0000 mL | Freq: Once | OROMUCOSAL | Status: DC

## 2020-07-05 MED ORDER — MONTELUKAST SODIUM 10 MG PO TABS
10.0000 mg | ORAL_TABLET | Freq: Every day | ORAL | Status: DC
  Administered 2020-07-05: 10 mg via ORAL
  Filled 2020-07-05: qty 1

## 2020-07-05 MED ORDER — ACETAMINOPHEN 10 MG/ML IV SOLN
INTRAVENOUS | Status: AC
  Filled 2020-07-05: qty 100

## 2020-07-05 MED ORDER — OXYCODONE HCL 5 MG PO TABS: 5.0000 mg | ORAL_TABLET | Freq: Once | ORAL | Status: DC | PRN

## 2020-07-05 MED ORDER — CEFAZOLIN SODIUM-DEXTROSE 2-4 GM/100ML-% IV SOLN
INTRAVENOUS | Status: AC
  Filled 2020-07-05: qty 100

## 2020-07-05 SURGICAL SUPPLY — 55 items
ADH SKN CLS APL DERMABOND .7 (GAUZE/BANDAGES/DRESSINGS) ×1
BARRIER ADHS 3X4 INTERCEED (GAUZE/BANDAGES/DRESSINGS) IMPLANT
BRR ADH 4X3 ABS CNTRL BYND (GAUZE/BANDAGES/DRESSINGS)
CANISTER SUCT 3000ML PPV (MISCELLANEOUS) ×2 IMPLANT
CATH FOLEY 3WAY  5CC 16FR (CATHETERS) ×2
CATH FOLEY 3WAY 5CC 16FR (CATHETERS) ×1 IMPLANT
COVER BACK TABLE 60X90IN (DRAPES) ×2 IMPLANT
COVER TIP SHEARS 8 DVNC (MISCELLANEOUS) ×2 IMPLANT
COVER TIP SHEARS 8MM DA VINCI (MISCELLANEOUS) ×4
COVER WAND RF STERILE (DRAPES) ×2 IMPLANT
DECANTER SPIKE VIAL GLASS SM (MISCELLANEOUS) ×4 IMPLANT
DEFOGGER SCOPE WARMER CLEARIFY (MISCELLANEOUS) ×2 IMPLANT
DERMABOND ADVANCED (GAUZE/BANDAGES/DRESSINGS) ×1
DERMABOND ADVANCED .7 DNX12 (GAUZE/BANDAGES/DRESSINGS) ×1 IMPLANT
DRAPE ARM DVNC X/XI (DISPOSABLE) ×4 IMPLANT
DRAPE COLUMN DVNC XI (DISPOSABLE) ×1 IMPLANT
DRAPE DA VINCI XI ARM (DISPOSABLE) ×8
DRAPE DA VINCI XI COLUMN (DISPOSABLE) ×2
DRAPE UTILITY XL STRL (DRAPES) ×2 IMPLANT
DURAPREP 26ML APPLICATOR (WOUND CARE) ×2 IMPLANT
ELECT REM PT RETURN 9FT ADLT (ELECTROSURGICAL) ×2
ELECTRODE REM PT RTRN 9FT ADLT (ELECTROSURGICAL) ×1 IMPLANT
GAUZE PETROLATUM 1 X8 (GAUZE/BANDAGES/DRESSINGS) ×2 IMPLANT
GLOVE BIO SURGEON STRL SZ 6.5 (GLOVE) ×6 IMPLANT
GLOVE BIO SURGEON STRL SZ8 (GLOVE) ×2 IMPLANT
GLOVE BIOGEL PI IND STRL 7.0 (GLOVE) ×5 IMPLANT
GLOVE BIOGEL PI IND STRL 8 (GLOVE) IMPLANT
GLOVE BIOGEL PI INDICATOR 7.0 (GLOVE) ×5
GLOVE BIOGEL PI INDICATOR 8 (GLOVE) ×1
GOWN STRL REUS W/TWL XL LVL3 (GOWN DISPOSABLE) ×2 IMPLANT
IRRIG SUCT STRYKERFLOW 2 WTIP (MISCELLANEOUS) ×2
IRRIGATION SUCT STRKRFLW 2 WTP (MISCELLANEOUS) ×1 IMPLANT
LEGGING LITHOTOMY PAIR STRL (DRAPES) ×2 IMPLANT
OBTURATOR OPTICAL STANDARD 8MM (TROCAR) ×2
OBTURATOR OPTICAL STND 8 DVNC (TROCAR) ×1
OBTURATOR OPTICALSTD 8 DVNC (TROCAR) ×1 IMPLANT
OCCLUDER COLPOPNEUMO (BALLOONS) ×2 IMPLANT
PACK ROBOT WH (CUSTOM PROCEDURE TRAY) ×2 IMPLANT
PACK ROBOTIC GOWN (GOWN DISPOSABLE) ×2 IMPLANT
PACK TRENDGUARD 450 HYBRID PRO (MISCELLANEOUS) IMPLANT
PAD PREP 24X48 CUFFED NSTRL (MISCELLANEOUS) ×2 IMPLANT
PROTECTOR NERVE ULNAR (MISCELLANEOUS) ×4 IMPLANT
SEAL CANN UNIV 5-8 DVNC XI (MISCELLANEOUS) ×4 IMPLANT
SEAL XI 5MM-8MM UNIVERSAL (MISCELLANEOUS) ×8
SET IRRIG Y TYPE TUR BLADDER L (SET/KITS/TRAYS/PACK) IMPLANT
SET TRI-LUMEN FLTR TB AIRSEAL (TUBING) ×2 IMPLANT
SUT VIC AB 4-0 PS2 27 (SUTURE) ×6 IMPLANT
SUT VICRYL 0 UR6 27IN ABS (SUTURE) ×2 IMPLANT
SUT VLOC 180 0 9IN  GS21 (SUTURE) ×2
SUT VLOC 180 0 9IN GS21 (SUTURE) ×1 IMPLANT
TIP UTERINE 6.7X8CM BLUE DISP (MISCELLANEOUS) ×2 IMPLANT
TOWEL OR 17X26 10 PK STRL BLUE (TOWEL DISPOSABLE) ×2 IMPLANT
TRENDGUARD 450 HYBRID PRO PACK (MISCELLANEOUS) ×2
TROCAR PORT AIRSEAL 5X120 (TROCAR) ×2 IMPLANT
WATER STERILE IRR 1000ML POUR (IV SOLUTION) ×2 IMPLANT

## 2020-07-05 NOTE — Transfer of Care (Addendum)
Immediate Anesthesia Transfer of Care Note  Patient: Valerie Welch  Procedure(s) Performed: Procedure(s) (LRB): XI ROBOTIC ASSISTED TOTAL LAPAROSCOPIC HYSTERECTOMY AND SALPINGECTOMY, omental adhesion and bladder and uteralvesicle adhesions, IUD REMOVAL (N/A)  Patient Location: PACU  Anesthesia Type: General  Level of Consciousness: awake, sedated, patient cooperative and responds to stimulation  Airway & Oxygen Therapy: Patient Spontanous Breathing and Patient connected to [place] 02   Post-op Assessment: Report given to PACU RN, Post -op Vital signs reviewed and stable and Patient moving all extremities  Post vital signs: Reviewed and stable  Complications: No apparent anesthesia complications

## 2020-07-05 NOTE — Anesthesia Postprocedure Evaluation (Signed)
Anesthesia Post Note  Patient: Valerie Welch  Procedure(s) Performed: XI ROBOTIC ASSISTED TOTAL LAPAROSCOPIC HYSTERECTOMY AND SALPINGECTOMY, omental adhesion and bladder and uteralvesicle adhesions, IUD REMOVAL (N/A )     Patient location during evaluation: PACU Anesthesia Type: General Level of consciousness: awake and alert Pain management: pain level controlled Vital Signs Assessment: post-procedure vital signs reviewed and stable Respiratory status: spontaneous breathing, nonlabored ventilation, respiratory function stable and patient connected to nasal cannula oxygen Cardiovascular status: blood pressure returned to baseline and stable Postop Assessment: no apparent nausea or vomiting Anesthetic complications: no   No complications documented.  Last Vitals:  Vitals:   07/05/20 1443 07/05/20 1600  BP: (!) 138/95 (!) 146/85  Pulse: 85 76  Resp: 16 16  Temp:  36.5 C  SpO2: 97% 98%    Last Pain:  Vitals:   07/05/20 1600  TempSrc:   PainSc: 4                  [place]

## 2020-07-05 NOTE — H&P (Addendum)
Valerie Welch is an 49 y.o. female.  [id number]    RP: XI Robotic TLH/Bilateral Salpingectomy  HPI: Currently bleeding mildly.  Persistent refractory menometrorrhagia.  Just stopped Megace because of worsening HTN on it.  CRPS type 2 of Rt lower limb, seen by Neurologic for Periop pain control. Asthma/Sleep Apnea under sedation for Colonoscopy, will keep overnight for longer observation postop.    Pertinent Gynecological History: Blood transfusions: none Sexually transmitted diseases: no past history  Last mammogram: normal  Last pap: normal    Menstrual History: No LMP recorded. (Menstrual status: IUD).    Past Medical History:  Diagnosis Date  . Anemia   . Arthritis   . Asthma   . Barrett's esophagus   . Bursitis    hip, knees, shoulders  . Depression   . GERD (gastroesophageal reflux disease)   . History of hiatal hernia   . Hypothyroidism   . Nontoxic uninodular goiter     Past Surgical History:  Procedure Laterality Date  . ANKLE ARTHROSCOPY Right 09/26/2018   Procedure: ANKLE ARTHROSCOPY/OCD REPAIR  &  DEBRIDEMENT; EXTENSIVE;  Surgeon: [name], DPM;  Location: [facility];  Service: Podiatry;  Laterality: Right;  . ARTHRODESIS METATARSALPHALANGEAL JOINT (MTPJ) Right 09/26/2018   Procedure: ARTHRODESIS METATARSALPHALANGEAL JOINT (MTPJ) FUSION;  Surgeon: [name], DPM;  Location: [facility];  Service: Podiatry;  Laterality: Right;  . CESAREAN SECTION     x2  . CHOLECYSTECTOMY    . CHOLECYSTECTOMY, LAPAROSCOPIC    . COLONOSCOPY WITH PROPOFOL N/A 06/16/2020   Procedure: COLONOSCOPY WITH PROPOFOL;  Surgeon: Toledo, Benay Pike, MD;  Location: [facility];  Service: Gastroenterology;  Laterality: N/A;  . ESOPHAGOGASTRODUODENOSCOPY (EGD) WITH PROPOFOL N/A 06/16/2020   Procedure: ESOPHAGOGASTRODUODENOSCOPY (EGD) WITH PROPOFOL;  Surgeon: Toledo, Benay Pike, MD;  Location: [facility];  Service: Gastroenterology;  Laterality: N/A;  . EXOSTECTECTOMY TOE Right  09/26/2018   Procedure: EXOSTECTECTOMY TOE-2ND MTPJ;  Surgeon: [name], DPM;  Location: [facility];  Service: Podiatry;  Laterality: Right;  . TENOTOMY / FLEXOR TENDON TRANSFER Right 09/26/2018   Procedure: FLEXOR TENDON TRANSFER; DEEP;  Surgeon: [name], DPM;  Location: [facility];  Service: Podiatry;  Laterality: Right;    Family History  Problem Relation Age of Onset  . Diabetes Mother   . Stroke Mother   . Heart disease Mother   . Stroke Father   . Stroke Brother   . Hypertension Maternal Grandfather   . Cancer Paternal Grandfather        lung    Social History:  reports that she has never smoked. She has never used smokeless tobacco. She reports current alcohol use of about 3.0 standard drinks of alcohol per week. She reports that she does not use drugs.  Allergies:  Allergies  Allergen Reactions  . Beef-Derived Products Swelling  . Other     Potato starch - lots of flem  . Percocet [Oxycodone-Acetaminophen] Nausea And Vomiting  . Pitocin [Oxytocin]     halucinations     Medications Prior to Admission  Medication Sig Dispense Refill Last Dose  . ALPRAZolam (XANAX) 0.25 MG tablet Take 0.25 mg by mouth at bedtime as needed for anxiety or sleep.      . Ascorbic Acid (VITAMIN C) 1000 MG tablet Take 1,000 mg by mouth daily.     Marland Kitchen azelastine (ASTELIN) 0.1 % nasal spray Place 1 spray into both nostrils daily as needed for allergies.      . Bepotastine Besilate (BEPREVE) 1.5 % SOLN Place  1 drop into both eyes in the morning and at bedtime.      Marland Kitchen buPROPion (WELLBUTRIN XL) 150 MG 24 hr tablet Take 150 mg by mouth daily.     . cetirizine (ZYRTEC) 10 MG tablet Take 10 mg by mouth daily.     . Cyanocobalamin (B-12) 1000 MCG/ML KIT Inject 1,000 mcg as directed every 30 (thirty) days.      . diclofenac sodium (VOLTAREN) 1 % GEL Apply 1 application topically daily as needed (pain).      . DULoxetine (CYMBALTA) 60 MG capsule Take 1 capsule (60 mg total) by mouth daily. 30  capsule 5   . Fluticasone-Salmeterol (ADVAIR) 250-50 MCG/DOSE AEPB Inhale 1 puff into the lungs daily as needed (shortness of breath).      . gabapentin (NEURONTIN) 300 MG capsule 300 mg in the morning, 300 mg in the afternoon, 600 mg nightly 120 capsule 5   . ibuprofen (ADVIL,MOTRIN) 800 MG tablet Take 800 mg by mouth daily as needed for moderate pain.     . iron polysaccharides (NIFEREX) 150 MG capsule Take 150 mg by mouth at bedtime.     Marland Kitchen levonorgestrel (MIRENA) 20 MCG/24HR IUD 1 each by Intrauterine route once.     Marland Kitchen levothyroxine (SYNTHROID, LEVOTHROID) 25 MCG tablet Take 25 mcg by mouth daily before breakfast.     . Melatonin 5 MG CAPS Take 5 mg by mouth at bedtime as needed (sleep).     . montelukast (SINGULAIR) 10 MG tablet Take 10 mg by mouth at bedtime.     . Multiple Vitamin (MULTIVITAMIN WITH MINERALS) TABS tablet Take 1 tablet by mouth daily at 12 noon.     Marland Kitchen omeprazole (PRILOSEC) 20 MG capsule Take 20 mg by mouth 2 (two) times daily before a meal.      . polyethylene glycol (MIRALAX / GLYCOLAX) 17 g packet Take 17 g by mouth daily.     . rosuvastatin (CRESTOR) 5 MG tablet Take 5 mg by mouth at bedtime.     . trolamine salicylate (ASPERCREME) 10 % cream Apply 1 application topically as needed for muscle pain.       REVIEW OF SYSTEMS: A ROS was performed and pertinent positives and negatives are included in the history.  GENERAL: No fevers or chills. HEENT: No change in vision, no earache, sore throat or sinus congestion. NECK: No pain or stiffness. CARDIOVASCULAR: No chest pain or pressure. No palpitations. PULMONARY: No shortness of breath, cough or wheeze. GASTROINTESTINAL: No abdominal pain, nausea, vomiting or diarrhea, melena or bright red blood per rectum. GENITOURINARY: No urinary frequency, urgency, hesitancy or dysuria. MUSCULOSKELETAL: No joint or muscle pain, no back pain, no recent trauma. DERMATOLOGIC: No rash, no itching, no lesions. ENDOCRINE: No polyuria, polydipsia, no  heat or cold intolerance. No recent change in weight. HEMATOLOGICAL: No anemia or easy bruising or bleeding. NEUROLOGIC: No headache, seizures, numbness, tingling or weakness. PSYCHIATRIC: No depression, no loss of interest in normal activity or change in sleep pattern.    Physical exam:   06/16/2020  1035 06/16/2020  1045 06/16/2020  1055 06/16/2020  1105 06/24/2020  1309 06/24/2020  1339 07/01/2020  1328 07/05/2020  0646  MEWS Score: 1 0 0 1 0 0 0 0  MEWS Score Color: _0  Green Green Green  MEWS Temp: 0 0 0 0 0 0 0 0  MEWS Systolic: 0 0 0 0 0 0 0 0  MEWS Pulse: 0 0 0 0 0 0  0 0  MEWS RR: 0 0 0 1 0 0 0 0  MEWS LOC: 1 0 0 0 0 0 0 0  Temp: 97.6 F (36.4 C) -- -- -- 99.2 F (37.3 C) -- 98.2 F (36.8 C) 99.8 F (37.7 C)  BP: 138/95Abnormal 130/92Abnormal 148/98Abnormal 150/91Abnormal 144/108Abnormal 155/105Abnormal 156/101Abnormal 141/64Abnormal  Pulse: 92 82 85 76 75 79 84 90  Resp: _0 -- -- 18  Level of Consciousness: Responds to Voice Alert Alert Alert -- -- -- --  O2 Device: Simple Mask Simple Mask Room Air Room Air Room Air -- -- Room Air  O2 Flow Rate (L/min): 8 L/min 8 L/min -- -- -- -- -- --    See office notes   Results for orders placed or performed during the hospital encounter of 07/05/20 (from the past 24 hour(s))  Pregnancy, urine POC     Status: None   Collection Time: 07/05/20  6:40 AM  Result Value Ref Range   Preg Test, Ur NEGATIVE NEGATIVE   Pelvic US 04/2020: T/V images. Anteverted uterus enlarged with central fibroid measured at 4.3 x 3.2 x 2.4 cm. The overall uterine size is measured at 8.34 x 5.74 x 4.83 cm. The endometrial cavity is deviated to the left side and distorted by the central fibroid. The IUD is noted within the intrauterine cavity. No obvious endometrial mass but cavity is difficult to evaluate due to distortion. Both ovaries are normal in size with sparse follicles. No adnexal mass. No free  fluid in the posterior cul-de-sac.   Assessment/Plan:  49 y.o. [id number]   1. [name] Refractory persistent [name].  Worsening HTN on Megace, patient just stopped it.  Decision to proceed with  XI Robotic TLH/Bilateral Salpingectomy.  Preop preparation, surgical procedure with risks and benefits as well as postop precautions and expectations thoroughly reviewed with patient.  Patient voiced understanding and agreement.  We will keep patient overnight to observe longer given the history of asthma and apnea under sedation.  2. Fibroid Central IM/SM Fibroid 4.3 cm.  3. Complex regional pain syndrome type II of right lower limb Seen by Neurologist.  Will plan local anesthesia at incision sites, as usual. Patient prefers Vicodin for pain control at home.                        Patient was counseled as to the risk of surgery to include the following:  1. Infection (prohylactic antibiotics will be administered)  2. DVT/Pulmonary Embolism (prophylactic pneumo compression stockings will be used)  3.Trauma to internal organs requiring additional surgical procedure to repair any injury to internal organs requiring perhaps additional hospitalization days.  4.Hemmorhage requiring transfusion and blood products which carry risks such as anaphylactic reaction, hepatitis and AIDS  Patient had received literature information on the procedure scheduled and all her questions were answered and fully accepts all risk.  Marie-Lyne Jerney Baksh 07/05/2020, 6:42 AM

## 2020-07-05 NOTE — Progress Notes (Signed)
07/05/2020 1840 On call MD paged regarding pt. Concerns for home medications and pain management plan post op. Call returned by Dr. Delilah Shan. Verbal orders received ok for patient to resume home medications as well as ok to increase PO Dilaudid to 2 mg Q3 H PRN for severe pain. Verbal orders also received for one time dose of Miralax. Orders placed and enacted. Will continue to closely monitor patient.  Kati Riggenbach, Arville Lime

## 2020-07-05 NOTE — Anesthesia Procedure Notes (Signed)
Procedure Name: Intubation Date/Time: 07/05/2020 8:37 AM Performed by: Justice Rocher, CRNA Pre-anesthesia Checklist: Patient identified, Emergency Drugs available, Suction available, Patient being monitored and Timeout performed Patient Re-evaluated:Patient Re-evaluated prior to induction Oxygen Delivery Method: Circle system utilized Preoxygenation: Pre-oxygenation with 100% oxygen Induction Type: IV induction Ventilation: Mask ventilation without difficulty Laryngoscope Size: Mac and 4 Grade View: Grade II Tube type: Oral Tube size: 7.5 mm Number of attempts: 1 Airway Equipment and Method: Stylet and Oral airway Placement Confirmation: ETT inserted through vocal cords under direct vision,  positive ETCO2 and breath sounds checked- equal and bilateral Secured at: 22 cm Tube secured with: Tape Dental Injury: Teeth and Oropharynx as per pre-operative assessment

## 2020-07-05 NOTE — Op Note (Addendum)
Operative Note  07/05/2020  11:43 AM  PATIENT:  Valerie Welch  49 y.o. female  PRE-OPERATIVE DIAGNOSIS:  [name], intramural/submucous fibroid, pelvic pain  POST-OPERATIVE DIAGNOSIS:  [name], intramural/submucous fibroid, pelvic pain, omental adhesions, utero-vesical adhesions  PROCEDURE:  Procedure(s): XI ROBOTIC ASSISTED TOTAL LAPAROSCOPIC HYSTERECTOMY AND SALPINGECTOMY, Lysis of omental adhesions and utero-vesicle adhesions  SURGEON:  Surgeon(s): Princess Bruins, MD Joseph Pierini, MD  Primary Assistant needed throughout the case for retraction, irrigation and suction to improve visualization and maintain good hemostasis.  Surgery with a higher degree of difficulty because of severe adhesions.    ANESTHESIA:  general  FINDINGS: Uterus with fibroids, bilateral tubes normal.  Bilateral ovaries normal.  Omental adhesions with anterior abdominal wall.  Utero-vesical adhesions.  DESCRIPTION OF OPERATION: Under general anesthesia with endotracheal intubation the patient is in lithotomy position.  She is prepped with DuraPrep on the abdomen and with Betadine on the suprapubic, vulvar and vaginal areas.  She is draped as usual.  Timeout is done.  The Foley is inserted in the bladder.  The vaginal exam reveals an anteverted uterus, normal volume, mobile.  No adnexal mass.  The weighted speculum is inserted in the vagina and the anterior lip of the cervix is grasped with a tenaculum.  The intrauterine device is removed easily, it is complete and intact.  The cervix is dilated with Kennon Rounds dilators.  The hysterometry is at 8 cm.  A #8 Rumi with the medium Koh ring are put in place easily.  The other instruments are removed.  We go to the abdomen.  The supraumbilical area is infiltrated with Marcaine one quarter plain.  A 1.5 cm incision is done with the scalpel at that level.  The aponeurosis is grasped with cokers and opened with Mayo scissors under direct vision.  The  parietal peritoneum is opened bluntly with the finger.  Omental adhesions are present at that level and they are freed with the finger.  A pursestring stitch of Vicryl 0 is done on the aponeurosis.  The Sheryle Hail is inserted at that level under direct vision and up pneumoperitoneum is created with CO2.  The camera is inserted at that level.  Inspection reveals that the anterior wall of the abdomen is free where the ports will be entered.  Omental adhesions with the anterior wall are present below the umbilicus on the midline.  The skin is marked, infiltrated with Marcaine quarter plain and small incisions are made with the scalpel at the port sites.  Two robotic ports are inserted on the right side under direct vision in line with the umbilicus.  1 robotic port is inserted on the left side in line with the umbilicus distally and the assistant port an 8 mm port is inserted medially in line with the umbilicus under direct vision.  The patient is positioned in 32 degree Trendelenburg.  The robot is docked.  Targeting is done.  The robotic instruments are inserted under direct vision with the PK in the first arm, the camera and the second arm, the scissors in the third arm and the fenestrated clamp in the fourth arm.  We go to the robot console.      We complete the lysis of omental adhesions with the anterior wall of the abdomen.  Both ureters are normal anatomic position with good peristalsis.  The uterus has small fibroids, both tubes are normal and both ovaries.  We start on the right side.  We cauterized and sectioned the right mesosalpinx.  We  cauterized and sectioned the right utero-ovarian ligament.  We cauterized and sectioned the right round ligament.  We descend along the right broad ligament and stopped just before the uterine artery.  We open the visceral peritoneum anteriorly and start the lysis of adhesions between the bladder and the lower uterine segment of the uterus.  We proceeded exactly the same way  on the left side.  We further descend the bladder anteriorly past the [facility] ring.  We cauterized the right and left uterine arteries and sectioned.  The vaginal occluder is inflated.  We opened the vagina over the [facility] ring with the tip of the scissors anteriorly, posteriorly and on each side to completely detach the uterus with the tubes and cervix.  The specimen is passed vaginally and sent to pathology.  The occluder is put back in place in the vagina.  We irrigate and suction the pelvic cavity to confirm good hemostasis at all levels.  We switched the instruments to the cutting needle driver in the third arm and the long tip in the first arm.  We connect the fenestrated clamp to current.  We used a V-Loc 0 at 9 inches to close the vaginal vault.  We started the right angle and closed the vagina with a running suture to the left angle and then back to the midline.  The needle is removed from the abdominal cavity.  Hemostasis is adequate at all levels.  Peristalsis is visualized at the bilateral ureters.  Urine is clear.  The robotic instruments are removed.  The robot is undocked.  We go to laparoscopy.      Hemostasis is confirmed once more.  The ports are removed under direct vision.  The CO2 is evacuated.  The pursestring stitch is attached at the aponeurosis at the supraumbilical incision.  We closed each incision with a subcuticular stitch of Vicryl 4-0.  Dermabond is added on all incisions.  The vaginal occluder is removed.  The patient is brought to recovery room in good and stable status.  ESTIMATED BLOOD LOSS: 30 mL   Intake/Output Summary (Last 24 hours) at 07/05/2020 1143 Last data filed at 07/05/2020 1141 Gross per 24 hour  Intake 1750 ml  Output 30 ml  Net 1720 ml     BLOOD ADMINISTERED:none   LOCAL MEDICATIONS USED:  MARCAINE     SPECIMEN:  Source of Specimen:  Uterus with cervix and bilateral tubes  DISPOSITION OF SPECIMEN:  PATHOLOGY  COUNTS:  YES  PLAN OF CARE: Transfer to  PACU  Marie-Lyne LavoieMD11:43 AM

## 2020-07-06 ENCOUNTER — Encounter (HOSPITAL_BASED_OUTPATIENT_CLINIC_OR_DEPARTMENT_OTHER): Payer: Self-pay | Admitting: Obstetrics & Gynecology

## 2020-07-06 DIAGNOSIS — D250 Submucous leiomyoma of uterus: Secondary | ICD-10-CM | POA: Diagnosis not present

## 2020-07-06 LAB — SURGICAL PATHOLOGY

## 2020-07-06 MED ORDER — ACETAMINOPHEN 500 MG PO TABS
ORAL_TABLET | ORAL | Status: AC
  Filled 2020-07-06: qty 2

## 2020-07-06 MED ORDER — HYDROCODONE-ACETAMINOPHEN 5-325 MG PO TABS: 1.0000 | ORAL_TABLET | Freq: Four times a day (QID) | ORAL | 0 refills | Status: DC | PRN

## 2020-07-06 MED ORDER — HYDROMORPHONE HCL 2 MG PO TABS
ORAL_TABLET | ORAL | Status: AC
  Filled 2020-07-06: qty 1

## 2020-07-06 MED ORDER — KETOROLAC TROMETHAMINE 30 MG/ML IJ SOLN
30.0000 mg | Freq: Once | INTRAMUSCULAR | Status: AC
  Administered 2020-07-06: 30 mg via INTRAVENOUS

## 2020-07-06 MED ORDER — KETOROLAC TROMETHAMINE 30 MG/ML IJ SOLN
INTRAMUSCULAR | Status: AC
  Filled 2020-07-06: qty 1

## 2020-07-06 NOTE — Discharge Instructions (Signed)

## 2020-07-06 NOTE — Progress Notes (Signed)
XI Robotic TLH/Bilateral Salpingectomy  Subjective: Patient reports tolerating PO and no problems voiding.    Objective: I have reviewed patient's vital signs.  vital signs, intake and output, medications and labs.  Vitals:   07/06/20 0400 07/06/20 0847  BP: (!) 145/96 (!) 155/105  Pulse: 93 88  Resp: 20 20  Temp: 98.3 F (36.8 C) 99 F (37.2 C)  SpO2: 98% 97%   I/O last 3 completed shifts: In: 3005 [P.O.:680; I.V.:2075; IV Piggyback:250] Out: 2080 [Urine:2050; Blood:30] Total I/O In: 200 [P.O.:200] Out: 400 [Urine:400]  Results for orders placed or performed during the hospital encounter of 07/05/20 (from the past 24 hour(s))  CBC     Status: None   Collection Time: 07/05/20  2:41 PM  Result Value Ref Range   WBC 8.4 4.0 - 10.5 K/uL   RBC 4.08 3.87 - 5.11 MIL/uL   Hemoglobin 12.8 12.0 - 15.0 g/dL   HCT 39.6 36 - 46 %   MCV 97.1 80.0 - 100.0 fL   MCH 31.4 26.0 - 34.0 pg   MCHC 32.3 30.0 - 36.0 g/dL   RDW 13.1 11.5 - 15.5 %   Platelets 271 150 - 400 K/uL   nRBC 0.0 0.0 - 0.2 %    EXAM General: alert and cooperative Resp: clear to auscultation bilaterally Cardio: regular rate and rhythm GI: soft, non-tender; bowel sounds normal; no masses,  no organomegaly and incision: clean, dry and intact Extremities: no edema, redness or tenderness in the calves or thighs Vaginal Bleeding: none  Assessment: s/p Procedure(s): XI ROBOTIC ASSISTED TOTAL LAPAROSCOPIC HYSTERECTOMY AND SALPINGECTOMY, omental adhesion and bladder and uteralvesicle adhesions, IUD REMOVAL: stable, progressing well and tolerating diet  Plan: Advance diet Encourage ambulation Discharge home  LOS: 0 days    Princess Bruins, MD 07/06/2020 9:45 AM

## 2020-07-13 ENCOUNTER — Telehealth: Payer: Self-pay

## 2020-07-13 NOTE — Telephone Encounter (Signed)
I called and spoke with patient and let her know that I received forms/request for medical records from her disability co.  I advised her I will need a signed medical records release form from her and the form fee paid prior to sending out this information to them.  I walked her through getting to form online on [employer] website. She was not prepared to make fee payment today but understands I can not send the forms out until that is paid. She will call back to make payment.  We discussed that she would like to start with 6 weeks on her disability form and if Dr. Nyoka Cowden her to go back sooner she will do that. She will let me know what form I will need to do if that happens.

## 2020-07-19 ENCOUNTER — Encounter: Payer: Self-pay | Admitting: Anesthesiology

## 2020-07-20 DIAGNOSIS — Z0289 Encounter for other administrative examinations: Secondary | ICD-10-CM

## 2020-07-27 ENCOUNTER — Encounter: Payer: Self-pay | Admitting: Obstetrics & Gynecology

## 2020-07-27 ENCOUNTER — Ambulatory Visit (INDEPENDENT_AMBULATORY_CARE_PROVIDER_SITE_OTHER): Payer: BC Managed Care – PPO | Admitting: Obstetrics & Gynecology

## 2020-07-27 ENCOUNTER — Other Ambulatory Visit: Payer: Self-pay

## 2020-07-27 VITALS — BP 124/84 | Temp 98.6°F

## 2020-07-27 DIAGNOSIS — R1031 Right lower quadrant pain: Secondary | ICD-10-CM | POA: Diagnosis not present

## 2020-07-27 DIAGNOSIS — Z09 Encounter for follow-up examination after completed treatment for conditions other than malignant neoplasm: Secondary | ICD-10-CM

## 2020-07-27 DIAGNOSIS — R197 Diarrhea, unspecified: Secondary | ICD-10-CM

## 2020-07-27 LAB — CBC WITH DIFFERENTIAL/PLATELET
Absolute Monocytes: 510 cells/uL (ref 200–950)
Basophils Absolute: 63 cells/uL (ref 0–200)
Basophils Relative: 1 %
Eosinophils Absolute: 239 cells/uL (ref 15–500)
Eosinophils Relative: 3.8 %
HCT: 42.9 % (ref 35.0–45.0)
Hemoglobin: 14 g/dL (ref 11.7–15.5)
Lymphs Abs: 1751 cells/uL (ref 850–3900)
MCH: 31.2 pg (ref 27.0–33.0)
MCHC: 32.6 g/dL (ref 32.0–36.0)
MCV: 95.5 fL (ref 80.0–100.0)
MPV: 10.7 fL (ref 7.5–12.5)
Monocytes Relative: 8.1 %
Neutro Abs: 3736 cells/uL (ref 1500–7800)
Neutrophils Relative %: 59.3 %
Platelets: 334 10*3/uL (ref 140–400)
RBC: 4.49 10*6/uL (ref 3.80–5.10)
RDW: 12.4 % (ref 11.0–15.0)
Total Lymphocyte: 27.8 %
WBC: 6.3 10*3/uL (ref 3.8–10.8)

## 2020-07-27 MED ORDER — METRONIDAZOLE 500 MG PO TABS
500.0000 mg | ORAL_TABLET | Freq: Two times a day (BID) | ORAL | 0 refills | Status: AC
Start: 2020-07-27 — End: 2020-08-03

## 2020-07-27 NOTE — Progress Notes (Signed)
° ° °  Valerie Welch 26-Apr-1971 [id number]        49 y.o.  [id number]   RP: Post op XI Robotic TLH/Bilateral Salpingectomy 07/05/2020  HPI:  Had pain at the Rt lower abdomen and Rt abdominal wall after surgery.  That pain is slowly improving.  Eating a lot of fibers and beans.  Having loose dark stools/diarrhea every day.  Passing gas.  No fever.  Urine normal.  No vaginal bleeding.  No vaginal discharge.   OB History  Gravida Para Term Preterm AB Living  5 2     2 2   SAB TAB Ectopic Multiple Live Births  1            # Outcome Date GA Lbr Len/2nd Weight Sex Delivery Anes PTL Lv  5 Gravida           4 AB           3 SAB           2 Para           1 Para             Past medical history,surgical history, problem list, medications, allergies, family history and social history were all reviewed and documented in the EPIC chart.   Directed ROS with pertinent positives and negatives documented in the history of present illness/assessment and plan.  Exam:  Vitals:   07/27/20 1110  BP: 124/84   General appearance:  Normal  Abdomen: Incisions well closed, healing well.  Soft, mildly tender Rt > Lt.  BS present, no high pitch.  Gynecologic exam: Vulva normal.  Speculum:  Vaginal vault well closed.  No blood.  Normal secretions.   Assessment/Plan:  49 y.o. [id number]   1. Status post gynecological surgery, follow-up exam Severe right lower abdominal pain with right abdominal wall pain immediately after surgery, which is now slowly improving.  Passing gas and not vomiting.  Having dark diarrhea every day.  No fever.  Recommendation to decrease her intake of fibers and beans and eat more cheese and bananas.  We will check her white blood cell count with a differential today.  Start on Flagyl 500 mg twice a day for 7 days.  F/U reassessment in 1 week.  2. Right lower quadrant abdominal pain As above. - CBC w/Diff  3. Diarrhea, unspecified type Possible bacterial diarrhea.  Will  treat with metronidazole 500 mg twice a day for 7-days.   - CBC w/Diff  Other orders - metroNIDAZOLE (FLAGYL) 500 MG tablet; Take 1 tablet (500 mg total) by mouth 2 (two) times daily for 7 days.  Princess Bruins MD, 11:20 AM 07/27/2020

## 2020-07-28 ENCOUNTER — Encounter: Payer: Self-pay | Admitting: *Deleted

## 2020-08-02 ENCOUNTER — Encounter: Payer: Self-pay | Admitting: Obstetrics & Gynecology

## 2020-08-02 ENCOUNTER — Other Ambulatory Visit: Payer: Self-pay

## 2020-08-02 ENCOUNTER — Ambulatory Visit (INDEPENDENT_AMBULATORY_CARE_PROVIDER_SITE_OTHER): Payer: BC Managed Care – PPO | Admitting: Obstetrics & Gynecology

## 2020-08-02 VITALS — BP 122/84

## 2020-08-02 DIAGNOSIS — Z09 Encounter for follow-up examination after completed treatment for conditions other than malignant neoplasm: Secondary | ICD-10-CM

## 2020-08-02 DIAGNOSIS — R197 Diarrhea, unspecified: Secondary | ICD-10-CM

## 2020-08-02 DIAGNOSIS — R1031 Right lower quadrant pain: Secondary | ICD-10-CM

## 2020-08-02 NOTE — Progress Notes (Signed)
    Valerie Welch Jun 25, 1971 [id number]        49 y.o.  [id number]   RP: Postop XI Robotic TLH/Bilateral Salpingectomy on 07/05/2020  HPI: Patient had persistent right abdominal pain with diarrhea for which she was started on metronidazole last week.  Since then the abdominal pain has subsided almost entirely and her stools are forming and normal in color.  No vaginal bleeding or discharge.  Urine normal.  No fever.   OB History  Gravida Para Term Preterm AB Living  5 2     2 2   SAB TAB Ectopic Multiple Live Births  1            # Outcome Date GA Lbr Len/2nd Weight Sex Delivery Anes PTL Lv  5 Gravida           4 AB           3 SAB           2 Para           1 Para             Past medical history,surgical history, problem list, medications, allergies, family history and social history were all reviewed and documented in the EPIC chart.   Directed ROS with pertinent positives and negatives documented in the history of present illness/assessment and plan.  Exam:  Vitals:   08/02/20 1423  BP: 122/84   General appearance:  Normal  Abdomen: Abdomen is soft and nontender, not distended.  Incisions healing well.  Gynecologic exam: Vulva normal.  Speculum: Vaginal vault well closed.  No blood.  Normal secretions.   Assessment/Plan:  49 y.o. [id number]   1. Status post gynecological surgery, follow-up exam Good postop evolution with resolution of right abdominal pain and diarrhea after 7 days of metronidazole.  No postop complication otherwise.  Vaginal cuff well closed.  Will follow up in 3 to 4 weeks to reassess the cuff before resuming sexual activities.  2. Right lower quadrant abdominal pain Improving.  Normal abdominal exam.  3. Diarrhea, unspecified type Resolving slowly after 7 days of metronidazole.  Patient will continue to pay attention to her nutrition to avoid loose stools.  Princess Bruins MD, 2:43 PM 08/02/2020

## 2020-08-06 ENCOUNTER — Encounter: Payer: Self-pay | Admitting: Obstetrics & Gynecology

## 2020-08-15 ENCOUNTER — Ambulatory Visit (INDEPENDENT_AMBULATORY_CARE_PROVIDER_SITE_OTHER): Payer: BC Managed Care – PPO | Admitting: Obstetrics & Gynecology

## 2020-08-15 ENCOUNTER — Other Ambulatory Visit: Payer: Self-pay

## 2020-08-15 ENCOUNTER — Encounter: Payer: Self-pay | Admitting: Obstetrics & Gynecology

## 2020-08-15 VITALS — BP 124/80

## 2020-08-15 DIAGNOSIS — Z09 Encounter for follow-up examination after completed treatment for conditions other than malignant neoplasm: Secondary | ICD-10-CM

## 2020-08-15 DIAGNOSIS — R109 Unspecified abdominal pain: Secondary | ICD-10-CM

## 2020-08-15 NOTE — Telephone Encounter (Signed)
Patient called asking me to fax her office note to [facility] as Dr. Marguerita Merles has extended her disability until 08/29/20.

## 2020-08-15 NOTE — Progress Notes (Addendum)
    Valerie Welch 1971-08-11 [id number]        49 y.o.  [id number]   RP: Post-op XI Robotic TLH/Bilateral Salpingectomy  HPI: Improving Rt abdominal wall pain.  No pelvic pain.  No vaginal bleeding.  No vaginal d/c.  Urine normal.  BMs improved, no longer having diarrhea.  No fever.   OB History  Gravida Para Term Preterm AB Living  5 2     2 2   SAB TAB Ectopic Multiple Live Births  1            # Outcome Date GA Lbr Len/2nd Weight Sex Delivery Anes PTL Lv  5 Gravida           4 AB           3 SAB           2 Para           1 Para             Past medical history,surgical history, problem list, medications, allergies, family history and social history were all reviewed and documented in the EPIC chart.   Directed ROS with pertinent positives and negatives documented in the history of present illness/assessment and plan.  Exam:  Vitals:   08/15/20 1512  BP: 124/80   General appearance:  Normal  Abdomen: Incisions healing well.  BS present.  Abdomen soft with no distention.  Tender to palpation at the Rt mid abdomen.  Gynecologic exam: Vulva normal.  Bimanual exam:  Vaginal vault well healed.  No induration.  No pelvic mass felt.   Assessment/Plan:  49 y.o. [id number]   1. Status post gynecological surgery, follow-up exam Improving right mid abdominal wall tenderness.  Otherwise exam completely normal.  Incisions well-healed.  Vaginal vault well-healed and no pelvic mass felt.  Decision to go back to work August 29, 2020.  We will follow-up in 2 weeks just prior to resume work for reevaluation.  2. Right lateral abdominal pain Slowly improving right mid abdominal wall tenderness.  Probably secondary to a large ecchymosis/hematoma at that level which is slowly resolving.  Princess Bruins MD, 3:24 PM 08/15/2020

## 2020-08-16 ENCOUNTER — Encounter: Payer: Self-pay | Admitting: Obstetrics & Gynecology

## 2020-08-17 NOTE — Telephone Encounter (Signed)
Records faxed as requested. Med Rec Release form is already on file.

## 2020-08-25 ENCOUNTER — Encounter: Payer: Self-pay | Admitting: Obstetrics & Gynecology

## 2020-08-25 ENCOUNTER — Other Ambulatory Visit: Payer: Self-pay

## 2020-08-25 ENCOUNTER — Ambulatory Visit (INDEPENDENT_AMBULATORY_CARE_PROVIDER_SITE_OTHER): Payer: BC Managed Care – PPO | Admitting: Obstetrics & Gynecology

## 2020-08-25 VITALS — BP 136/88

## 2020-08-25 DIAGNOSIS — Z09 Encounter for follow-up examination after completed treatment for conditions other than malignant neoplasm: Secondary | ICD-10-CM

## 2020-08-25 NOTE — Progress Notes (Signed)
    Valerie Welch 31-Jul-1971 [id number]        49 y.o.  [id number]   RP: Postop XI robotic TLH with bilateral salpingectomy on July 05, 2020.  HPI: Much improved with barely any tenderness on the right abdominal wall.  All incisions well-healed.  No vaginal bleeding or discharge.  No pelvic pain.  Urine and bowel movements back to normal.  Might have had blood in the stools due to excision of a precancerous polyp at the time of colonoscopy.  Will follow with gastro as needed.  No fever.   OB History  Gravida Para Term Preterm AB Living  5 2     2 2   SAB TAB Ectopic Multiple Live Births  1            # Outcome Date GA Lbr Len/2nd Weight Sex Delivery Anes PTL Lv  5 Gravida           4 AB           3 SAB           2 Para           1 Para             Past medical history,surgical history, problem list, medications, allergies, family history and social history were all reviewed and documented in the EPIC chart.   Directed ROS with pertinent positives and negatives documented in the history of present illness/assessment and plan.  Exam:  Vitals:   08/25/20 1226  BP: 136/88   General appearance:  Normal  Abdomen: Soft and nontender, not distended.  Incisions well-healed with no inflammation.  Gynecologic exam: Vulva normal.  Bimanual exam: Vaginal vault well closed with no induration or tenderness.  No pelvic mass or tenderness.  Normal secretions and no bleeding.   Assessment/Plan:  49 y.o. [id number]   1. Status post gynecological surgery, follow-up exam Excellent postop evolution with resolution of the right abdominal wall pain.  Can resume physical activity and be sexually active.  We will follow-up at annual gynecologic exam.   Princess Bruins MD, 12:35 PM 08/25/2020

## 2020-08-28 ENCOUNTER — Encounter: Payer: Self-pay | Admitting: Obstetrics & Gynecology

## 2020-11-24 ENCOUNTER — Encounter: Payer: Self-pay | Admitting: Student in an Organized Health Care Education/Training Program

## 2020-11-24 ENCOUNTER — Other Ambulatory Visit: Payer: Self-pay

## 2020-11-24 ENCOUNTER — Ambulatory Visit
Payer: BC Managed Care – PPO | Attending: Student in an Organized Health Care Education/Training Program | Admitting: Student in an Organized Health Care Education/Training Program

## 2020-11-24 VITALS — BP 150/99 | HR 84 | Temp 97.0°F | Resp 18 | Ht 61.0 in | Wt 184.0 lb

## 2020-11-24 DIAGNOSIS — G894 Chronic pain syndrome: Secondary | ICD-10-CM | POA: Insufficient documentation

## 2020-11-24 DIAGNOSIS — M792 Neuralgia and neuritis, unspecified: Secondary | ICD-10-CM

## 2020-11-24 DIAGNOSIS — G5771 Causalgia of right lower limb: Secondary | ICD-10-CM | POA: Diagnosis present

## 2020-11-24 DIAGNOSIS — Z9889 Other specified postprocedural states: Secondary | ICD-10-CM | POA: Diagnosis present

## 2020-11-24 DIAGNOSIS — G5793 Unspecified mononeuropathy of bilateral lower limbs: Secondary | ICD-10-CM | POA: Diagnosis present

## 2020-11-24 MED ORDER — DULOXETINE HCL 60 MG PO CPEP: 60.0000 mg | ORAL_CAPSULE | Freq: Every day | ORAL | 5 refills | Status: DC

## 2020-11-24 MED ORDER — GABAPENTIN 300 MG PO CAPS
ORAL_CAPSULE | ORAL | 5 refills | Status: DC
Start: 2020-11-24 — End: 2021-06-27

## 2020-11-24 NOTE — Progress Notes (Signed)
PROVIDER NOTE: Information contained herein reflects review and annotations entered in association with encounter. Interpretation of such information and data should be left to medically-trained personnel. Information provided to patient can be located elsewhere in the medical record under "Patient Instructions". Document created using STT-dictation technology, any transcriptional errors that may result from process are unintentional.    Patient: Valerie Welch  Service Category: E/M  Provider: Gillis Santa, MD  DOB: January 21, 1971  DOS: 11/24/2020  Specialty: Interventional Pain Management  MRN: [id number]  Setting: Ambulatory outpatient  PCP: Tracie Harrier, MD  Type: Established Patient    Referring Provider: Tracie Harrier, MD  Location: Office  Delivery: Face-to-face     HPI  [name], a 49 y.o. year old female, is here today because of her Complex regional pain syndrome type II of right lower limb [G57.71]. Ms. Dedic primary complain today is Foot Pain (right) Last encounter: My last encounter with her was on 06/02/2020. Pertinent problems: Ms. Peggs has History of foot surgery; Generalized anxiety disorder; Neuropathic pain of both legs; Complex regional pain syndrome type II of right lower limb; and Neuropathic pain of right foot on their pertinent problem list. Pain Assessment: Severity of Chronic pain is reported as a 1 /10. Location: Foot Right/up the right leg. Onset: More than a month ago. Quality:  (needles, fire). Timing: Constant. Modifying factor(s): cold, cream, elevation, medications. Vitals:  height is '5\' 1"'  (1.549 m) and weight is 184 lb (83.5 kg). Her temporal temperature is 97 F (36.1 C) (abnormal). Her blood pressure is 150/99 (abnormal) and her pulse is 84. Her respiration is 18 and oxygen saturation is 100%.   Reason for encounter: medication management.   No change in medical history since last visit.  Patient's pain is at baseline.   Patient continues multimodal pain regimen as prescribed.  States that it provides pain relief and improvement in functional status. Unfortunately, her previous request for lumbar sympathetic nerve block was denied by the insurance which is odd as that is the diagnostic/therapeutic goal standard for CRPS.  We will try and resubmit after the new year.  We also discussed spinal cord stimulation as a potential therapeutic option for her CRPS.  ROS  Constitutional: Denies any fever or chills Gastrointestinal: No reported hemesis, hematochezia, vomiting, or acute GI distress Musculoskeletal: Denies any acute onset joint swelling, redness, loss of ROM, or weakness Neurological: No reported episodes of acute onset apraxia, aphasia, dysarthria, agnosia, amnesia, paralysis, loss of coordination, or loss of consciousness  Medication Review  ALPRAZolam, B-12, Bepotastine Besilate, DULoxetine, Fluticasone-Salmeterol, HYDROcodone-acetaminophen, Melatonin, azelastine, buPROPion, cetirizine, diclofenac sodium, gabapentin, ibuprofen, iron polysaccharides, levothyroxine, montelukast, multivitamin with minerals, omeprazole, polyethylene glycol, rosuvastatin, trolamine salicylate, and vitamin C  History Review  Allergy: Ms. Feher is allergic to beef-derived products, other, percocet [oxycodone-acetaminophen], and pitocin [oxytocin]. Drug: Ms. Totten  reports no history of drug use. Alcohol:  reports current alcohol use of about 3.0 standard drinks of alcohol per week. Tobacco:  reports that she has never smoked. She has never used smokeless tobacco. Social: Ms. Berhe  reports that she has never smoked. She has never used smokeless tobacco. She reports current alcohol use of about 3.0 standard drinks of alcohol per week. She reports that she does not use drugs. Medical:  has a past medical history of Anemia, Arthritis, Asthma, Barrett's esophagus, Bursitis, Depression, GERD (gastroesophageal  reflux disease), History of hiatal hernia, Hypothyroidism, and Nontoxic uninodular goiter. Surgical: Ms. Gunnels  has a past surgical history that includes  Cesarean section; Cholecystectomy, laparoscopic; Ankle arthroscopy (Right, 09/26/2018); Tenotomy / flexor tendon transfer (Right, 09/26/2018); Arthrodesis metatarsalphalangeal joint (mtpj) (Right, 09/26/2018); Exostectectomy toe (Right, 09/26/2018); Cholecystectomy; Colonoscopy with propofol (N/A, 06/16/2020); Esophagogastroduodenoscopy (egd) with propofol (N/A, 06/16/2020); and Robotic assisted laparoscopic hysterectomy and salpingectomy (N/A, 07/05/2020). Family: family history includes Cancer in her paternal grandfather; Diabetes in her mother; Heart disease in her mother; Hypertension in her maternal grandfather; Stroke in her brother, father, and mother.  Laboratory Chemistry Profile   Renal Lab Results  Component Value Date   BUN 7 07/01/2020   CREATININE 1.00 07/01/2020   GFRAA >60 07/01/2020   GFRNONAA >60 07/01/2020     Hepatic No results found for: AST, ALT, ALBUMIN, ALKPHOS, HCVAB, AMYLASE, LIPASE, AMMONIA   Electrolytes Lab Results  Component Value Date   NA 139 07/01/2020   K 4.5 07/01/2020   CL 108 07/01/2020   CALCIUM 9.2 07/01/2020     Bone No results found for: VD25OH, VD125OH2TOT, GY6599JT7, SV7793JQ3, 25OHVITD1, 25OHVITD2, 25OHVITD3, TESTOFREE, TESTOSTERONE   Inflammation (CRP: Acute Phase) (ESR: Chronic Phase) No results found for: CRP, ESRSEDRATE, LATICACIDVEN     Note: Above Lab results reviewed.  Recent Imaging Review  US Transvaginal Non-OB T/V images.  Anteverted uterus enlarged with central fibroids measured at  4.3 x 3.2 x 2.4 cm.  The overall uterine size is measured at 8.34 x 5.74 x  4.83 cm.  The endometrial cavity is deviated to the left side and  distorted by the central fibroid.  The IUD is noted within the  intrauterine cavity.  No obvious endometrial mass but cavity is difficult  to  evaluate due to distortion.  Both ovaries are normal in size with  sparse follicles.  No adnexal mass.  No free fluid in the posterior  cul-de-sac.  Note: Reviewed        Physical Exam  General appearance: Well nourished, well developed, and well hydrated. In no apparent acute distress Mental status: Alert, oriented x 3 (person, place, & time)       Respiratory: No evidence of acute respiratory distress Eyes: PERLA Vitals: BP (!) 150/99   Pulse 84   Temp (!) 97 F (36.1 C) (Temporal)   Resp 18   Ht '5\' 1"'  (1.549 m)   Wt 184 lb (83.5 kg)   LMP 09/10/2019   SpO2 100%   BMI 34.77 kg/m  BMI: Estimated body mass index is 34.77 kg/m as calculated from the following:   Height as of this encounter: '5\' 1"'  (1.549 m).   Weight as of this encounter: 184 lb (83.5 kg). Ideal: Ideal body weight: 47.8 kg (105 lb 6.1 oz) Adjusted ideal body weight: 62.1 kg (136 lb 13.2 oz)   Lower Extremity Exam    Side: Right lower extremity  Side: Left lower extremity  Stability: No instability observed          Stability: No instability observed          Skin & Extremity Inspection: Evidence of prior arthroplastic surgery redness, decreased hair growth, cool to touch, swelling  Skin & Extremity Inspection: Skin color, temperature, and hair growth are WNL. No peripheral edema or cyanosis. No masses, redness, swelling, asymmetry, or associated skin lesions. No contractures.  Functional ROM: Pain restricted ROM                  Functional ROM: Unrestricted ROM                  Muscle Tone/Strength: Functionally intact.  No obvious neuro-muscular anomalies detected.  Muscle Tone/Strength: Functionally intact. No obvious neuro-muscular anomalies detected.  Sensory (Neurological): Neurogenic pain pattern        Sensory (Neurological): Unimpaired        DTR: Patellar: deferred today Achilles: deferred today Plantar: deferred today  DTR: Patellar: deferred today Achilles: deferred today Plantar:  deferred today  Palpation: No palpable anomalies  Palpation: No palpable anomalies     Assessment   Status Diagnosis  Controlled Controlled Controlled 1. Complex regional pain syndrome type II of right lower limb   2. Neuropathic pain of right foot   3. History of foot surgery   4. Neuropathic pain of both legs   5. Chronic pain syndrome        Plan of Care   Ms. LECIA ESPERANZA has a current medication list which includes the following long-term medication(s): azelastine, cetirizine, duloxetine, gabapentin, iron polysaccharides, levothyroxine, montelukast, and omeprazole.  Pharmacotherapy (Medications Ordered): Meds ordered this encounter  Medications  . DULoxetine (CYMBALTA) 60 MG capsule    Sig: Take 1 capsule (60 mg total) by mouth daily.    Dispense:  30 capsule    Refill:  5  . gabapentin (NEURONTIN) 300 MG capsule    Sig: 300 mg in the morning, 300 mg in the afternoon, 600 mg nightly    Dispense:  120 capsule    Refill:  5    Follow-up plan:   Return in about 6 months (around 05/25/2021) for Medication Management, in person.     Right lower extremity CRPS, lumbar sympathetic nerve block denied by insurance.  Consider spinal cord stimulator trial in future.    Recent Visits No visits were found meeting these conditions. Showing recent visits within past 90 days and meeting all other requirements Today's Visits Date Type Provider Dept  11/24/20 Office Visit Gillis Santa, MD [facility]  Showing today's visits and meeting all other requirements Future Appointments No visits were found meeting these conditions. Showing future appointments within next 90 days and meeting all other requirements  I discussed the assessment and treatment plan with the patient. The patient was provided an opportunity to ask questions and all were answered. The patient agreed with the plan and demonstrated an understanding of the instructions.  Patient advised to call  back or seek an in-person evaluation if the symptoms or condition worsens.  Duration of encounter:80mnutes.  Note by: BGillis Santa MD Date: 11/24/2020; Time: 8:45 AM

## 2020-11-24 NOTE — Progress Notes (Signed)
Safety precautions to be maintained throughout the outpatient stay will include: orient to surroundings, keep bed in low position, maintain call bell within reach at all times, provide assistance with transfer out of bed and ambulation.  

## 2021-02-09 ENCOUNTER — Other Ambulatory Visit: Payer: Self-pay | Admitting: Obstetrics & Gynecology

## 2021-02-09 DIAGNOSIS — Z1231 Encounter for screening mammogram for malignant neoplasm of breast: Secondary | ICD-10-CM

## 2021-02-18 ENCOUNTER — Other Ambulatory Visit: Payer: Self-pay

## 2021-02-18 ENCOUNTER — Ambulatory Visit
Admission: RE | Admit: 2021-02-18 | Discharge: 2021-02-18 | Disposition: A | Discharge status: Home / Self Care | Payer: BC Managed Care – PPO | Source: Ambulatory Visit | Attending: Obstetrics & Gynecology | Admitting: Obstetrics & Gynecology

## 2021-02-18 DIAGNOSIS — Z1231 Encounter for screening mammogram for malignant neoplasm of breast: Secondary | ICD-10-CM

## 2021-03-14 ENCOUNTER — Other Ambulatory Visit: Payer: Self-pay | Admitting: Student in an Organized Health Care Education/Training Program

## 2021-04-03 ENCOUNTER — Ambulatory Visit: Payer: BC Managed Care – PPO | Admitting: Obstetrics & Gynecology

## 2021-05-16 ENCOUNTER — Telehealth: Payer: Self-pay | Admitting: Student in an Organized Health Care Education/Training Program

## 2021-05-16 NOTE — Telephone Encounter (Signed)
Called pharm and they scrolled down in the computer and found the script for cymbalta. Informed patient that it was at pharm and instructed her to make an appt for MM. Did Not see one scheduled.

## 2021-05-16 NOTE — Telephone Encounter (Signed)
Called. Pharm is closed at this time.

## 2021-05-17 ENCOUNTER — Encounter: Payer: BC Managed Care – PPO | Admitting: Student in an Organized Health Care Education/Training Program

## 2021-05-18 ENCOUNTER — Encounter: Payer: BC Managed Care – PPO | Admitting: Student in an Organized Health Care Education/Training Program

## 2021-05-25 ENCOUNTER — Encounter: Payer: BC Managed Care – PPO | Admitting: Student in an Organized Health Care Education/Training Program

## 2021-06-22 ENCOUNTER — Ambulatory Visit (INDEPENDENT_AMBULATORY_CARE_PROVIDER_SITE_OTHER): Payer: BC Managed Care – PPO | Admitting: Obstetrics & Gynecology

## 2021-06-22 ENCOUNTER — Encounter: Payer: Self-pay | Admitting: Obstetrics & Gynecology

## 2021-06-22 ENCOUNTER — Other Ambulatory Visit: Payer: Self-pay

## 2021-06-22 VITALS — BP 114/70 | HR 81 | Resp 16 | Ht 61.25 in | Wt 187.0 lb

## 2021-06-22 DIAGNOSIS — Z6835 Body mass index (BMI) 35.0-35.9, adult: Secondary | ICD-10-CM

## 2021-06-22 DIAGNOSIS — E6609 Other obesity due to excess calories: Secondary | ICD-10-CM | POA: Diagnosis not present

## 2021-06-22 DIAGNOSIS — R6882 Decreased libido: Secondary | ICD-10-CM | POA: Diagnosis not present

## 2021-06-22 DIAGNOSIS — Z01419 Encounter for gynecological examination (general) (routine) without abnormal findings: Secondary | ICD-10-CM

## 2021-06-22 DIAGNOSIS — Z90710 Acquired absence of both cervix and uterus: Secondary | ICD-10-CM

## 2021-06-22 DIAGNOSIS — E66812 Obesity, class 2: Secondary | ICD-10-CM

## 2021-06-22 NOTE — Progress Notes (Signed)
Valerie Welch 1971-05-15 [id number]   History:    50 y.o. [id number] Married  RP:  Established patient presenting for annual gyn exam   HPI:  S/P XI Robotic TLH/Bilateral Salpingectomy 07/05/2020.  No pelvic pain.  No pain with occasional IC. Low libido.  Urine/BMs wnl.  Breasts normal.  Screening mammo neg 01/2021.  BMI increased to 35.05.  CRP Syndrome affecting her hips, doing PT.  [place] 2021. Health labs with Fam MD.    Past medical history,surgical history, family history and social history were all reviewed and documented in the EPIC chart.  Gynecologic History Patient's last menstrual period was 09/10/2019.  Obstetric History OB History  Gravida Para Term Preterm AB Living  5 2     2 2   SAB IAB Ectopic Multiple Live Births  1            # Outcome Date GA Lbr Len/2nd Weight Sex Delivery Anes PTL Lv  5 Gravida           4 AB           3 SAB           2 Para           1 Para              ROS: A ROS was performed and pertinent positives and negatives are included in the history.  GENERAL: No fevers or chills. HEENT: No change in vision, no earache, sore throat or sinus congestion. NECK: No pain or stiffness. CARDIOVASCULAR: No chest pain or pressure. No palpitations. PULMONARY: No shortness of breath, cough or wheeze. GASTROINTESTINAL: No abdominal pain, nausea, vomiting or diarrhea, melena or bright red blood per rectum. GENITOURINARY: No urinary frequency, urgency, hesitancy or dysuria. MUSCULOSKELETAL: No joint or muscle pain, no back pain, no recent trauma. DERMATOLOGIC: No rash, no itching, no lesions. ENDOCRINE: No polyuria, polydipsia, no heat or cold intolerance. No recent change in weight. HEMATOLOGICAL: No anemia or easy bruising or bleeding. NEUROLOGIC: No headache, seizures, numbness, tingling or weakness. PSYCHIATRIC: No depression, no loss of interest in normal activity or change in sleep pattern.     Exam:   BP 114/70   Pulse 81   Resp 16   Ht 5'  1.25" (1.556 m)   Wt 187 lb (84.8 kg)   LMP 09/10/2019   BMI 35.05 kg/m   Body mass index is 35.05 kg/m.  General appearance : Well developed well nourished female. No acute distress HEENT: Eyes: no retinal hemorrhage or exudates,  Neck supple, trachea midline, no carotid bruits, no thyroidmegaly Lungs: Clear to auscultation, no rhonchi or wheezes, or rib retractions  Heart: Regular rate and rhythm, no murmurs or gallops Breast:Examined in sitting and supine position were symmetrical in appearance, no palpable masses or tenderness,  no skin retraction, no nipple inversion, no nipple discharge, no skin discoloration, no axillary or supraclavicular lymphadenopathy Abdomen: no palpable masses or tenderness, no rebound or guarding Extremities: no edema or skin discoloration or tenderness  Pelvic: Vulva: Normal             Vagina: No gross lesions or discharge  Cervix/Uterus absent  Adnexa  Without masses or tenderness  Anus: Normal   Assessment/Plan:  50 y.o. female for annual exam   1. Well female exam with routine gynecological exam Gynecologic exam status post total hysterectomy.  Cervix benign on pathology from hysterectomy specimen July 2021.  No indication for a Pap test at this time.  Breasts normal.  Screening mammo Negative 01/2021.  [place] 2021.  Health labs with Fam MD.    2. S/P total hysterectomy  3. Low libido Counseling done on low libido.  Recommendations made to take the lead.  4. Class 2 obesity due to excess calories without serious comorbidity with body mass index (BMI) of 35.0 to 35.9 in adult Recommend a low calorie/carb diet with intermittent fasting.  Aerobic activities 5 times a week and light weight lifting every 2 days.  Other orders - Docusate Sodium (DSS) 100 MG CAPS; Take by mouth.   Princess Bruins MD, 8:28 AM 06/22/2021

## 2021-06-27 ENCOUNTER — Ambulatory Visit
Payer: BC Managed Care – PPO | Attending: Student in an Organized Health Care Education/Training Program | Admitting: Student in an Organized Health Care Education/Training Program

## 2021-06-27 ENCOUNTER — Encounter: Payer: Self-pay | Admitting: Student in an Organized Health Care Education/Training Program

## 2021-06-27 ENCOUNTER — Other Ambulatory Visit: Payer: Self-pay

## 2021-06-27 VITALS — BP 144/93 | HR 86 | Temp 97.1°F | Resp 16 | Ht 61.0 in | Wt 185.0 lb

## 2021-06-27 DIAGNOSIS — Z9889 Other specified postprocedural states: Secondary | ICD-10-CM

## 2021-06-27 DIAGNOSIS — G5793 Unspecified mononeuropathy of bilateral lower limbs: Secondary | ICD-10-CM

## 2021-06-27 DIAGNOSIS — G5771 Causalgia of right lower limb: Secondary | ICD-10-CM | POA: Diagnosis present

## 2021-06-27 DIAGNOSIS — M792 Neuralgia and neuritis, unspecified: Secondary | ICD-10-CM | POA: Diagnosis present

## 2021-06-27 DIAGNOSIS — M1612 Unilateral primary osteoarthritis, left hip: Secondary | ICD-10-CM | POA: Insufficient documentation

## 2021-06-27 DIAGNOSIS — G894 Chronic pain syndrome: Secondary | ICD-10-CM | POA: Insufficient documentation

## 2021-06-27 DIAGNOSIS — M1611 Unilateral primary osteoarthritis, right hip: Secondary | ICD-10-CM | POA: Insufficient documentation

## 2021-06-27 MED ORDER — GABAPENTIN 300 MG PO CAPS: ORAL_CAPSULE | ORAL | 5 refills | Status: DC

## 2021-06-27 MED ORDER — DICLOFENAC SODIUM 1 % EX GEL: 4.0000 g | Freq: Four times a day (QID) | CUTANEOUS | 2 refills | Status: AC

## 2021-06-27 MED ORDER — DULOXETINE HCL 60 MG PO CPEP: 60.0000 mg | ORAL_CAPSULE | Freq: Every day | ORAL | 2 refills | Status: DC

## 2021-06-27 NOTE — Progress Notes (Addendum)
PROVIDER NOTE: Information contained herein reflects review and annotations entered in association with encounter. Interpretation of such information and data should be left to medically-trained personnel. Information provided to patient can be located elsewhere in the medical record under "Patient Instructions". Document created using STT-dictation technology, any transcriptional errors that may result from process are unintentional.    Patient: Valerie Welch  Service Category: E/M  Provider: Gillis Santa, MD  DOB: 12-21-1971  DOS: 06/27/2021  Specialty: Interventional Pain Management  MRN: [id number]  Setting: Ambulatory outpatient  PCP: Tracie Harrier, MD  Type: Established Patient    Referring Provider: Tracie Harrier, MD  Location: Office  Delivery: Face-to-face     HPI  Valerie Welch, a 50 y.o. year old female, is here today because of her Complex regional pain syndrome type II of right lower limb [G57.71]. [name] primary complain today is Back Pain and Leg Pain Last encounter: My last encounter with her was on 11/24/2020. Pertinent problems: [name] has History of foot surgery; Generalized anxiety disorder; Neuropathic pain of both legs; Complex regional pain syndrome type II of right lower limb; and Neuropathic pain of right foot on their pertinent problem list. Pain Assessment: Severity of Chronic pain is reported as a 4 /10. Location: Back Lower/Radiates to hips bilateral, right hip is worse. Onset: More than a month ago. Quality: Constant, Pounding, Aching ("push"). Timing: Constant. Modifying factor(s): Aleve, tylenol, decrease glutens, increase vitamins and decrease inflammation. Over the counter cream (volteran) helps as well.. Vitals:  height is _0  (1.549 m) and weight is 185 lb (83.9 kg). Her temporal temperature is 97.1 F (36.2 C) (abnormal). Her blood pressure is 144/93 (abnormal) and her pulse is 86. Her respiration is 16 and oxygen  saturation is 100%.   Reason for encounter: medication management.   Has been working with PT primarily on in her left hip. States that her right leg pain has improved and isn't as severe in the morning.  Patient continues multimodal pain regimen as prescribed.  States that it provides pain relief and improvement in functional status.  Unfortunately, her previous request for lumbar sympathetic nerve block was denied by the insurance which is odd as that is the diagnostic/therapeutic goal standard for CRPS.  We will try and resubmit after the new year.  We have discussed spinal cord stimulation as a potential therapeutic option for her CRPS.    ROS  Constitutional: Denies any fever or chills Gastrointestinal: No reported hemesis, hematochezia, vomiting, or acute GI distress Musculoskeletal:  left hip pain, right leg pain Neurological: No reported episodes of acute onset apraxia, aphasia, dysarthria, agnosia, amnesia, paralysis, loss of coordination, or loss of consciousness  Medication Review  ALPRAZolam, B-12, Bepotastine Besilate, DSS, DULoxetine, Fluticasone-Salmeterol, Melatonin, azelastine, buPROPion, cetirizine, diclofenac Sodium, diclofenac sodium, gabapentin, iron polysaccharides, levothyroxine, montelukast, multivitamin with minerals, omeprazole, polyethylene glycol, rosuvastatin, trolamine salicylate, and vitamin C  History Review  Allergy: [name] is allergic to beef-derived products, other, percocet [oxycodone-acetaminophen], and pitocin [oxytocin]. Drug: [name]  reports no history of drug use. Alcohol:  reports previous alcohol use. Tobacco:  reports that she has never smoked. She has never used smokeless tobacco. Social: Ms. Sole  reports that she has never smoked. She has never used smokeless tobacco. She reports previous alcohol use. She reports that she does not use drugs. Medical:  has a past medical history of Anemia, Arthritis, Asthma,  Barrett's esophagus, Bursitis, Depression, Fibromyalgia, GERD (gastroesophageal reflux disease), History of hiatal hernia, Hypothyroidism, and  Nontoxic uninodular goiter. Surgical: [name]  has a past surgical history that includes Cesarean section; Cholecystectomy, laparoscopic; Ankle arthroscopy (Right, 09/26/2018); Tenotomy / flexor tendon transfer (Right, 09/26/2018); Arthrodesis metatarsalphalangeal joint (mtpj) (Right, 09/26/2018); Exostectectomy toe (Right, 09/26/2018); Cholecystectomy; Colonoscopy with propofol (N/A, 06/16/2020); Esophagogastroduodenoscopy (egd) with propofol (N/A, 06/16/2020); and Robotic assisted laparoscopic hysterectomy and salpingectomy (N/A, 07/05/2020). Family: family history includes Cancer in her paternal grandfather; Diabetes in her mother; Heart disease in her mother; Hypertension in her maternal grandfather; Stroke in her brother, father, and mother.  Laboratory Chemistry Profile   Renal Lab Results  Component Value Date   BUN 7 07/01/2020   CREATININE 1.00 07/01/2020   GFRAA >60 07/01/2020   GFRNONAA >60 07/01/2020     Hepatic No results found for: AST, ALT, ALBUMIN, ALKPHOS, HCVAB, AMYLASE, LIPASE, AMMONIA   Electrolytes Lab Results  Component Value Date   NA 139 07/01/2020   K 4.5 07/01/2020   CL 108 07/01/2020   CALCIUM 9.2 07/01/2020     Bone No results found for: VD25OH, VD125OH2TOT, SF6812XN1, ZG0174BS4, 25OHVITD1, 25OHVITD2, 25OHVITD3, TESTOFREE, TESTOSTERONE   Inflammation (CRP: Acute Phase) (ESR: Chronic Phase) No results found for: CRP, ESRSEDRATE, LATICACIDVEN     Note: Above Lab results reviewed.  Recent Imaging Review  MM 3D SCREEN BREAST BILATERAL CLINICAL DATA:  Screening.  EXAM: DIGITAL SCREENING BILATERAL MAMMOGRAM WITH TOMOSYNTHESIS AND CAD  TECHNIQUE: Bilateral screening digital craniocaudal and mediolateral oblique mammograms were obtained. Bilateral screening digital breast tomosynthesis was performed. The images  were evaluated with computer-aided detection.  COMPARISON:  Previous exam(s).  ACR Breast Density Category c: The breast tissue is heterogeneously dense, which may obscure small masses.  FINDINGS: There are no findings suspicious for malignancy. The images were evaluated with computer-aided detection.  IMPRESSION: No mammographic evidence of malignancy. A result letter of this screening mammogram will be mailed directly to the patient.  RECOMMENDATION: Screening mammogram in one year. (Code:SM-B-01Y)  BI-RADS CATEGORY  1: Negative.  Electronically Signed   By: Lajean Manes M.D.   On: 02/21/2021 12:54 Note: Reviewed        Physical Exam  General appearance: Well nourished, well developed, and well hydrated. In no apparent acute distress Mental status: Alert, oriented x 3 (person, place, & time)       Respiratory: No evidence of acute respiratory distress Eyes: PERLA Vitals: BP (!) 144/93 (BP Location: Left Arm, Patient Position: Sitting, Cuff Size: Large)   Pulse 86   Temp (!) 97.1 F (36.2 C) (Temporal)   Resp 16   Ht _0  (1.549 m)   Wt 185 lb (83.9 kg)   LMP 09/10/2019   SpO2 100%   BMI 34.96 kg/m  BMI: Estimated body mass index is 34.96 kg/m as calculated from the following:   Height as of this encounter: _1  (1.549 m).   Weight as of this encounter: 185 lb (83.9 kg). Ideal: Ideal body weight: 47.8 kg (105 lb 6.1 oz) Adjusted ideal body weight: 62.2 kg (137 lb 3.6 oz)  Left hip limited range of motion, TTP   Lower Extremity Exam      Side: Right lower extremity   Side: Left lower extremity  Stability: No instability observed           Stability: No instability observed          Skin & Extremity Inspection: Evidence of prior arthroplastic surgery redness, decreased hair growth, cool to touch, swelling   Skin & Extremity Inspection: Skin color, temperature, and hair  growth are WNL. No peripheral edema or cyanosis. No masses, redness, swelling, asymmetry, or  associated skin lesions. No contractures.  Functional ROM: Pain restricted ROM                   Functional ROM: Unrestricted ROM                  Muscle Tone/Strength: Functionally intact. No obvious neuro-muscular anomalies detected.   Muscle Tone/Strength: Functionally intact. No obvious neuro-muscular anomalies detected.  Sensory (Neurological): Neurogenic pain pattern         Sensory (Neurological): Unimpaired        DTR: Patellar: deferred today Achilles: deferred today Plantar: deferred today   DTR: Patellar: deferred today Achilles: deferred today Plantar: deferred today  Palpation: No palpable anomalies   Palpation: No palpable anomalies      Assessment   Status Diagnosis  Controlled Controlled Controlled 1. Complex regional pain syndrome type II of right lower limb   2. Arthritis of right hip   3. Arthritis of left hip   4. Neuropathic pain of right foot   5. History of foot surgery   6. Neuropathic pain of both legs   7. Chronic pain syndrome        Plan of Care   Ms. KINA SHIFFMAN has a current medication list which includes the following long-term medication(s): azelastine, cetirizine, iron polysaccharides, levothyroxine, montelukast, omeprazole, duloxetine, and gabapentin.  Pharmacotherapy (Medications Ordered): Meds ordered this encounter  Medications   DULoxetine (CYMBALTA) 60 MG capsule    Sig: Take 1 capsule (60 mg total) by mouth daily.    Dispense:  90 capsule    Refill:  2   diclofenac Sodium (VOLTAREN) 1 % GEL    Sig: Apply 4 g topically 4 (four) times daily.    Dispense:  350 g    Refill:  2    Do not add this medication to the electronic "Automatic Refill" notification system. Patient may have prescription filled one day early if pharmacy is closed on scheduled refill date.   gabapentin (NEURONTIN) 300 MG capsule    Sig: 300 mg in the morning, 300 mg in the afternoon, 600 mg nightly    Dispense:  120 capsule    Refill:  5    Discussed and consider lumbar facet medial branch nerve blocks for lumbar spondylosis and facet arthrosis Discussed and consider right lumbar sympathetic nerve block for right CRPS Discussed and consider spinal cord stimulation I also recommend a foot stool that the patient can utilize to assist with her lower extremity neuropathic pain  Follow-up plan:   Return in about 6 months (around 12/28/2021) for Medication Management, in person.     Right lower extremity CRPS, lumbar sympathetic nerve block denied by insurance.  Consider spinal cord stimulator trial in future.    Recent Visits No visits were found meeting these conditions. Showing recent visits within past 90 days and meeting all other requirements Today's Visits Date Type Provider Dept  06/27/21 Office Visit Gillis Santa, MD [facility]  Showing today's visits and meeting all other requirements Future Appointments No visits were found meeting these conditions. Showing future appointments within next 90 days and meeting all other requirements I discussed the assessment and treatment plan with the patient. The patient was provided an opportunity to ask questions and all were answered. The patient agreed with the plan and demonstrated an understanding of the instructions.  Patient advised to call back or seek an  in-person evaluation if the symptoms or condition worsens.  Duration of encounter:56mnutes.  Note by: BGillis Santa MD Date: 06/27/2021; Time: 9:19 AM

## 2021-06-27 NOTE — Progress Notes (Signed)
Safety precautions to be maintained throughout the outpatient stay will include: orient to surroundings, keep bed in low position, maintain call bell within reach at all times, provide assistance with transfer out of bed and ambulation.  

## 2021-06-28 ENCOUNTER — Encounter: Payer: Self-pay | Admitting: Student in an Organized Health Care Education/Training Program

## 2021-09-29 ENCOUNTER — Other Ambulatory Visit (HOSPITAL_BASED_OUTPATIENT_CLINIC_OR_DEPARTMENT_OTHER): Payer: Self-pay | Admitting: Surgery

## 2021-09-29 ENCOUNTER — Other Ambulatory Visit: Payer: Self-pay | Admitting: Surgery

## 2021-09-29 DIAGNOSIS — S83232A Complex tear of medial meniscus, current injury, left knee, initial encounter: Secondary | ICD-10-CM

## 2021-10-04 ENCOUNTER — Other Ambulatory Visit: Payer: Self-pay | Admitting: Student in an Organized Health Care Education/Training Program

## 2021-10-10 ENCOUNTER — Ambulatory Visit
Admission: RE | Admit: 2021-10-10 | Discharge: 2021-10-10 | Disposition: A | Discharge status: Home / Self Care | Payer: BC Managed Care – PPO | Source: Ambulatory Visit | Attending: Surgery | Admitting: Surgery

## 2021-10-10 ENCOUNTER — Other Ambulatory Visit: Payer: Self-pay

## 2021-10-10 DIAGNOSIS — S83232A Complex tear of medial meniscus, current injury, left knee, initial encounter: Secondary | ICD-10-CM

## 2021-11-13 ENCOUNTER — Ambulatory Visit
Payer: BC Managed Care – PPO | Attending: Student in an Organized Health Care Education/Training Program | Admitting: Student in an Organized Health Care Education/Training Program

## 2021-11-13 ENCOUNTER — Encounter: Payer: Self-pay | Admitting: Student in an Organized Health Care Education/Training Program

## 2021-11-13 ENCOUNTER — Other Ambulatory Visit: Payer: Self-pay

## 2021-11-13 VITALS — BP 162/109 | HR 84 | Temp 97.1°F | Resp 16 | Ht 61.0 in | Wt 184.0 lb

## 2021-11-13 DIAGNOSIS — M1612 Unilateral primary osteoarthritis, left hip: Secondary | ICD-10-CM | POA: Diagnosis not present

## 2021-11-13 DIAGNOSIS — G5771 Causalgia of right lower limb: Secondary | ICD-10-CM | POA: Diagnosis not present

## 2021-11-13 DIAGNOSIS — G5793 Unspecified mononeuropathy of bilateral lower limbs: Secondary | ICD-10-CM

## 2021-11-13 DIAGNOSIS — M5416 Radiculopathy, lumbar region: Secondary | ICD-10-CM | POA: Diagnosis not present

## 2021-11-13 DIAGNOSIS — M792 Neuralgia and neuritis, unspecified: Secondary | ICD-10-CM | POA: Diagnosis present

## 2021-11-13 DIAGNOSIS — Z9889 Other specified postprocedural states: Secondary | ICD-10-CM | POA: Diagnosis present

## 2021-11-13 DIAGNOSIS — G894 Chronic pain syndrome: Secondary | ICD-10-CM

## 2021-11-13 DIAGNOSIS — M1611 Unilateral primary osteoarthritis, right hip: Secondary | ICD-10-CM | POA: Diagnosis not present

## 2021-11-13 NOTE — Patient Instructions (Signed)

## 2021-11-13 NOTE — Progress Notes (Signed)
PROVIDER NOTE: Information contained herein reflects review and annotations entered in association with encounter. Interpretation of such information and data should be left to medically-trained personnel. Information provided to patient can be located elsewhere in the medical record under "Patient Instructions". Document created using STT-dictation technology, any transcriptional errors that may result from process are unintentional.    Patient: Valerie Welch  Service Category: E/M  Provider: Gillis Santa, MD  DOB: Jul 03, 1971  DOS: 11/13/2021  Specialty: Interventional Pain Management  MRN: [id number]  Setting: Ambulatory outpatient  PCP: Tracie Harrier, MD  Type: Established Patient    Referring Provider: Tracie Harrier, MD  Location: Office  Delivery: Face-to-face     HPI  Valerie Welch, a 50 y.o. year old female, is here today because of her Lumbar radiculopathy [M54.16]. Valerie Welch primary complain today is Back Pain (Lower, right is worse), Knee Pain (Bilat,Left is worse), and Shoulder Pain (left) Last encounter: My last encounter with her was on 06/27/21 Pertinent problems: Valerie Welch has History of foot surgery; Generalized anxiety disorder; Neuropathic pain of both legs; Complex regional pain syndrome type II of right lower limb; and Neuropathic pain of right foot on their pertinent problem list. Pain Assessment: Severity of Chronic pain is reported as a 6 /10. Location: Back Lower/to right mid thigh. Onset: More than a month ago. Quality: Constant, Aching. Timing: Constant. Modifying factor(s): meds. Vitals:  height is '5\' 1"'  (1.549 m) and weight is 184 lb (83.5 kg). Her temporal temperature is 97.1 F (36.2 C) (abnormal). Her blood pressure is 162/109 (abnormal) and her pulse is 84. Her respiration is 16 and oxygen saturation is 100%.   Reason for encounter: medication management.   Has been working with PT primarily on in her left hip.  She states that  she has completed physical therapy over the last 6 months.  She says it was helpful overall for her fibromyalgia but now she is having increased lower right back pain with radiation into her right buttock and right posterior lateral thigh in a dermatomal distribution.  This is related to a lumbar radicular pain flare.  She is being referred and we have also discussed a right-sided lumbar epidural steroid injection.   ROS  Constitutional: Denies any fever or chills Gastrointestinal: No reported hemesis, hematochezia, vomiting, or acute GI distress Musculoskeletal:  right buttock, right leg pain Neurological: No reported episodes of acute onset apraxia, aphasia, dysarthria, agnosia, amnesia, paralysis, loss of coordination, or loss of consciousness  Medication Review  ALPRAZolam, B-12, Bepotastine Besilate, DSS, DULoxetine, Fluticasone-Salmeterol, HYDROcodone-Acetaminophen, Melatonin, azelastine, buPROPion, cetirizine, diclofenac sodium, gabapentin, iron polysaccharides, levothyroxine, montelukast, multivitamin with minerals, omeprazole, polyethylene glycol, rosuvastatin, trolamine salicylate, and vitamin C  History Review  Allergy: Valerie Welch is allergic to beef-derived products, other, percocet [oxycodone-acetaminophen], and pitocin [oxytocin]. [name]  reports no history of drug use. Alcohol:  reports that she does not currently use alcohol. Tobacco:  reports that she has never smoked. She has never used smokeless tobacco. [name]  reports that she has never smoked. She has never used smokeless tobacco. She reports that she does not currently use alcohol. She reports that she does not use drugs. Medical:  has a past medical history of Anemia, Arthritis, Asthma, Barrett's esophagus, Bursitis, Depression, Fibromyalgia, GERD (gastroesophageal reflux disease), History of hiatal hernia, Hypothyroidism, and Nontoxic uninodular goiter. Surgical: Valerie Welch   has a past surgical history that includes Cesarean section; Cholecystectomy, laparoscopic; Ankle arthroscopy (Right, 09/26/2018); Tenotomy / flexor tendon transfer (  Right, 09/26/2018); Arthrodesis metatarsalphalangeal joint (mtpj) (Right, 09/26/2018); Exostectectomy toe (Right, 09/26/2018); Cholecystectomy; Colonoscopy with propofol (N/A, 06/16/2020); Esophagogastroduodenoscopy (egd) with propofol (N/A, 06/16/2020); and Robotic assisted laparoscopic hysterectomy and salpingectomy (N/A, 07/05/2020). Family: family history includes Cancer in her paternal grandfather; Diabetes in her mother; Heart disease in her mother; Hypertension in her maternal grandfather; Stroke in her brother, father, and mother.  Laboratory Chemistry Profile   Renal Lab Results  Component Value Date   BUN 7 07/01/2020   CREATININE 1.00 07/01/2020   GFRAA >60 07/01/2020   GFRNONAA >60 07/01/2020     Hepatic No results found for: AST, ALT, ALBUMIN, ALKPHOS, HCVAB, AMYLASE, LIPASE, AMMONIA   Electrolytes Lab Results  Component Value Date   NA 139 07/01/2020   K 4.5 07/01/2020   CL 108 07/01/2020   CALCIUM 9.2 07/01/2020     Bone No results found for: VD25OH, VD125OH2TOT, [id number], [id number], 25OHVITD1, 25OHVITD2, 25OHVITD3, TESTOFREE, TESTOSTERONE   Inflammation (CRP: Acute Phase) (ESR: Chronic Phase) No results found for: CRP, ESRSEDRATE, LATICACIDVEN     Note: Above Lab results reviewed.  Recent Imaging Review  MR KNEE LEFT WO CONTRAST CLINICAL DATA:  Patient complains of ongoing left knee pain. No known injury.  EXAM: MRI OF THE LEFT KNEE WITHOUT CONTRAST  TECHNIQUE: Multiplanar, multisequence MR imaging of the knee was performed. No intravenous contrast was administered.  COMPARISON:  None.  FINDINGS: MENISCI  Medial meniscus:  Intact.  Lateral meniscus: Intact.  LIGAMENTS  Cruciates: Intact ACL and PCL.  Collaterals: Prominent thickening and heterogeneity of the proximal to mid MCL with  associated periligamentous edema compatible with a grade 2 sprain. Lateral collateral ligament complex intact.  CARTILAGE  Patellofemoral: Diffuse chondral thinning with multifocal full-thickness chondral fissures of the patella involving the lateral facets greater than medial with associated subchondral cyst formation. No trochlear chondral defect.  Medial: Full-thickness cartilage defect of the central medial femoral condyle measuring approximately 2.5 x 2.5 mm with small delamination component extending laterally (series 5, images 11-12). Underlying subchondral cystic changes.  Lateral:  No chondral defect.  Joint: Trace knee joint effusion. Focal edema within the superolateral aspect of Hoffa's fat.  Popliteal Fossa: Small leaking Baker's cyst. Intact popliteus tendon.  Extensor Mechanism: Intact quadriceps tendon and patellofemoral tendon.  Bones: Tricompartmental joint space narrowing with marginal osteophyte formation, most pronounced within the patellofemoral compartment. No fracture. No suspicious bone lesion.  Other: Nonspecific subcutaneous edema, most pronounced within the prepatellar soft tissues.  IMPRESSION: 1. Grade 2 MCL sprain. 2. Small full-thickness cartilage defect of the central medial femoral condyle with small delamination component. 3. Moderate patellar osteoarthritis. 4. Focal edema within the superolateral aspect of Hoffa's fat pad, which can be seen in the setting of patellar tendon-lateral femoral condyle friction syndrome. 5. Trace knee joint effusion. Small leaking Baker's cyst. 6. Intact menisci.  Electronically Signed   By: Davina Poke D.O.   On: 10/11/2021 16:57 Note: Reviewed        Physical Exam  General appearance: Well nourished, well developed, and well hydrated. In no apparent acute distress Mental status: Alert, oriented x 3 (person, place, & time)       Respiratory: No evidence of acute respiratory distress Eyes:  PERLA Vitals: BP (!) 162/109   Pulse 84   Temp (!) 97.1 F (36.2 C) (Temporal)   Resp 16   Ht '5\' 1"'  (1.549 m)   Wt 184 lb (83.5 kg)   LMP 09/10/2019   SpO2 100%   BMI 34.77 kg/m  BMI: Estimated body mass index is 34.77 kg/m as calculated from the following:   Height as of this encounter: '5\' 1"'  (1.549 m).   Weight as of this encounter: 184 lb (83.5 kg). Ideal: Ideal body weight: 47.8 kg (105 lb 6.1 oz) Adjusted ideal body weight: 62.1 kg (136 lb 13.2 oz)  Lumbar Spine Area Exam  Skin & Axial Inspection: No masses, redness, or swelling Alignment: Symmetrical Functional ROM: Pain restricted ROM       Stability: No instability detected Muscle Tone/Strength: Functionally intact. No obvious neuro-muscular anomalies detected. Sensory (Neurological): Dermatomal pain pattern right L4-L5 Palpation: No palpable anomalies       Provocative Tests: Hyperextension/rotation test: deferred today       Lumbar quadrant test (Kemp's test): deferred today          Lower Extremity Exam      Side: Right lower extremity   Side: Left lower extremity  Stability: No instability observed           Stability: No instability observed          Skin & Extremity Inspection: Evidence of prior arthroplastic surgery redness, decreased hair growth, cool to touch, swelling   Skin & Extremity Inspection: Skin color, temperature, and hair growth are WNL. No peripheral edema or cyanosis. No masses, redness, swelling, asymmetry, or associated skin lesions. No contractures.  Functional ROM: Pain restricted ROM                   Functional ROM: Unrestricted ROM                  Muscle Tone/Strength: Functionally intact. No obvious neuro-muscular anomalies detected.   Muscle Tone/Strength: Functionally intact. No obvious neuro-muscular anomalies detected.  Sensory (Neurological): Neurogenic pain pattern positive straight leg raise test         Sensory (Neurological): Unimpaired        DTR: Patellar: deferred  today Achilles: deferred today Plantar: deferred today   DTR: Patellar: deferred today Achilles: deferred today Plantar: deferred today  Palpation: No palpable anomalies   Palpation: No palpable anomalies      Assessment   Status Diagnosis  Having a Flare-up Controlled Controlled 1. Lumbar radiculopathy   2. Complex regional pain syndrome type II of right lower limb   3. Arthritis of right hip   4. Arthritis of left hip   5. Neuropathic pain of right foot   6. History of foot surgery   7. Neuropathic pain of both legs   8. Chronic pain syndrome         Plan of Care   Valerie Welch has a current medication list which includes the following long-term medication(s): azelastine, cetirizine, duloxetine, gabapentin, iron polysaccharides, levothyroxine, montelukast, and omeprazole.  Orders Placed This Encounter  Procedures   Lumbar Epidural Injection    Standing Status:   Future    Standing Expiration Date:   12/13/2021    Scheduling Instructions:     Procedure: Interlaminar Lumbar Epidural Steroid injection (LESI)            Laterality: L4/5     Sedation: PO Valium     Timeframe: ASAA    Order Specific Question:   Where will this procedure be performed?    Answer:   [facility]      Follow-up plan:   Return in about 9 days (around 11/22/2021) for Right L4/5 ESI , minimal sedation (PO Valium).     Right lower  extremity CRPS, lumbar sympathetic nerve block denied by insurance.  Consider spinal cord stimulator trial in future.    Recent Visits No visits were found meeting these conditions. Showing recent visits within past 90 days and meeting all other requirements Today's Visits Date Type Provider Dept  11/13/21 Office Visit Gillis Santa, MD [facility]  Showing today's visits and meeting all other requirements Future Appointments Date Type Provider Dept  12/26/21 Appointment Gillis Santa, MD [facility]  Showing future  appointments within next 90 days and meeting all other requirements I discussed the assessment and treatment plan with the patient. The patient was provided an opportunity to ask questions and all were answered. The patient agreed with the plan and demonstrated an understanding of the instructions.  Patient advised to call back or seek an in-person evaluation if the symptoms or condition worsens.  Duration of encounter:2mnutes.  Note by: BGillis Santa MD Date: 11/13/2021; Time: 3:07 PM

## 2021-11-13 NOTE — Progress Notes (Signed)
Safety precautions to be maintained throughout the outpatient stay will include: orient to surroundings, keep bed in low position, maintain call bell within reach at all times, provide assistance with transfer out of bed and ambulation.  

## 2021-11-21 ENCOUNTER — Telehealth: Payer: Self-pay

## 2021-11-21 NOTE — Telephone Encounter (Signed)
Patient is asking if she can take xanax 0.25 mg for procedure instead of valium and she would take them before she arrives.  If so, how many should she take?

## 2021-11-21 NOTE — Telephone Encounter (Signed)
I called her to schedule her procedure for Monday and she was asking if she could take her own valium. They are the lowest dose so she wants to know if she should take 2 or what. Please call with instructions.

## 2021-11-21 NOTE — Telephone Encounter (Signed)
Called to patient to let her know that DR Holley Raring has approved her to take 0.5 mg Xanax that she has at home.  She will take them approx 45 minutes prior to arrival.  She does know to bring a driver.

## 2021-11-27 ENCOUNTER — Encounter: Payer: Self-pay | Admitting: Student in an Organized Health Care Education/Training Program

## 2021-11-27 ENCOUNTER — Ambulatory Visit (HOSPITAL_BASED_OUTPATIENT_CLINIC_OR_DEPARTMENT_OTHER): Payer: BC Managed Care – PPO | Admitting: Student in an Organized Health Care Education/Training Program

## 2021-11-27 ENCOUNTER — Ambulatory Visit
Admission: RE | Admit: 2021-11-27 | Discharge: 2021-11-27 | Disposition: A | Discharge status: Home / Self Care | Payer: BC Managed Care – PPO | Source: Ambulatory Visit | Attending: Student in an Organized Health Care Education/Training Program | Admitting: Student in an Organized Health Care Education/Training Program

## 2021-11-27 ENCOUNTER — Other Ambulatory Visit: Payer: Self-pay

## 2021-11-27 DIAGNOSIS — G894 Chronic pain syndrome: Secondary | ICD-10-CM

## 2021-11-27 DIAGNOSIS — M5416 Radiculopathy, lumbar region: Secondary | ICD-10-CM | POA: Diagnosis present

## 2021-11-27 MED ORDER — LIDOCAINE HCL 2 % IJ SOLN
INTRAMUSCULAR | Status: AC
  Filled 2021-11-27: qty 20

## 2021-11-27 MED ORDER — IOHEXOL 180 MG/ML  SOLN
10.0000 mL | Freq: Once | INTRAMUSCULAR | Status: AC
  Administered 2021-11-27: 10 mL via EPIDURAL

## 2021-11-27 MED ORDER — LIDOCAINE HCL 2 % IJ SOLN
20.0000 mL | Freq: Once | INTRAMUSCULAR | Status: AC
  Administered 2021-11-27: 400 mg

## 2021-11-27 MED ORDER — DEXAMETHASONE SODIUM PHOSPHATE 10 MG/ML IJ SOLN
INTRAMUSCULAR | Status: AC
  Filled 2021-11-27: qty 1

## 2021-11-27 MED ORDER — IOHEXOL 180 MG/ML  SOLN
INTRAMUSCULAR | Status: AC
  Filled 2021-11-27: qty 20

## 2021-11-27 MED ORDER — DEXAMETHASONE SODIUM PHOSPHATE 10 MG/ML IJ SOLN
10.0000 mg | Freq: Once | INTRAMUSCULAR | Status: AC
  Administered 2021-11-27: 10 mg

## 2021-11-27 MED ORDER — ROPIVACAINE HCL 2 MG/ML IJ SOLN
2.0000 mL | Freq: Once | INTRAMUSCULAR | Status: AC
  Administered 2021-11-27: 20 mL via EPIDURAL

## 2021-11-27 MED ORDER — ROPIVACAINE HCL 2 MG/ML IJ SOLN
INTRAMUSCULAR | Status: AC
  Filled 2021-11-27: qty 20

## 2021-11-27 MED ORDER — SODIUM CHLORIDE 0.9% FLUSH
2.0000 mL | Freq: Once | INTRAVENOUS | Status: AC
  Administered 2021-11-27: 10 mL

## 2021-11-27 MED ORDER — SODIUM CHLORIDE (PF) 0.9 % IJ SOLN
INTRAMUSCULAR | Status: AC
  Filled 2021-11-27: qty 10

## 2021-11-27 NOTE — Progress Notes (Signed)
Safety precautions to be maintained throughout the outpatient stay will include: orient to surroundings, keep bed in low position, maintain call bell within reach at all times, provide assistance with transfer out of bed and ambulation.  

## 2021-11-27 NOTE — Patient Instructions (Signed)

## 2021-11-27 NOTE — Progress Notes (Signed)
PROVIDER NOTE: Information contained herein reflects review and annotations entered in association with encounter. Interpretation of such information and data should be left to medically-trained personnel. Information provided to patient can be located elsewhere in the medical record under "Patient Instructions". Document created using STT-dictation technology, any transcriptional errors that may result from process are unintentional.    Patient: Valerie Welch  Service Category: Procedure Provider: Gillis Santa, MD DOB: 04-14-1971 DOS: 11/27/2021 Location: [facility] MRN: [id number] Setting: Ambulatory - outpatient Referring Provider: Gillis Santa, MD Type: Established Patient Specialty: Interventional Pain Management PCP: Tracie Harrier, MD  Primary Reason for Visit: Interventional Pain Management Treatment. CC: Back Pain   Procedure:          Anesthesia, Analgesia, Anxiolysis:  Type: Therapeutic Inter-Laminar Epidural Steroid Injection  #1  Region: Lumbar Level: L4-5 Level. Laterality: Right-Sided         Anesthesia: Local (1-2% Lidocaine)  Anxiolysis: None  Sedation: None  Guidance: Fluoroscopy           Position: Prone with head of the table was raised to facilitate breathing.   Indications: 1. Chronic pain syndrome   2. Lumbar radiculopathy    Pain Score: Pre-procedure: 7 /10 Post-procedure: 7 /10    Pre-op H&P Assessment:  Valerie Welch is a 50 y.o. (year old), female patient, seen today for interventional treatment. She  has a past surgical history that includes Cesarean section; Cholecystectomy, laparoscopic; Ankle arthroscopy (Right, 09/26/2018); Tenotomy / flexor tendon transfer (Right, 09/26/2018); Arthrodesis metatarsalphalangeal joint (mtpj) (Right, 09/26/2018); Exostectectomy toe (Right, 09/26/2018); Cholecystectomy; Colonoscopy with propofol (N/A, 06/16/2020); Esophagogastroduodenoscopy (egd) with propofol (N/A, 06/16/2020); and Robotic assisted  laparoscopic hysterectomy and salpingectomy (N/A, 07/05/2020). Ms. Spark has a current medication list which includes the following prescription(s): alprazolam, vitamin c, azelastine, bepreve, bupropion, bupropion, cetirizine, b-12, diclofenac sodium, dss, duloxetine, fluticasone-salmeterol, gabapentin, hydrocodone-acetaminophen, iron polysaccharides, levothyroxine, montelukast, multivitamin with minerals, omeprazole, polyethylene glycol, rosuvastatin, trolamine salicylate, and melatonin. Her primarily concern today is the Back Pain  Initial Vital Signs:  Pulse/HCG Rate: 89ECG Heart Rate: 80 Temp:  (!) 96.6 F (35.9 C) Resp: 16 BP:  (!) 155/93 SpO2: 100 %  BMI: Estimated body mass index is 34.77 kg/m as calculated from the following:   Height as of this encounter: 5\' 1"  (1.549 m).   Weight as of this encounter: 184 lb (83.5 kg).  Risk Assessment: Allergies: Reviewed. She is allergic to beef-derived products, other, percocet [oxycodone-acetaminophen], and pitocin [oxytocin].  Allergy Precautions: None required Coagulopathies: Reviewed. None identified.  Blood-thinner therapy: None at this time Active Infection(s): Reviewed. None identified. Valerie Welch is afebrile  Site Confirmation: Ms. Johndrow was asked to confirm the procedure and laterality before marking the site Procedure checklist: Completed Consent: Before the procedure and under the influence of no sedative(s), amnesic(s), or anxiolytics, the patient was informed of the treatment options, risks and possible complications. To fulfill our ethical and legal obligations, as recommended by the American Medical Association's Code of Ethics, I have informed the patient of my clinical impression; the nature and purpose of the treatment or procedure; the risks, benefits, and possible complications of the intervention; the alternatives, including doing nothing; the risk(s) and benefit(s) of the alternative treatment(s) or  procedure(s); and the risk(s) and benefit(s) of doing nothing. The patient was provided information about the general risks and possible complications associated with the procedure. These may include, but are not limited to: failure to achieve desired goals, infection, bleeding, organ or nerve damage, allergic reactions, paralysis, and death.  In addition, the patient was informed of those risks and complications associated to Spine-related procedures, such as failure to decrease pain; infection (i.e.: Meningitis, epidural or intraspinal abscess); bleeding (i.e.: epidural hematoma, subarachnoid hemorrhage, or any other type of intraspinal or peri-dural bleeding); organ or nerve damage (i.e.: Any type of peripheral nerve, nerve root, or spinal cord injury) with subsequent damage to sensory, motor, and/or autonomic systems, resulting in permanent pain, numbness, and/or weakness of one or several areas of the body; allergic reactions; (i.e.: anaphylactic reaction); and/or death. Furthermore, the patient was informed of those risks and complications associated with the medications. These include, but are not limited to: allergic reactions (i.e.: anaphylactic or anaphylactoid reaction(s)); adrenal axis suppression; blood sugar elevation that in diabetics may result in ketoacidosis or comma; water retention that in patients with history of congestive heart failure may result in shortness of breath, pulmonary edema, and decompensation with resultant heart failure; weight gain; swelling or edema; medication-induced neural toxicity; particulate matter embolism and blood vessel occlusion with resultant organ, and/or nervous system infarction; and/or aseptic necrosis of one or more joints. Finally, the patient was informed that Medicine is not an exact science; therefore, there is also the possibility of unforeseen or unpredictable risks and/or possible complications that may result in a catastrophic outcome. The patient  indicated having understood very clearly. We have given the patient no guarantees and we have made no promises. Enough time was given to the patient to ask questions, all of which were answered to the patient's satisfaction. Ms. Whittington has indicated that she wanted to continue with the procedure. Attestation: I, the ordering provider, attest that I have discussed with the patient the benefits, risks, side-effects, alternatives, likelihood of achieving goals, and potential problems during recovery for the procedure that I have provided informed consent. Date  Time: 11/27/2021  9:13 AM  Pre-Procedure Preparation:  Monitoring: As per clinic protocol. Respiration, ETCO2, SpO2, BP, heart rate and rhythm monitor placed and checked for adequate function Safety Precautions: Patient was assessed for positional comfort and pressure points before starting the procedure. Time-out: I initiated and conducted the "Time-out" before starting the procedure, as per protocol. The patient was asked to participate by confirming the accuracy of the "Time Out" information. Verification of the correct person, site, and procedure were performed and confirmed by me, the nursing staff, and the patient. "Time-out" conducted as per Joint Commission's Universal Protocol (UP.01.01.01). Time: 1004  Description of Procedure:          Target Area: The interlaminar space, initially targeting the lower laminar border of the superior vertebral body. Approach: Paramedial approach. Area Prepped: Entire Posterior Lumbar Region DuraPrep (Iodine Povacrylex [0.7% available iodine] and Isopropyl Alcohol, 74% w/w) Safety Precautions: Aspiration looking for blood return was conducted prior to all injections. At no point did we inject any substances, as a needle was being advanced. No attempts were made at seeking any paresthesias. Safe injection practices and needle disposal techniques used. Medications properly checked for expiration  dates. SDV (single dose vial) medications used. Description of the Procedure: Protocol guidelines were followed. The procedure needle was introduced through the skin, ipsilateral to the reported pain, and advanced to the target area. Bone was contacted and the needle walked caudad, until the lamina was cleared. The epidural space was identified using "loss-of-resistance technique" with 2-3 ml of PF-NaCl (0.9% NSS), in a 5cc LOR glass syringe.  Vitals:   11/27/21 0915 11/27/21 1005 11/27/21 1009  BP: (!) 155/93 (!) 144/109 (!) 149/109  Pulse: 89    Resp: 16 12 12   Temp: (!) 96.6 F (35.9 C)    TempSrc: Temporal    SpO2: 100% 97% 100%  Weight: 184 lb (83.5 kg)    Height: 5\' 1"  (1.549 m)      Start Time: 1004 hrs. End Time: 1008 hrs.  Materials:  Needle(s) Type: Epidural needle Gauge: 22G Length: 3.5-in Medication(s): Please see orders for medications and dosing details. 6 cc solution made of 3 cc of preservative-free saline, 2 cc of 0.2% ropivacaine, 1 cc of Decadron 10 mg/cc.  Imaging Guidance (Spinal):          Type of Imaging Technique: Fluoroscopy Guidance (Spinal) Indication(s): Assistance in needle guidance and placement for procedures requiring needle placement in or near specific anatomical locations not easily accessible without such assistance. Exposure Time: Please see nurses notes. Contrast: Before injecting any contrast, we confirmed that the patient did not have an allergy to iodine, shellfish, or radiological contrast. Once satisfactory needle placement was completed at the desired level, radiological contrast was injected. Contrast injected under live fluoroscopy. No contrast complications. See chart for type and volume of contrast used. Fluoroscopic Guidance: I was personally present during the use of fluoroscopy. "Tunnel Vision Technique" used to obtain the best possible view of the target area. Parallax error corrected before commencing the procedure.  "Direction-depth-direction" technique used to introduce the needle under continuous pulsed fluoroscopy. Once target was reached, antero-posterior, oblique, and lateral fluoroscopic projection used confirm needle placement in all planes. Images permanently stored in EMR. Interpretation: I personally interpreted the imaging intraoperatively. Adequate needle placement confirmed in multiple planes. Appropriate spread of contrast into desired area was observed. No evidence of afferent or efferent intravascular uptake. No intrathecal or subarachnoid spread observed. Permanent images saved into the patient's record.   Post-operative Assessment:  Post-procedure Vital Signs:  Pulse/HCG Rate: 8976 Temp:  (!) 96.6 F (35.9 C) Resp: 12 BP:  (!) 149/109 SpO2: 100 %  EBL: None  Complications: No immediate post-treatment complications observed by team, or reported by patient.  Note: The patient tolerated the entire procedure well. A repeat set of vitals were taken after the procedure and the patient was kept under observation following institutional policy, for this type of procedure. Post-procedural neurological assessment was performed, showing return to baseline, prior to discharge. The patient was provided with post-procedure discharge instructions, including a section on how to identify potential problems. Should any problems arise concerning this procedure, the patient was given instructions to immediately contact us, at any time, without hesitation. In any case, we plan to contact the patient by telephone for a follow-up status report regarding this interventional procedure.  Comments:  No additional relevant information.  Plan of Care  Orders:  Orders Placed This Encounter  Procedures   [facility] C-ARM 1-60 MIN NO REPORT    Intraoperative interpretation by procedural physician at [facility].    Standing Status:   Standing    Number of Occurrences:   1    Order Specific  Question:   Reason for exam:    Answer:   Assistance in needle guidance and placement for procedures requiring needle placement in or near specific anatomical locations not easily accessible without such assistance.    Medications ordered for procedure: Meds ordered this encounter  Medications   iohexol (OMNIPAQUE) 180 MG/ML injection 10 mL    Must be Myelogram-compatible. If not available, you may substitute with a water-soluble, non-ionic, hypoallergenic, myelogram-compatible radiological contrast medium.  lidocaine (XYLOCAINE) 2 % (with pres) injection 400 mg   sodium chloride flush (NS) 0.9 % injection 2 mL   ropivacaine (PF) 2 mg/mL (0.2%) (NAROPIN) injection 2 mL   dexamethasone (DECADRON) injection 10 mg   Medications administered: We administered iohexol, lidocaine, sodium chloride flush, ropivacaine (PF) 2 mg/mL (0.2%), and dexamethasone.  See the medical record for exact dosing, route, and time of administration.  Follow-up plan:   Return 4-5 weeks, for PP  VV.     Recent Visits Date Type Provider Dept  11/13/21 Office Visit Gillis Santa, MD [facility]  Showing recent visits within past 90 days and meeting all other requirements Today's Visits Date Type Provider Dept  11/27/21 Procedure visit Gillis Santa, MD [facility]  Showing today's visits and meeting all other requirements Future Appointments Date Type Provider Dept  12/26/21 Appointment Gillis Santa, MD [facility]  Showing future appointments within next 90 days and meeting all other requirements Disposition: Discharge home  Discharge (Date  Time): 11/27/2021; 1020 hrs.   Primary Care Physician: Tracie Harrier, MD Location: [facility] Note by: Gillis Santa, MD Date: 11/27/2021; Time: 10:37 AM  Disclaimer:  Medicine is not an exact science. The only guarantee in medicine is that nothing is guaranteed. It is important to note that the decision  to proceed with this intervention was based on the information collected from the patient. The Data and conclusions were drawn from the patient's questionnaire, the interview, and the physical examination. Because the information was provided in large part by the patient, it cannot be guaranteed that it has not been purposely or unconsciously manipulated. Every effort has been made to obtain as much relevant data as possible for this evaluation. It is important to note that the conclusions that lead to this procedure are derived in large part from the available data. Always take into account that the treatment will also be dependent on availability of resources and existing treatment guidelines, considered by other Pain Management Practitioners as being common knowledge and practice, at the time of the intervention. For Medico-Legal purposes, it is also important to point out that variation in procedural techniques and pharmacological choices are the acceptable norm. The indications, contraindications, technique, and results of the above procedure should only be interpreted and judged by a Board-Certified Interventional Pain Specialist with extensive familiarity and expertise in the same exact procedure and technique.

## 2021-11-28 ENCOUNTER — Telehealth: Payer: Self-pay

## 2021-11-28 NOTE — Telephone Encounter (Signed)
Called PP  Denies any needs at this time. Instructed to call if needed. Patient with understanding.

## 2021-12-26 ENCOUNTER — Ambulatory Visit
Payer: BC Managed Care – PPO | Attending: Student in an Organized Health Care Education/Training Program | Admitting: Student in an Organized Health Care Education/Training Program

## 2021-12-26 ENCOUNTER — Other Ambulatory Visit: Payer: Self-pay

## 2021-12-26 ENCOUNTER — Encounter: Payer: Self-pay | Admitting: Student in an Organized Health Care Education/Training Program

## 2021-12-26 VITALS — HR 93 | Temp 96.8°F | Resp 16 | Ht 61.0 in | Wt 184.0 lb

## 2021-12-26 DIAGNOSIS — G894 Chronic pain syndrome: Secondary | ICD-10-CM | POA: Diagnosis present

## 2021-12-26 DIAGNOSIS — G5771 Causalgia of right lower limb: Secondary | ICD-10-CM | POA: Insufficient documentation

## 2021-12-26 DIAGNOSIS — M5416 Radiculopathy, lumbar region: Secondary | ICD-10-CM | POA: Insufficient documentation

## 2021-12-26 NOTE — Progress Notes (Signed)
Safety precautions to be maintained throughout the outpatient stay will include: orient to surroundings, keep bed in low position, maintain call bell within reach at all times, provide assistance with transfer out of bed and ambulation.  

## 2021-12-26 NOTE — Patient Instructions (Signed)
______________________________________________________________________ ° °Preparing for your procedure (without sedation) ° °Procedure appointments are limited to planned procedures: °No Prescription Refills. °No disability issues will be discussed. °No medication changes will be discussed. ° °Instructions: °Oral Intake: Do not eat or drink anything for at least 6 hours prior to your procedure. (Exception: Blood Pressure Medication. See below.) °Transportation: Unless otherwise stated by your physician, you may drive yourself after the procedure. °Blood Pressure Medicine: Do not forget to take your blood pressure medicine with a sip of water the morning of the procedure. If your Diastolic (lower reading)is above 100 mmHg, elective cases will be cancelled/rescheduled. °Blood thinners: These will need to be stopped for procedures. Notify our staff if you are taking any blood thinners. Depending on which one you take, there will be specific instructions on how and when to stop it. °Diabetics on insulin: Notify the staff so that you can be scheduled 1st case in the morning. If your diabetes requires high dose insulin, take only ½ of your normal insulin dose the morning of the procedure and notify the staff that you have done so. °Preventing infections: Shower with an antibacterial soap the morning of your procedure.  °Build-up your immune system: Take 1000 mg of Vitamin C with every meal (3 times a day) the day prior to your procedure. °Antibiotics: Inform the staff if you have a condition or reason that requires you to take antibiotics before dental procedures. °Pregnancy: If you are pregnant, call and cancel the procedure. °Sickness: If you have a cold, fever, or any active infections, call and cancel the procedure. °Arrival: You must be in the facility at least 30 minutes prior to your scheduled procedure. °Children: Do not bring any children with you. °Dress appropriately: Bring dark clothing that you would not mind  if they get stained. °Valuables: Do not bring any jewelry or valuables. ° °Reasons to call and reschedule or cancel your procedure: (Following these recommendations will minimize the risk of a serious complication.) °Surgeries: Avoid having procedures within 2 weeks of any surgery. (Avoid for 2 weeks before or after any surgery). °Flu Shots: Avoid having procedures within 2 weeks of a flu shots or . (Avoid for 2 weeks before or after immunizations). °Barium: Avoid having a procedure within 7-10 days after having had a radiological study involving the use of radiological contrast. (Myelograms, Barium swallow or enema study). °Heart attacks: Avoid any elective procedures or surgeries for the initial 6 months after a "Myocardial Infarction" (Heart Attack). °Blood thinners: It is imperative that you stop these medications before procedures. Let us know if you if you take any blood thinner.  °Infection: Avoid procedures during or within two weeks of an infection (including chest colds or gastrointestinal problems). Symptoms associated with infections include: Localized redness, fever, chills, night sweats or profuse sweating, burning sensation when voiding, cough, congestion, stuffiness, runny nose, sore throat, diarrhea, nausea, vomiting, cold or Flu symptoms, recent or current infections. It is specially important if the infection is over the area that we intend to treat. °Heart and lung problems: Symptoms that may suggest an active cardiopulmonary problem include: cough, chest pain, breathing difficulties or shortness of breath, dizziness, ankle swelling, uncontrolled high or unusually low blood pressure, and/or palpitations. If you are experiencing any of these symptoms, cancel your procedure and contact your primary care physician for an evaluation. ° °Remember:  °Regular Business hours are:  °Monday to Thursday 8:00 AM to 4:00 PM ° °Provider's Schedule: °Francisco Naveira, MD:  °Procedure days: Tuesday and Thursday    7:30 AM to 4:00 PM  Gillis Santa, MD:  Procedure days: Monday and Wednesday 7:30 AM to 4:00 PM ______________________________________________________________________  Epidural Steroid Injection An epidural steroid injection is a shot of steroid medicine and numbing medicine that is given into the space between the spinal cord and the bones of the back (epidural space). The shot helps relieve pain caused by an irritated or swollen nerve root. The amount of pain relief you get from the injection depends on what is causing the nerve to be swollen and irritated, and how long your pain lasts. You are more likely to benefit from this injection if your pain is strong and comes on suddenly rather than if you have had long-term (chronic) pain. Tell a health care provider about: Any allergies you have. All medicines you are taking, including vitamins, herbs, eye drops, creams, and over-the-counter medicines. Any problems you or family members have had with anesthetic medicines. Any blood disorders you have. Any surgeries you have had. Any medical conditions you have. Whether you are pregnant or may be pregnant. What are the risks? Generally, this is a safe procedure. However, problems may occur, including: Headache. Bleeding. Infection. Allergic reaction to medicines. Nerve damage. What happens before the procedure? Staying hydrated Follow instructions from your health care provider about hydration, which may include: Up to 2 hours before the procedure - you may continue to drink clear liquids, such as water, clear fruit juice, black coffee, and plain tea. Eating and drinking restrictions Follow instructions from your health care provider about eating and drinking, which may include: 8 hours before the procedure - stop eating heavy meals or foods, such as meat, fried foods, or fatty foods. 6 hours before the procedure - stop eating light meals or foods, such as toast or cereal. 6 hours before  the procedure - stop drinking milk or drinks that contain milk. 2 hours before the procedure - stop drinking clear liquids. Medicines You may be given medicines to lower anxiety. Ask your health care provider about: Changing or stopping your regular medicines. This is especially important if you are taking diabetes medicines or blood thinners. Taking medicines such as aspirin and ibuprofen. These medicines can thin your blood. Do not take these medicines unless your health care provider tells you to take them. Taking over-the-counter medicines, vitamins, herbs, and supplements. General instructions Ask your health care provider what steps will be taken to prevent infection. Plan to have a responsible adult take you home from the hospital or clinic. If you will be going home right after the procedure, plan to have a responsible adult care for you for the time you are told. This is important. What happens during the procedure? An IV will be inserted into one of your veins. You will be given one or more of the following: A medicine to help you relax (sedative). A medicine to numb the area (local anesthetic). You will be asked to lie on your abdomen or sit. The injection site will be cleaned. A needle will be inserted through your skin into the epidural space. This may cause you some discomfort. An X-ray machine will be used to guide the needle as close as possible to the affected nerve. A steroid medicine and a local anesthetic will be injected into the epidural space. The needle and IV will be removed. A bandage (dressing) will be put over the injection site. The procedure may vary among health care providers and hospitals. What can I expect after the procedure? Your blood  pressure, heart rate, breathing rate, and blood oxygen level will be monitored until you leave the hospital or clinic. Your arm or leg may feel weak or numb for a few hours. The injection site may feel sore. Follow these  instructions at home: Injection site care You may remove the bandage (dressing) after 24 hours. Check your injection site every day for signs of infection. Check for: Redness, swelling, or pain. Fluid or blood. Warmth. Pus or a bad smell. Managing pain, stiffness, and swelling For 24 hours after the procedure: Avoid using heat on the injection site. Do not take baths, swim, or use a hot tub until your health care provider approves. Ask your health care provider if you may take showers. You may only be allowed to take sponge baths. If directed, put ice on the injection site. To do this: Put ice in a plastic bag. Place a towel between your skin and the bag. Leave the ice on for 20 minutes, 2-3 times a day.  Activity If you were given a sedative during the procedure, it can affect you for several hours. Do not drive or operate machinery until your health care provider says that it is safe. Return to your normal activities as told by your health care provider. Ask your health care provider what activities are safe for you. General instructions Take over-the-counter and prescription medicines only as told by your health care provider. Drink enough fluid to keep your urine pale yellow. Keep all follow-up visits as told by your health care provider. This is important. Contact a health care provider if: You have any of these signs of infection: Redness, swelling, or pain around your injection site. Fluid or blood coming from your injection site. Warmth coming from your injection site. Pus or a bad smell coming from your injection site. A fever. You continue to have pain and soreness around the injection site, even after taking over-the-counter pain medicine. You have severe, sudden, or lasting nausea or vomiting. Get help right away if: You have severe pain at the injection site that is not relieved by medicines. You develop a severe headache or a stiff neck. You become sensitive to  light. You have any new numbness or weakness in your legs or arms. You lose control of your bladder or bowel movements. You have trouble breathing. Summary An epidural steroid injection is a shot of steroid medicine and numbing medicine that is given into the epidural space. The shot helps relieve pain caused by an irritated or swollen nerve root. You are more likely to benefit from this injection if your pain is strong and comes on suddenly rather than if you have had chronic pain. This information is not intended to replace advice given to you by your health care provider. Make sure you discuss any questions you have with your health care provider. Document Revised: 04/08/2020 Document Reviewed: 06/22/2019 Elsevier Patient Education  [place].

## 2021-12-26 NOTE — Progress Notes (Signed)
PROVIDER NOTE: Information contained herein reflects review and annotations entered in association with encounter. Interpretation of such information and data should be left to medically-trained personnel. Information provided to patient can be located elsewhere in the medical record under "Patient Instructions". Document created using STT-dictation technology, any transcriptional errors that may result from process are unintentional.    Patient: Valerie Welch  Service Category: E/M  Provider: Gillis Santa, MD  DOB: [birth year]  DOS: 12/26/2021  Specialty: Interventional Pain Management  MRN: [id number]  Setting: Ambulatory outpatient  PCP: Tracie Harrier, MD  Type: Established Patient    Referring Provider: Tracie Harrier, MD  Location: Office  Delivery: Face-to-face     HPI  [name], a 51 y.o. year old female, is here today because of her Chronic pain syndrome [G89.4]. Valerie Welch primary complain today is Back Pain and Knee Pain (left) Last encounter: My last encounter with her was on 11/27/2021. Pertinent problems: Valerie Welch has History of foot surgery; Generalized anxiety disorder; Neuropathic pain of both legs; Complex regional pain syndrome type II of right lower limb; and Neuropathic pain of right foot on their pertinent problem list. Pain Assessment: Severity of Chronic pain is reported as a 4 /10. Location: Back Lower/hips buttocks bilaterally , down back of right leg to above knee. Onset: More than a month ago. Quality: Aching, Constant, Sharp. Timing: Constant. Modifying factor(s): meds, procedures, cream, CBD pain creme. Vitals:  height is '5\' 1"'  (1.549 m) and weight is 184 lb (83.5 kg). Her temperature is 96.8 F (36 C) (abnormal). Her pulse is 93. Her respiration is 16 and oxygen saturation is 100%.   Reason for encounter: post-procedure evaluation and assessment.     Post-procedure evaluation     Procedure:          Anesthesia, Analgesia,  Anxiolysis:  Type: Therapeutic Inter-Laminar Epidural Steroid Injection  #1  Region: Lumbar Level: L4-5 Level. Laterality: Right-Sided         Anesthesia: Local (1-2% Lidocaine)  Anxiolysis: None  Sedation: None  Guidance: Fluoroscopy           Position: Prone with head of the table was raised to facilitate breathing.   Indications: 1. Chronic pain syndrome   2. Lumbar radiculopathy    Pain Score: Pre-procedure: 7 /10 Post-procedure: 7 /10     Effectiveness:  Initial hour after procedure: 50% Subsequent 4-6 hours post-procedure: 75% Analgesia past initial 6 hours: 60-70% for 3 weeks  Ongoing improvement:  Analgesic:  50%, pain returning Function: Somewhat improved ROM: Back to baseline   ROS  Constitutional: Denies any fever or chills Gastrointestinal: No reported hemesis, hematochezia, vomiting, or acute GI distress Musculoskeletal: Denies any acute onset joint swelling, redness, loss of ROM, or weakness Neurological:  RLE pain (dermatomal )  Medication Review  ALPRAZolam, B-12, Bepotastine Besilate, DSS, DULoxetine, Fluticasone-Salmeterol, HYDROcodone-Acetaminophen, Melatonin, NON FORMULARY, azelastine, buPROPion, cetirizine, diclofenac sodium, gabapentin, iron polysaccharides, levothyroxine, montelukast, multivitamin with minerals, omeprazole, polyethylene glycol, rosuvastatin, trolamine salicylate, and vitamin C  History Review  Allergy: Valerie Welch is allergic to beef-derived products, other, percocet [oxycodone-acetaminophen], and pitocin [oxytocin]. [name]  reports no history of drug use. Alcohol:  reports that she does not currently use alcohol. Tobacco:  reports that she has never smoked. She has never used smokeless tobacco. [name]  reports that she has never smoked. She has never used smokeless tobacco. She reports that she does not currently use alcohol. She reports that she does not use drugs. Medical:  has a past  medical history of Anemia, Arthritis, Asthma, Barrett's esophagus, Bursitis, Depression, Fibromyalgia, GERD (gastroesophageal reflux disease), History of hiatal hernia, Hypothyroidism, and Nontoxic uninodular goiter. Surgical: Valerie Welch  has a past surgical history that includes Cesarean section; Cholecystectomy, laparoscopic; Ankle arthroscopy (Right, 09/26/2018); Tenotomy / flexor tendon transfer (Right, 09/26/2018); Arthrodesis metatarsalphalangeal joint (mtpj) (Right, 09/26/2018); Exostectectomy toe (Right, 09/26/2018); Cholecystectomy; Colonoscopy with propofol (N/A, 06/16/2020); Esophagogastroduodenoscopy (egd) with propofol (N/A, 06/16/2020); and Robotic assisted laparoscopic hysterectomy and salpingectomy (N/A, 07/05/2020). Family: family history includes Cancer in her paternal grandfather; Diabetes in her mother; Heart disease in her mother; Hypertension in her maternal grandfather; Stroke in her brother, father, and mother.  Laboratory Chemistry Profile   Renal Lab Results  Component Value Date   BUN 7 07/01/2020   CREATININE 1.00 07/01/2020   GFRAA >60 07/01/2020   GFRNONAA >60 07/01/2020    Hepatic No results found for: AST, ALT, ALBUMIN, ALKPHOS, HCVAB, AMYLASE, LIPASE, AMMONIA  Electrolytes Lab Results  Component Value Date   NA 139 07/01/2020   K 4.5 07/01/2020   CL 108 07/01/2020   CALCIUM 9.2 07/01/2020    Bone No results found for: VD25OH, VD125OH2TOT, AY8472WT2, TC2883DV4, 25OHVITD1, 25OHVITD2, 25OHVITD3, TESTOFREE, TESTOSTERONE  Inflammation (CRP: Acute Phase) (ESR: Chronic Phase) No results found for: CRP, ESRSEDRATE, LATICACIDVEN       Note: Above Lab results reviewed.  Physical Exam  General appearance: Well nourished, well developed, and well hydrated. In no apparent acute distress Mental status: Alert, oriented x 3 (person, place, & time)       Respiratory: No evidence of acute respiratory distress Eyes: PERLA Vitals: Pulse 93    Temp (!) 96.8 F (36 C)     Resp 16    Ht '5\' 1"'  (1.549 m)    Wt 184 lb (83.5 kg)    LMP 09/10/2019    SpO2 100%    BMI 34.77 kg/m  BMI: Estimated body mass index is 34.77 kg/m as calculated from the following:   Height as of this encounter: '5\' 1"'  (1.549 m).   Weight as of this encounter: 184 lb (83.5 kg). Ideal: Ideal body weight: 47.8 kg (105 lb 6.1 oz) Adjusted ideal body weight: 62.1 kg (136 lb 13.2 oz)  Lumbar Spine Area Exam  Skin & Axial Inspection: No masses, redness, or swelling Alignment: Symmetrical Functional ROM: Pain restricted ROM       Stability: No instability detected Muscle Tone/Strength: Functionally intact. No obvious neuro-muscular anomalies detected. Sensory (Neurological): Dermatomal pain pattern right L4-L5 Palpation: No palpable anomalies       Provocative Tests: Hyperextension/rotation test: deferred today       Lumbar quadrant test (Kemp's test): deferred today           Lower Extremity Exam      Side: Right lower extremity   Side: Left lower extremity  Stability: No instability observed           Stability: No instability observed          Skin & Extremity Inspection: Evidence of prior arthroplastic surgery redness, decreased hair growth, cool to touch, swelling   Skin & Extremity Inspection: Skin color, temperature, and hair growth are WNL. No peripheral edema or cyanosis. No masses, redness, swelling, asymmetry, or associated skin lesions. No contractures.  Functional ROM: Pain restricted ROM                   Functional ROM: Unrestricted ROM  Muscle Tone/Strength: Functionally intact. No obvious neuro-muscular anomalies detected.   Muscle Tone/Strength: Functionally intact. No obvious neuro-muscular anomalies detected.  Sensory (Neurological): Neurogenic pain pattern positive straight leg raise test         Sensory (Neurological): Unimpaired        DTR: Patellar: deferred today Achilles: deferred today Plantar: deferred today   DTR: Patellar: deferred  today Achilles: deferred today Plantar: deferred today  Palpation: No palpable anomalies   Palpation: No palpable anomalies      Assessment   Status Diagnosis  Controlled Responding Controlled 1. Chronic pain syndrome   2. Lumbar radiculopathy   3. Complex regional pain syndrome type II of right lower limb      Plan of Care   Patient endorses pain relief by approximately 50 to 60% for 3 weeks after her previous lumbar epidural steroid injection.  Notices improvement in her ability to ambulate as well for those 3 weeks.  Pain is now returning and discussed repeating lumbar ESI #2 and adding additional volume.  Orders:  Orders Placed This Encounter  Procedures   Lumbar Epidural Injection    Standing Status:   Future    Standing Expiration Date:   01/26/2022    Scheduling Instructions:     Right L4/5    Order Specific Question:   Where will this procedure be performed?    Answer:   [facility]   Follow-up plan:   Return in about 1 week (around 01/02/2022) for R L4/5 ESI, without sedation.     Right lower extremity CRPS, lumbar sympathetic nerve block denied by insurance.  Consider spinal cord stimulator trial in future.     Recent Visits Date Type Provider Dept  11/27/21 Procedure visit Valerie Santa, MD [facility]  11/13/21 Office Visit Valerie Santa, MD [facility]  Showing recent visits within past 90 days and meeting all other requirements Today's Visits Date Type Provider Dept  12/26/21 Office Visit Valerie Santa, MD [facility]  Showing today's visits and meeting all other requirements Future Appointments Date Type Provider Dept  01/01/22 Appointment Valerie Santa, MD [facility]  Showing future appointments within next 90 days and meeting all other requirements  I discussed the assessment and treatment plan with the patient. The patient was provided an opportunity to ask questions and all were answered. The patient  agreed with the plan and demonstrated an understanding of the instructions.  Patient advised to call back or seek an in-person evaluation if the symptoms or condition worsens.  Duration of encounter: 39mnutes.  Note by: BGillis Santa MD Date: 12/26/2021; Time: 9:13 AM

## 2022-01-01 ENCOUNTER — Encounter: Payer: Self-pay | Admitting: Student in an Organized Health Care Education/Training Program

## 2022-01-01 ENCOUNTER — Other Ambulatory Visit: Payer: Self-pay

## 2022-01-01 ENCOUNTER — Ambulatory Visit (HOSPITAL_BASED_OUTPATIENT_CLINIC_OR_DEPARTMENT_OTHER): Payer: BC Managed Care – PPO | Admitting: Student in an Organized Health Care Education/Training Program

## 2022-01-01 ENCOUNTER — Ambulatory Visit
Admission: RE | Admit: 2022-01-01 | Discharge: 2022-01-01 | Disposition: A | Discharge status: Home / Self Care | Payer: BC Managed Care – PPO | Source: Ambulatory Visit | Attending: Student in an Organized Health Care Education/Training Program | Admitting: Student in an Organized Health Care Education/Training Program

## 2022-01-01 DIAGNOSIS — M5416 Radiculopathy, lumbar region: Secondary | ICD-10-CM | POA: Diagnosis present

## 2022-01-01 DIAGNOSIS — G894 Chronic pain syndrome: Secondary | ICD-10-CM | POA: Diagnosis present

## 2022-01-01 MED ORDER — SODIUM CHLORIDE (PF) 0.9 % IJ SOLN
INTRAMUSCULAR | Status: AC
  Filled 2022-01-01: qty 10

## 2022-01-01 MED ORDER — SODIUM CHLORIDE 0.9% FLUSH: 2.0000 mL | Freq: Once | INTRAVENOUS | Status: DC

## 2022-01-01 MED ORDER — ROPIVACAINE HCL 2 MG/ML IJ SOLN: 2.0000 mL | Freq: Once | INTRAMUSCULAR | Status: DC

## 2022-01-01 MED ORDER — ROPIVACAINE HCL 2 MG/ML IJ SOLN
INTRAMUSCULAR | Status: AC
  Filled 2022-01-01: qty 20

## 2022-01-01 MED ORDER — LIDOCAINE HCL 2 % IJ SOLN
20.0000 mL | Freq: Once | INTRAMUSCULAR | Status: DC
Start: 2022-01-01 — End: 2022-01-01
  Filled 2022-01-01: qty 20

## 2022-01-01 MED ORDER — DEXAMETHASONE SODIUM PHOSPHATE 10 MG/ML IJ SOLN
10.0000 mg | Freq: Once | INTRAMUSCULAR | Status: DC
  Filled 2022-01-01: qty 1

## 2022-01-01 MED ORDER — IOHEXOL 180 MG/ML  SOLN
10.0000 mL | Freq: Once | INTRAMUSCULAR | Status: DC
Start: 2022-01-01 — End: 2022-01-01

## 2022-01-01 NOTE — Progress Notes (Signed)
PROVIDER NOTE: Information contained herein reflects review and annotations entered in association with encounter. Interpretation of such information and data should be left to medically-trained personnel. Information provided to patient can be located elsewhere in the medical record under "Patient Instructions". Document created using STT-dictation technology, any transcriptional errors that may result from process are unintentional.    Patient: Valerie Welch  Service Category: Procedure Provider: Gillis Santa, MD DOB: 09/06/1971 DOS: 01/01/2022 Location: [facility] MRN: [id number] Setting: Ambulatory - outpatient Referring Provider: Gillis Santa, MD Type: Established Patient Specialty: Interventional Pain Management PCP: Tracie Harrier, MD  Primary Reason for Visit: Interventional Pain Management Treatment. CC: Hip Pain (Right side)    Procedure:          Anesthesia, Analgesia, Anxiolysis:  Type: Therapeutic Inter-Laminar Epidural Steroid Injection  #2  Region: Lumbar Level: L4-5 Level. Laterality: Right-Sided         Anesthesia: Local (1-2% Lidocaine)  Anxiolysis: None  Sedation: None  Guidance: Fluoroscopy           Position: Prone with head of the table was raised to facilitate breathing.   Indications: 1. Chronic pain syndrome   2. Lumbar radiculopathy    Pain Score: Pre-procedure: 6 /10 Post-procedure: 6  (patient moving/walking)/10    Pre-op H&P Assessment:  Valerie Welch is a 51 y.o. (year old), female patient, seen today for interventional treatment. She  has a past surgical history that includes Cesarean section; Cholecystectomy, laparoscopic; Ankle arthroscopy (Right, 09/26/2018); Tenotomy / flexor tendon transfer (Right, 09/26/2018); Arthrodesis metatarsalphalangeal joint (mtpj) (Right, 09/26/2018); Exostectectomy toe (Right, 09/26/2018); Cholecystectomy; Colonoscopy with propofol (N/A, 06/16/2020); Esophagogastroduodenoscopy (egd) with propofol  (N/A, 06/16/2020); and Robotic assisted laparoscopic hysterectomy and salpingectomy (N/A, 07/05/2020). Valerie Welch has a current medication list which includes the following prescription(s): alprazolam, vitamin c, azelastine, bepreve, bupropion, bupropion, cefuroxime, cetirizine, b-12, diclofenac sodium, dss, duloxetine, fluticasone-salmeterol, gabapentin, hydrocodone-acetaminophen, iron polysaccharides, levothyroxine, montelukast, multivitamin with minerals, NON FORMULARY, omeprazole, polyethylene glycol, rosuvastatin, trolamine salicylate, and melatonin, and the following Facility-Administered Medications: dexamethasone, iohexol, lidocaine, ropivacaine (pf) 2 mg/ml (0.2%), and sodium chloride flush. Her primarily concern today is the Hip Pain (Right side)   Initial Vital Signs:  Pulse/HCG Rate: 71 (NSR)  Temp:  (!) 97.2 F (36.2 C) Resp: 16 BP:  (!) 147/99 SpO2: 100 %  BMI: Estimated body mass index is 34.77 kg/m as calculated from the following:   Height as of this encounter: 5\' 1"  (1.549 m).   Weight as of this encounter: 184 lb (83.5 kg).  Risk Assessment: Allergies: Reviewed. She is allergic to beef-derived products, other, percocet [oxycodone-acetaminophen], and pitocin [oxytocin].  Allergy Precautions: None required Coagulopathies: Reviewed. None identified.  Blood-thinner therapy: None at this time Active Infection(s): Reviewed. None identified. Valerie Welch is afebrile  Site Confirmation: Valerie Welch was asked to confirm the procedure and laterality before marking the site Procedure checklist: Completed Consent: Before the procedure and under the influence of no sedative(s), amnesic(s), or anxiolytics, the patient was informed of the treatment options, risks and possible complications. To fulfill our ethical and legal obligations, as recommended by the American Medical Association's Code of Ethics, I have informed the patient of my clinical impression; the nature  and purpose of the treatment or procedure; the risks, benefits, and possible complications of the intervention; the alternatives, including doing nothing; the risk(s) and benefit(s) of the alternative treatment(s) or procedure(s); and the risk(s) and benefit(s) of doing nothing. The patient was provided information about the general risks and possible complications associated  with the procedure. These may include, but are not limited to: failure to achieve desired goals, infection, bleeding, organ or nerve damage, allergic reactions, paralysis, and death. In addition, the patient was informed of those risks and complications associated to Spine-related procedures, such as failure to decrease pain; infection (i.e.: Meningitis, epidural or intraspinal abscess); bleeding (i.e.: epidural hematoma, subarachnoid hemorrhage, or any other type of intraspinal or peri-dural bleeding); organ or nerve damage (i.e.: Any type of peripheral nerve, nerve root, or spinal cord injury) with subsequent damage to sensory, motor, and/or autonomic systems, resulting in permanent pain, numbness, and/or weakness of one or several areas of the body; allergic reactions; (i.e.: anaphylactic reaction); and/or death. Furthermore, the patient was informed of those risks and complications associated with the medications. These include, but are not limited to: allergic reactions (i.e.: anaphylactic or anaphylactoid reaction(s)); adrenal axis suppression; blood sugar elevation that in diabetics may result in ketoacidosis or comma; water retention that in patients with history of congestive heart failure may result in shortness of breath, pulmonary edema, and decompensation with resultant heart failure; weight gain; swelling or edema; medication-induced neural toxicity; particulate matter embolism and blood vessel occlusion with resultant organ, and/or nervous system infarction; and/or aseptic necrosis of one or more joints. Finally, the patient  was informed that Medicine is not an exact science; therefore, there is also the possibility of unforeseen or unpredictable risks and/or possible complications that may result in a catastrophic outcome. The patient indicated having understood very clearly. We have given the patient no guarantees and we have made no promises. Enough time was given to the patient to ask questions, all of which were answered to the patient's satisfaction. Valerie Welch has indicated that she wanted to continue with the procedure. Attestation: I, the ordering provider, attest that I have discussed with the patient the benefits, risks, side-effects, alternatives, likelihood of achieving goals, and potential problems during recovery for the procedure that I have provided informed consent. Date   Time: 01/01/2022 11:06 AM  Pre-Procedure Preparation:  Monitoring: As per clinic protocol. Respiration, ETCO2, SpO2, BP, heart rate and rhythm monitor placed and checked for adequate function Safety Precautions: Patient was assessed for positional comfort and pressure points before starting the procedure. Time-out: I initiated and conducted the "Time-out" before starting the procedure, as per protocol. The patient was asked to participate by confirming the accuracy of the "Time Out" information. Verification of the correct person, site, and procedure were performed and confirmed by me, the nursing staff, and the patient. "Time-out" conducted as per Joint Commission's Universal Protocol (UP.01.01.01). Time: 1156  Description of Procedure:          Target Area: The interlaminar space, initially targeting the lower laminar border of the superior vertebral body. Approach: Paramedial approach. Area Prepped: Entire Posterior Lumbar Region DuraPrep (Iodine Povacrylex [0.7% available iodine] and Isopropyl Alcohol, 74% w/w) Safety Precautions: Aspiration looking for blood return was conducted prior to all injections. At no point did we  inject any substances, as a needle was being advanced. No attempts were made at seeking any paresthesias. Safe injection practices and needle disposal techniques used. Medications properly checked for expiration dates. SDV (single dose vial) medications used. Description of the Procedure: Protocol guidelines were followed. The procedure needle was introduced through the skin, ipsilateral to the reported pain, and advanced to the target area. Bone was contacted and the needle walked caudad, until the lamina was cleared. The epidural space was identified using loss-of-resistance technique with 2-3 ml of PF-NaCl (  0.9% NSS), in a 5cc LOR glass syringe.  Vitals:   01/01/22 1115 01/01/22 1150 01/01/22 1200 01/01/22 1208  BP: (!) 147/99 (!) 165/103 (!) 149/109 (!) 151/105  Pulse: 75 74 76 75  Resp: 16 14 12 13   Temp: (!) 97.2 F (36.2 C)     TempSrc: Temporal     SpO2: 100% 99% 100% 100%  Weight: 184 lb (83.5 kg)     Height: 5\' 1"  (1.549 m)        Start Time: 1156 hrs. End Time: 1205 hrs.  Materials:  Needle(s) Type: Epidural needle Gauge: 22G Length: 3.5-in Medication(s): Please see orders for medications and dosing details.  6 cc solution made of 3 cc of preservative-free saline, 2 cc of 0.2% ropivacaine, 1 cc of Decadron 10 mg/cc.  Imaging Guidance (Spinal):          Type of Imaging Technique: Fluoroscopy Guidance (Spinal) Indication(s): Assistance in needle guidance and placement for procedures requiring needle placement in or near specific anatomical locations not easily accessible without such assistance. Exposure Time: Please see nurses notes. Contrast: Before injecting any contrast, we confirmed that the patient did not have an allergy to iodine, shellfish, or radiological contrast. Once satisfactory needle placement was completed at the desired level, radiological contrast was injected. Contrast injected under live fluoroscopy. No contrast complications. See chart for type and  volume of contrast used. Fluoroscopic Guidance: I was personally present during the use of fluoroscopy. "Tunnel Vision Technique" used to obtain the best possible view of the target area. Parallax error corrected before commencing the procedure. "Direction-depth-direction" technique used to introduce the needle under continuous pulsed fluoroscopy. Once target was reached, antero-posterior, oblique, and lateral fluoroscopic projection used confirm needle placement in all planes. Images permanently stored in EMR. Interpretation: I personally interpreted the imaging intraoperatively. Adequate needle placement confirmed in multiple planes. Appropriate spread of contrast into desired area was observed. No evidence of afferent or efferent intravascular uptake. No intrathecal or subarachnoid spread observed. Permanent images saved into the patient's record.   Post-operative Assessment:  Post-procedure Vital Signs:  Pulse/HCG Rate: 75 (nsr)  Temp:  (!) 97.2 F (36.2 C) Resp: 13 BP:  (!) 151/105 SpO2: 100 %  EBL: None  Complications: No immediate post-treatment complications observed by team, or reported by patient.  Note: The patient tolerated the entire procedure well. A repeat set of vitals were taken after the procedure and the patient was kept under observation following institutional policy, for this type of procedure. Post-procedural neurological assessment was performed, showing return to baseline, prior to discharge. The patient was provided with post-procedure discharge instructions, including a section on how to identify potential problems. Should any problems arise concerning this procedure, the patient was given instructions to immediately contact us, at any time, without hesitation. In any case, we plan to contact the patient by telephone for a follow-up status report regarding this interventional procedure.  Comments:  No additional relevant information.  Plan of Care  Orders:  Orders  Placed This Encounter  Procedures   DG PAIN CLINIC C-ARM 1-60 MIN NO REPORT    Intraoperative interpretation by procedural physician at [place].    Standing Status:   Standing    Number of Occurrences:   1    Order Specific Question:   Reason for exam:    Answer:   Assistance in needle guidance and placement for procedures requiring needle placement in or near specific anatomical locations not easily accessible without such assistance.  Medications ordered for procedure: Meds ordered this encounter  Medications   iohexol (OMNIPAQUE) 180 MG/ML injection 10 mL    Must be Myelogram-compatible. If not available, you may substitute with a water-soluble, non-ionic, hypoallergenic, myelogram-compatible radiological contrast medium.   lidocaine (XYLOCAINE) 2 % (with pres) injection 400 mg   sodium chloride flush (NS) 0.9 % injection 2 mL   ropivacaine (PF) 2 mg/mL (0.2%) (NAROPIN) injection 2 mL   dexamethasone (DECADRON) injection 10 mg   Medications administered: Beatrix C. [name] had no medications administered during this visit.  See the medical record for exact dosing, route, and time of administration.  Follow-up plan:   Return in about 4 weeks (around 01/29/2022) for Post Procedure Evaluation, virtual.     Recent Visits Date Type Provider Dept  12/26/21 Office Visit Gillis Santa, MD [facility]  11/27/21 Procedure visit Gillis Santa, MD [facility]  11/13/21 Office Visit Gillis Santa, MD [facility]  Showing recent visits within past 90 days and meeting all other requirements Today's Visits Date Type Provider Dept  01/01/22 Procedure visit Gillis Santa, MD [facility]  Showing today's visits and meeting all other requirements Future Appointments Date Type Provider Dept  01/29/22 Appointment Gillis Santa, MD [facility]  Showing future appointments within next 90 days and meeting all other  requirements  Disposition: Discharge home  Discharge (Date   Time): 01/01/2022; 1211 hrs.   Primary Care Physician: Tracie Harrier, MD Location: [facility] Note by: Gillis Santa, MD Date: 01/01/2022; Time: 1:01 PM  Disclaimer:  Medicine is not an exact science. The only guarantee in medicine is that nothing is guaranteed. It is important to note that the decision to proceed with this intervention was based on the information collected from the patient. The Data and conclusions were drawn from the patient's questionnaire, the interview, and the physical examination. Because the information was provided in large part by the patient, it cannot be guaranteed that it has not been purposely or unconsciously manipulated. Every effort has been made to obtain as much relevant data as possible for this evaluation. It is important to note that the conclusions that lead to this procedure are derived in large part from the available data. Always take into account that the treatment will also be dependent on availability of resources and existing treatment guidelines, considered by other Pain Management Practitioners as being common knowledge and practice, at the time of the intervention. For Medico-Legal purposes, it is also important to point out that variation in procedural techniques and pharmacological choices are the acceptable norm. The indications, contraindications, technique, and results of the above procedure should only be interpreted and judged by a Board-Certified Interventional Pain Specialist with extensive familiarity and expertise in the same exact procedure and technique.

## 2022-01-01 NOTE — Progress Notes (Signed)
Safety precautions to be maintained throughout the outpatient stay will include: orient to surroundings, keep bed in low position, maintain call bell within reach at all times, provide assistance with transfer out of bed and ambulation.  

## 2022-01-01 NOTE — Patient Instructions (Signed)

## 2022-01-02 ENCOUNTER — Telehealth: Payer: Self-pay | Admitting: *Deleted

## 2022-01-02 NOTE — Telephone Encounter (Signed)
No problems post procedure. 

## 2022-01-29 ENCOUNTER — Ambulatory Visit
Payer: BC Managed Care – PPO | Attending: Student in an Organized Health Care Education/Training Program | Admitting: Student in an Organized Health Care Education/Training Program

## 2022-01-29 ENCOUNTER — Other Ambulatory Visit: Payer: Self-pay

## 2022-01-29 ENCOUNTER — Encounter: Payer: Self-pay | Admitting: Student in an Organized Health Care Education/Training Program

## 2022-01-29 DIAGNOSIS — G5771 Causalgia of right lower limb: Secondary | ICD-10-CM | POA: Diagnosis not present

## 2022-01-29 DIAGNOSIS — M5416 Radiculopathy, lumbar region: Secondary | ICD-10-CM | POA: Diagnosis not present

## 2022-01-29 DIAGNOSIS — G894 Chronic pain syndrome: Secondary | ICD-10-CM

## 2022-01-29 DIAGNOSIS — M792 Neuralgia and neuritis, unspecified: Secondary | ICD-10-CM

## 2022-01-29 NOTE — Progress Notes (Signed)
Patient: Valerie Welch  Service Category: E/M  Provider: Gillis Santa, MD  DOB: [birth year]  DOS: 01/29/2022  Location: Office  MRN: [id number]  Setting: Ambulatory outpatient  Referring Provider: Tracie Harrier, MD  Type: Established Patient  Specialty: Interventional Pain Management  PCP: Tracie Harrier, MD  Location: Remote location  Delivery: TeleHealth     Virtual Encounter - Pain Management PROVIDER NOTE: Information contained herein reflects review and annotations entered in association with encounter. Interpretation of such information and data should be left to medically-trained personnel. Information provided to patient can be located elsewhere in the medical record under "Patient Instructions". Document created using STT-dictation technology, any transcriptional errors that may result from process are unintentional.    Contact & Pharmacy Preferred: [phone] Home: [phone] (home) Mobile: [phone] (mobile) E-mail: [email or web address]  [facility], [place] [address] [place] [address] Phone: [phone] Fax: [phone]   Pre-screening  Valerie Welch offered "in-person" vs "virtual" encounter. She indicated preferring virtual for this encounter.   Reason COVID-19*   Social distancing based on CDC and AMA recommendations.   I contacted ROSABELL GEYER on 01/29/2022 via telephone.      I clearly identified myself as Gillis Santa, MD. I verified that I was speaking with the correct person using two identifiers (Name: Valerie Welch, and date of birth: [birth year]).  Consent I sought verbal advanced consent from Elinor Dodge for virtual visit interactions. I informed Valerie Welch of possible security and privacy concerns, risks, and limitations associated with providing "not-in-person" medical evaluation and management services. I also informed Valerie Welch of the availability of  "in-person" appointments. Finally, I informed her that there would be a charge for the virtual visit and that she could be  personally, fully or partially, financially responsible for it. Ms. Silman expressed understanding and agreed to proceed.   Historic Elements   Valerie Welch is a 51 y.o. year old, female patient evaluated today after our last contact on 01/01/2022. Valerie Welch  has a past medical history of Anemia, Arthritis, Asthma, Barrett's esophagus, Bursitis, Depression, Fibromyalgia, GERD (gastroesophageal reflux disease), History of hiatal hernia, Hypothyroidism, and Nontoxic uninodular goiter. She also  has a past surgical history that includes Cesarean section; Cholecystectomy, laparoscopic; Ankle arthroscopy (Right, 09/26/2018); Tenotomy / flexor tendon transfer (Right, 09/26/2018); Arthrodesis metatarsalphalangeal joint (mtpj) (Right, 09/26/2018); Exostectectomy toe (Right, 09/26/2018); Cholecystectomy; Colonoscopy with propofol (N/A, 06/16/2020); Esophagogastroduodenoscopy (egd) with propofol (N/A, 06/16/2020); and Robotic assisted laparoscopic hysterectomy and salpingectomy (N/A, 07/05/2020). Valerie Welch has a current medication list which includes the following prescription(s): alprazolam, vitamin c, azelastine, bepreve, bupropion, bupropion, cefuroxime, cetirizine, b-12, diclofenac sodium, dss, duloxetine, fluticasone-salmeterol, gabapentin, hydrocodone-acetaminophen, iron polysaccharides, levothyroxine, melatonin, montelukast, multivitamin with minerals, NON FORMULARY, omeprazole, polyethylene glycol, rosuvastatin, and trolamine salicylate. She  reports that she has never smoked. She has never used smokeless tobacco. She reports that she does not currently use alcohol. She reports that she does not use drugs. [name] is allergic to beef-derived products, other, percocet [oxycodone-acetaminophen], and pitocin [oxytocin].   HPI  Today, she is being contacted  for a post-procedure assessment.   Post-procedure evaluation     Procedure:          Anesthesia, Analgesia, Anxiolysis:  Type: Therapeutic Inter-Laminar Epidural Steroid Injection  #2  Region: Lumbar Level: L4-5 Level. Laterality: Right-Sided         Anesthesia: Local (1-2% Lidocaine)  Anxiolysis: None  Sedation: None  Guidance: Fluoroscopy  Position: Prone with head of the table was raised to facilitate breathing.   Indications: 1. Chronic pain syndrome   2. Lumbar radiculopathy    Pain Score: Pre-procedure: 6 /10 Post-procedure: 6  (patient moving/walking)/10     Effectiveness:  Initial hour after procedure: 75 %  Subsequent 4-6 hours post-procedure: 75 %  Analgesia past initial 6 hours: 50 % (ongoing)  Ongoing improvement:  Analgesic:  50% however pain is returning Function: Somewhat improved    Laboratory Chemistry Profile   Renal Lab Results  Component Value Date   BUN 7 07/01/2020   CREATININE 1.00 07/01/2020   GFRAA >60 07/01/2020   GFRNONAA >60 07/01/2020    Hepatic No results found for: AST, ALT, ALBUMIN, ALKPHOS, HCVAB, AMYLASE, LIPASE, AMMONIA  Electrolytes Lab Results  Component Value Date   NA 139 07/01/2020   K 4.5 07/01/2020   CL 108 07/01/2020   CALCIUM 9.2 07/01/2020    Bone No results found for: VD25OH, VD125OH2TOT, [id number], ER7408XK4, 25OHVITD1, 25OHVITD2, 25OHVITD3, TESTOFREE, TESTOSTERONE  Inflammation (CRP: Acute Phase) (ESR: Chronic Phase) No results found for: CRP, ESRSEDRATE, LATICACIDVEN       Note: Above Lab results reviewed.    Assessment  The primary encounter diagnosis was Lumbar radiculopathy. Diagnoses of Complex regional pain syndrome type II of right lower limb, Neuropathic pain of right foot, and Chronic pain syndrome were also pertinent to this visit.  Plan of Care    Valerie Welch has a current medication list which includes the following long-term medication(s): azelastine, cetirizine,  duloxetine, gabapentin, iron polysaccharides, levothyroxine, montelukast, and omeprazole.  Pharmacotherapy (Medications Ordered): No orders of the defined types were placed in this encounter.  Orders:  Orders Placed This Encounter  Procedures   Lumbar Epidural Injection    Standing Status:   Future    Standing Expiration Date:   02/26/2022    Scheduling Instructions:     Procedure: Interlaminar Lumbar Epidural Steroid injection (LESI)            Laterality: Midline     Timeframe: ASAA    Order Specific Question:   Where will this procedure be performed?    Answer:   [facility]   Follow-up plan:   Return in about 4 weeks (around 02/26/2022) for R L4-5 ESI , in clinic NS.     Right lower extremity CRPS, lumbar sympathetic nerve block denied by insurance.  Right L4-5 ESI #2 01/01/22. Consider spinal cord stimulator trial in future.      Recent Visits Date Type Provider Dept  01/01/22 Procedure visit Gillis Santa, MD [facility]  12/26/21 Office Visit Gillis Santa, MD [facility]  11/27/21 Procedure visit Gillis Santa, MD [facility]  11/13/21 Office Visit Gillis Santa, MD [facility]  Showing recent visits within past 90 days and meeting all other requirements Today's Visits Date Type Provider Dept  01/29/22 Office Visit Gillis Santa, MD [facility]  Showing today's visits and meeting all other requirements Future Appointments No visits were found meeting these conditions. Showing future appointments within next 90 days and meeting all other requirements  I discussed the assessment and treatment plan with the patient. The patient was provided an opportunity to ask questions and all were answered. The patient agreed with the plan and demonstrated an understanding of the instructions.  Patient advised to call back or seek an in-person evaluation if the symptoms or condition worsens.  Duration of encounter: 30  minutes.  Note by:  Gillis Santa, MD Date: 01/29/2022; Time: 2:32 PM

## 2022-02-06 ENCOUNTER — Other Ambulatory Visit: Payer: Self-pay | Admitting: Obstetrics & Gynecology

## 2022-02-06 DIAGNOSIS — Z1231 Encounter for screening mammogram for malignant neoplasm of breast: Secondary | ICD-10-CM

## 2022-02-21 ENCOUNTER — Ambulatory Visit
Admission: RE | Admit: 2022-02-21 | Discharge: 2022-02-21 | Disposition: A | Discharge status: Home / Self Care | Payer: BC Managed Care – PPO | Source: Ambulatory Visit | Attending: Obstetrics & Gynecology | Admitting: Obstetrics & Gynecology

## 2022-02-21 DIAGNOSIS — Z1231 Encounter for screening mammogram for malignant neoplasm of breast: Secondary | ICD-10-CM

## 2022-02-23 ENCOUNTER — Other Ambulatory Visit: Payer: Self-pay | Admitting: Obstetrics & Gynecology

## 2022-02-23 DIAGNOSIS — R928 Other abnormal and inconclusive findings on diagnostic imaging of breast: Secondary | ICD-10-CM

## 2022-02-28 ENCOUNTER — Other Ambulatory Visit: Payer: Self-pay

## 2022-02-28 ENCOUNTER — Encounter: Payer: Self-pay | Admitting: Student in an Organized Health Care Education/Training Program

## 2022-02-28 ENCOUNTER — Ambulatory Visit
Admission: RE | Admit: 2022-02-28 | Discharge: 2022-02-28 | Disposition: A | Discharge status: Home / Self Care | Payer: BC Managed Care – PPO | Source: Ambulatory Visit | Attending: Student in an Organized Health Care Education/Training Program | Admitting: Student in an Organized Health Care Education/Training Program

## 2022-02-28 ENCOUNTER — Ambulatory Visit (HOSPITAL_BASED_OUTPATIENT_CLINIC_OR_DEPARTMENT_OTHER): Payer: BC Managed Care – PPO | Admitting: Student in an Organized Health Care Education/Training Program

## 2022-02-28 DIAGNOSIS — M5416 Radiculopathy, lumbar region: Secondary | ICD-10-CM | POA: Insufficient documentation

## 2022-02-28 DIAGNOSIS — G894 Chronic pain syndrome: Secondary | ICD-10-CM | POA: Insufficient documentation

## 2022-02-28 MED ORDER — LIDOCAINE HCL (PF) 2 % IJ SOLN
INTRAMUSCULAR | Status: AC
  Filled 2022-02-28: qty 10

## 2022-02-28 MED ORDER — ROPIVACAINE HCL 2 MG/ML IJ SOLN
2.0000 mL | Freq: Once | INTRAMUSCULAR | Status: AC
  Administered 2022-02-28: 2 mL via EPIDURAL

## 2022-02-28 MED ORDER — DEXAMETHASONE SODIUM PHOSPHATE 10 MG/ML IJ SOLN
INTRAMUSCULAR | Status: AC
  Filled 2022-02-28: qty 1

## 2022-02-28 MED ORDER — SODIUM CHLORIDE (PF) 0.9 % IJ SOLN
INTRAMUSCULAR | Status: AC
  Filled 2022-02-28: qty 10

## 2022-02-28 MED ORDER — DEXAMETHASONE SODIUM PHOSPHATE 10 MG/ML IJ SOLN
10.0000 mg | Freq: Once | INTRAMUSCULAR | Status: AC
  Administered 2022-02-28: 10 mg

## 2022-02-28 MED ORDER — IOHEXOL 180 MG/ML  SOLN
10.0000 mL | Freq: Once | INTRAMUSCULAR | Status: AC
  Administered 2022-02-28: 5 mL via EPIDURAL

## 2022-02-28 MED ORDER — ROPIVACAINE HCL 2 MG/ML IJ SOLN
INTRAMUSCULAR | Status: AC
  Filled 2022-02-28: qty 20

## 2022-02-28 MED ORDER — LIDOCAINE HCL 2 % IJ SOLN
20.0000 mL | Freq: Once | INTRAMUSCULAR | Status: AC
  Administered 2022-02-28: 200 mg

## 2022-02-28 MED ORDER — SODIUM CHLORIDE 0.9% FLUSH
2.0000 mL | Freq: Once | INTRAVENOUS | Status: AC
  Administered 2022-02-28: 2 mL

## 2022-02-28 NOTE — Progress Notes (Signed)
PROVIDER NOTE: Information contained herein reflects review and annotations entered in association with encounter. Interpretation of such information and data should be left to medically-trained personnel. Information provided to patient can be located elsewhere in the medical record under "Patient Instructions". Document created using STT-dictation technology, any transcriptional errors that may result from process are unintentional.    Patient: Valerie Welch  Service Category: Procedure Provider: Gillis Santa, MD DOB: 1971-09-10 DOS: 02/28/2022 Location: [facility] MRN: [id number] Setting: Ambulatory - outpatient Referring Provider: Gillis Santa, MD Type: Established Patient Specialty: Interventional Pain Management PCP: Tracie Harrier, MD  Primary Reason for Visit: Interventional Pain Management Treatment. CC: Back Pain (low)    Procedure:          Anesthesia, Analgesia, Anxiolysis:  Type: Therapeutic Inter-Laminar Epidural Steroid Injection  #3 (#1: 11/27/21, #2 01/01/22)  Region: Lumbar Level: L4-5 Level. Laterality: Right-Sided         Anesthesia: Local (1-2% Lidocaine)  Anxiolysis: None  Sedation: None  Guidance: Fluoroscopy           Position: Prone with head of the table was raised to facilitate breathing.   Indications: 1. Lumbar radiculopathy   2. Chronic pain syndrome    Pain Score: Pre-procedure: 6 /10 Post-procedure: 6 /10    Pre-op H&P Assessment:  Valerie Welch is a 51 y.o. (year old), female patient, seen today for interventional treatment. She  has a past surgical history that includes Cesarean section; Cholecystectomy, laparoscopic; Ankle arthroscopy (Right, 09/26/2018); Tenotomy / flexor tendon transfer (Right, 09/26/2018); Arthrodesis metatarsalphalangeal joint (mtpj) (Right, 09/26/2018); Exostectectomy toe (Right, 09/26/2018); Cholecystectomy; Colonoscopy with propofol (N/A, 06/16/2020); Esophagogastroduodenoscopy (egd) with propofol (N/A,  06/16/2020); and Robotic assisted laparoscopic hysterectomy and salpingectomy (N/A, 07/05/2020). Ms. Mast has a current medication list which includes the following prescription(s): alprazolam, vitamin c, azelastine, bepreve, bupropion, bupropion, cefuroxime, cetirizine, b-12, diclofenac sodium, dss, duloxetine, fluticasone-salmeterol, gabapentin, hydrocodone-acetaminophen, iron polysaccharides, levothyroxine, montelukast, multivitamin with minerals, NON FORMULARY, omeprazole, polyethylene glycol, rosuvastatin, trolamine salicylate, and melatonin. Her primarily concern today is the Back Pain (low)   Initial Vital Signs:  Pulse/HCG Rate: 92ECG Heart Rate: 92 Temp:  98.1 F (36.7 C) Resp: 16 BP:  (!) 157/104 SpO2: 96 %  BMI: Estimated body mass index is 34.77 kg/m as calculated from the following:   Height as of this encounter: '5\' 1"'$  (1.549 m).   Weight as of this encounter: 184 lb (83.5 kg).  Risk Assessment: Allergies: Reviewed. She is allergic to beef-derived products, other, percocet [oxycodone-acetaminophen], and pitocin [oxytocin].  Allergy Precautions: None required Coagulopathies: Reviewed. None identified.  Blood-thinner therapy: None at this time Active Infection(s): Reviewed. None identified. Valerie Welch is afebrile  Site Confirmation: Valerie Welch was asked to confirm the procedure and laterality before marking the site Procedure checklist: Completed Consent: Before the procedure and under the influence of no sedative(s), amnesic(s), or anxiolytics, the patient was informed of the treatment options, risks and possible complications. To fulfill our ethical and legal obligations, as recommended by the American Medical Association's Code of Ethics, I have informed the patient of my clinical impression; the nature and purpose of the treatment or procedure; the risks, benefits, and possible complications of the intervention; the alternatives, including doing nothing;  the risk(s) and benefit(s) of the alternative treatment(s) or procedure(s); and the risk(s) and benefit(s) of doing nothing. The patient was provided information about the general risks and possible complications associated with the procedure. These may include, but are not limited to: failure to achieve desired goals, infection,  bleeding, organ or nerve damage, allergic reactions, paralysis, and death. In addition, the patient was informed of those risks and complications associated to Spine-related procedures, such as failure to decrease pain; infection (i.e.: Meningitis, epidural or intraspinal abscess); bleeding (i.e.: epidural hematoma, subarachnoid hemorrhage, or any other type of intraspinal or peri-dural bleeding); organ or nerve damage (i.e.: Any type of peripheral nerve, nerve root, or spinal cord injury) with subsequent damage to sensory, motor, and/or autonomic systems, resulting in permanent pain, numbness, and/or weakness of one or several areas of the body; allergic reactions; (i.e.: anaphylactic reaction); and/or death. Furthermore, the patient was informed of those risks and complications associated with the medications. These include, but are not limited to: allergic reactions (i.e.: anaphylactic or anaphylactoid reaction(s)); adrenal axis suppression; blood sugar elevation that in diabetics may result in ketoacidosis or comma; water retention that in patients with history of congestive heart failure may result in shortness of breath, pulmonary edema, and decompensation with resultant heart failure; weight gain; swelling or edema; medication-induced neural toxicity; particulate matter embolism and blood vessel occlusion with resultant organ, and/or nervous system infarction; and/or aseptic necrosis of one or more joints. Finally, the patient was informed that Medicine is not an exact science; therefore, there is also the possibility of unforeseen or unpredictable risks and/or possible  complications that may result in a catastrophic outcome. The patient indicated having understood very clearly. We have given the patient no guarantees and we have made no promises. Enough time was given to the patient to ask questions, all of which were answered to the patient's satisfaction. Ms. Saban has indicated that she wanted to continue with the procedure. Attestation: I, the ordering provider, attest that I have discussed with the patient the benefits, risks, side-effects, alternatives, likelihood of achieving goals, and potential problems during recovery for the procedure that I have provided informed consent. Date   Time: 02/28/2022  9:18 AM  Pre-Procedure Preparation:  Monitoring: As per clinic protocol. Respiration, ETCO2, SpO2, BP, heart rate and rhythm monitor placed and checked for adequate function Safety Precautions: Patient was assessed for positional comfort and pressure points before starting the procedure. Time-out: I initiated and conducted the "Time-out" before starting the procedure, as per protocol. The patient was asked to participate by confirming the accuracy of the "Time Out" information. Verification of the correct person, site, and procedure were performed and confirmed by me, the nursing staff, and the patient. "Time-out" conducted as per Joint Commission's Universal Protocol (UP.01.01.01). Time: 0951  Description of Procedure:          Target Area: The interlaminar space, initially targeting the lower laminar border of the superior vertebral body. Approach: Paramedial approach. Area Prepped: Entire Posterior Lumbar Region DuraPrep (Iodine Povacrylex [0.7% available iodine] and Isopropyl Alcohol, 74% w/w) Safety Precautions: Aspiration looking for blood return was conducted prior to all injections. At no point did we inject any substances, as a needle was being advanced. No attempts were made at seeking any paresthesias. Safe injection practices and needle  disposal techniques used. Medications properly checked for expiration dates. SDV (single dose vial) medications used. Description of the Procedure: Protocol guidelines were followed. The procedure needle was introduced through the skin, ipsilateral to the reported pain, and advanced to the target area. Bone was contacted and the needle walked caudad, until the lamina was cleared. The epidural space was identified using loss-of-resistance technique with 2-3 ml of PF-NaCl (0.9% NSS), in a 5cc LOR glass syringe.  Vitals:   02/28/22 7782 02/28/22 4235  02/28/22 0952 02/28/22 0956  BP: (!) 151/116 (!) 167/110 (!) 162/113 (!) 158/102  Pulse:      Resp: '18 13 15 19  '$ Temp:      TempSrc:      SpO2: 98% 100% 99% 100%  Weight:      Height:         Start Time: 0951 hrs. End Time: 0956 hrs.  Materials:  Needle(s) Type: Epidural needle Gauge: 22G Length: 3.5-in Medication(s): Please see orders for medications and dosing details.  6 cc solution made of 3 cc of preservative-free saline, 2 cc of 0.2% ropivacaine, 1 cc of Decadron 10 mg/cc.  Imaging Guidance (Spinal):          Type of Imaging Technique: Fluoroscopy Guidance (Spinal) Indication(s): Assistance in needle guidance and placement for procedures requiring needle placement in or near specific anatomical locations not easily accessible without such assistance. Exposure Time: Please see nurses notes. Contrast: Before injecting any contrast, we confirmed that the patient did not have an allergy to iodine, shellfish, or radiological contrast. Once satisfactory needle placement was completed at the desired level, radiological contrast was injected. Contrast injected under live fluoroscopy. No contrast complications. See chart for type and volume of contrast used. Fluoroscopic Guidance: I was personally present during the use of fluoroscopy. "Tunnel Vision Technique" used to obtain the best possible view of the target area. Parallax error corrected  before commencing the procedure. "Direction-depth-direction" technique used to introduce the needle under continuous pulsed fluoroscopy. Once target was reached, antero-posterior, oblique, and lateral fluoroscopic projection used confirm needle placement in all planes. Images permanently stored in EMR. Interpretation: I personally interpreted the imaging intraoperatively. Adequate needle placement confirmed in multiple planes. Appropriate spread of contrast into desired area was observed. No evidence of afferent or efferent intravascular uptake. No intrathecal or subarachnoid spread observed. Permanent images saved into the patient's record.   Post-operative Assessment:  Post-procedure Vital Signs:  Pulse/HCG Rate: 9292 Temp:  98.1 F (36.7 C) Resp: 19 BP:  (!) 158/102 SpO2: 100 %  EBL: None  Complications: No immediate post-treatment complications observed by team, or reported by patient.  Note: The patient tolerated the entire procedure well. A repeat set of vitals were taken after the procedure and the patient was kept under observation following institutional policy, for this type of procedure. Post-procedural neurological assessment was performed, showing return to baseline, prior to discharge. The patient was provided with post-procedure discharge instructions, including a section on how to identify potential problems. Should any problems arise concerning this procedure, the patient was given instructions to immediately contact us, at any time, without hesitation. In any case, we plan to contact the patient by telephone for a follow-up status report regarding this interventional procedure.  Comments:  No additional relevant information.  Plan of Care  Orders:  Orders Placed This Encounter  Procedures   DG PA[facility] C-ARM 1-60 MIN NO REPORT    Intraoperative interpretation by procedural physician at [facility].    Standing Status:   Standing    Number of Occurrences:    1    Order Specific Question:   Reason for exam:    Answer:   Assistance in needle guidance and placement for procedures requiring needle placement in or near specific anatomical locations not easily accessible without such assistance.    Medications ordered for procedure: Meds ordered this encounter  Medications   iohexol (OMNIPAQUE) 180 MG/ML injection 10 mL    Must be Myelogram-compatible. If not available,  you may substitute with a water-soluble, non-ionic, hypoallergenic, myelogram-compatible radiological contrast medium.   lidocaine (XYLOCAINE) 2 % (with pres) injection 400 mg   sodium chloride flush (NS) 0.9 % injection 2 mL   ropivacaine (PF) 2 mg/mL (0.2%) (NAROPIN) injection 2 mL   dexamethasone (DECADRON) injection 10 mg   Medications administered: We administered iohexol, lidocaine, sodium chloride flush, ropivacaine (PF) 2 mg/mL (0.2%), and dexamethasone.  See the medical record for exact dosing, route, and time of administration.  Follow-up plan:   Return in about 8 weeks (around 04/25/2022) for Post Procedure Evaluation, virtual.     Recent Visits Date Type Provider Dept  01/29/22 Office Visit Gillis Santa, MD [facility]  01/01/22 Procedure visit Gillis Santa, MD [facility]  12/26/21 Office Visit Gillis Santa, MD [facility]  Showing recent visits within past 90 days and meeting all other requirements Today's Visits Date Type Provider Dept  02/28/22 Procedure visit Gillis Santa, MD [facility]  Showing today's visits and meeting all other requirements Future Appointments Date Type Provider Dept  04/25/22 Appointment Gillis Santa, MD [facility]  Showing future appointments within next 90 days and meeting all other requirements  Disposition: Discharge home  Discharge (Date   Time): 02/28/2022; 1003 hrs.   Primary Care Physician: Tracie Harrier, MD Location: [facility] Note  by: Gillis Santa, MD Date: 02/28/2022; Time: 10:14 AM  Disclaimer:  Medicine is not an exact science. The only guarantee in medicine is that nothing is guaranteed. It is important to note that the decision to proceed with this intervention was based on the information collected from the patient. The Data and conclusions were drawn from the patient's questionnaire, the interview, and the physical examination. Because the information was provided in large part by the patient, it cannot be guaranteed that it has not been purposely or unconsciously manipulated. Every effort has been made to obtain as much relevant data as possible for this evaluation. It is important to note that the conclusions that lead to this procedure are derived in large part from the available data. Always take into account that the treatment will also be dependent on availability of resources and existing treatment guidelines, considered by other Pain Management Practitioners as being common knowledge and practice, at the time of the intervention. For Medico-Legal purposes, it is also important to point out that variation in procedural techniques and pharmacological choices are the acceptable norm. The indications, contraindications, technique, and results of the above procedure should only be interpreted and judged by a Board-Certified Interventional Pain Specialist with extensive familiarity and expertise in the same exact procedure and technique.

## 2022-02-28 NOTE — Progress Notes (Signed)
Safety precautions to be maintained throughout the outpatient stay will include: orient to surroundings, keep bed in low position, maintain call bell within reach at all times, provide assistance with transfer out of bed and ambulation.  

## 2022-02-28 NOTE — Patient Instructions (Signed)

## 2022-03-01 ENCOUNTER — Telehealth: Payer: Self-pay | Admitting: *Deleted

## 2022-03-01 NOTE — Telephone Encounter (Signed)
Post procedure call:   no  questions or concerns.  

## 2022-03-15 ENCOUNTER — Ambulatory Visit: Payer: BC Managed Care – PPO

## 2022-03-15 ENCOUNTER — Other Ambulatory Visit: Payer: Self-pay

## 2022-03-15 ENCOUNTER — Ambulatory Visit
Admission: RE | Admit: 2022-03-15 | Discharge: 2022-03-15 | Disposition: A | Discharge status: Home / Self Care | Payer: BC Managed Care – PPO | Source: Ambulatory Visit | Attending: Obstetrics & Gynecology | Admitting: Obstetrics & Gynecology

## 2022-03-15 DIAGNOSIS — R928 Other abnormal and inconclusive findings on diagnostic imaging of breast: Secondary | ICD-10-CM

## 2022-04-24 ENCOUNTER — Telehealth: Payer: Self-pay | Admitting: *Deleted

## 2022-04-24 NOTE — Telephone Encounter (Signed)
Attempted to call for pre appointment review of allergies/meds. Message left. 

## 2022-04-25 ENCOUNTER — Ambulatory Visit
Payer: BC Managed Care – PPO | Attending: Student in an Organized Health Care Education/Training Program | Admitting: Student in an Organized Health Care Education/Training Program

## 2022-04-25 ENCOUNTER — Encounter: Payer: Self-pay | Admitting: Student in an Organized Health Care Education/Training Program

## 2022-04-25 DIAGNOSIS — M5416 Radiculopathy, lumbar region: Secondary | ICD-10-CM

## 2022-04-25 NOTE — Progress Notes (Signed)
I attempted to call the patient however no response. Voicemail left instructing patient to call front desk office at [phone] to reschedule appointment. -Dr Walton Digilio  

## 2022-05-05 ENCOUNTER — Other Ambulatory Visit: Payer: Self-pay | Admitting: Student in an Organized Health Care Education/Training Program

## 2022-05-16 ENCOUNTER — Encounter: Payer: Self-pay | Admitting: Student in an Organized Health Care Education/Training Program

## 2022-05-16 ENCOUNTER — Ambulatory Visit
Payer: BC Managed Care – PPO | Attending: Student in an Organized Health Care Education/Training Program | Admitting: Student in an Organized Health Care Education/Training Program

## 2022-05-16 VITALS — BP 155/101 | HR 74 | Temp 97.4°F | Resp 16 | Ht 61.0 in | Wt 184.0 lb

## 2022-05-16 DIAGNOSIS — G5771 Causalgia of right lower limb: Secondary | ICD-10-CM | POA: Insufficient documentation

## 2022-05-16 DIAGNOSIS — G894 Chronic pain syndrome: Secondary | ICD-10-CM | POA: Diagnosis present

## 2022-05-16 DIAGNOSIS — M792 Neuralgia and neuritis, unspecified: Secondary | ICD-10-CM | POA: Diagnosis present

## 2022-05-16 DIAGNOSIS — G5793 Unspecified mononeuropathy of bilateral lower limbs: Secondary | ICD-10-CM | POA: Diagnosis present

## 2022-05-16 DIAGNOSIS — M5416 Radiculopathy, lumbar region: Secondary | ICD-10-CM | POA: Diagnosis present

## 2022-05-16 MED ORDER — DULOXETINE HCL 60 MG PO CPEP: 60.0000 mg | ORAL_CAPSULE | Freq: Every day | ORAL | 2 refills | Status: DC

## 2022-05-16 NOTE — Progress Notes (Signed)
Safety precautions to be maintained throughout the outpatient stay will include: orient to surroundings, keep bed in low position, maintain call bell within reach at all times, provide assistance with transfer out of bed and ambulation.  

## 2022-05-16 NOTE — Patient Instructions (Signed)
______________________________________________________________________  Preparing for Procedure with Sedation  NOTICE: Due to recent regulatory changes, starting on July 24, 2021, procedures requiring intravenous (IV) sedation will no longer be performed at the [facility].  These types of procedures are required to be performed at [facility] ambulatory surgery facility.  We are very sorry for the inconvenience.  Procedure appointments are limited to planned procedures: No Prescription Refills. No disability issues will be discussed. No medication changes will be discussed.  Instructions: Oral Intake: Do not eat or drink anything for at least 8 hours prior to your procedure. (Exception: Blood Pressure Medication. See below.) Transportation: A driver is required. You may not drive yourself after the procedure. Blood Pressure Medicine: Do not forget to take your blood pressure medicine with a sip of water the morning of the procedure. If your Diastolic (lower reading) is above 100 mmHg, elective cases will be cancelled/rescheduled. Blood thinners: These will need to be stopped for procedures. Notify our staff if you are taking any blood thinners. Depending on which one you take, there will be specific instructions on how and when to stop it. Diabetics on insulin: Notify the staff so that you can be scheduled 1st case in the morning. If your diabetes requires high dose insulin, take only  of your normal insulin dose the morning of the procedure and notify the staff that you have done so. Preventing infections: Shower with an antibacterial soap the morning of your procedure. Build-up your immune system: Take 1000 mg of Vitamin C with every meal (3 times a day) the day prior to your procedure. Antibiotics: Inform the staff if you have a condition or reason that requires you to take antibiotics before dental procedures. Pregnancy: If you are pregnant, call and cancel the procedure. Sickness: If  you have a cold, fever, or any active infections, call and cancel the procedure. Arrival: You must be in the facility at least 30 minutes prior to your scheduled procedure. Children: Do not bring children with you. Dress appropriately: There is always the possibility that your clothing may get soiled. Valuables: Do not bring any jewelry or valuables.  Reasons to call and reschedule or cancel your procedure: (Following these recommendations will minimize the risk of a serious complication.) Surgeries: Avoid having procedures within 2 weeks of any surgery. (Avoid for 2 weeks before or after any surgery). Flu Shots: Avoid having procedures within 2 weeks of a flu shots. (Avoid for 2 weeks before or after immunizations). Barium: Avoid having a procedure within 7-10 days after having had a radiological study involving the use of radiological contrast. (Myelograms, Barium swallow or enema study). Heart attacks: Avoid any elective procedures or surgeries for the initial 6 months after a "Myocardial Infarction" (Heart Attack). Blood thinners: It is imperative that you stop these medications before procedures. Let us know if you if you take any blood thinner.  Infection: Avoid procedures during or within two weeks of an infection (including chest colds or gastrointestinal problems). Symptoms associated with infections include: Localized redness, fever, chills, night sweats or profuse sweating, burning sensation when voiding, cough, congestion, stuffiness, runny nose, sore throat, diarrhea, nausea, vomiting, cold or Flu symptoms, recent or current infections. It is specially important if the infection is over the area that we intend to treat. Heart and lung problems: Symptoms that may suggest an active cardiopulmonary problem include: cough, chest pain, breathing difficulties or shortness of breath, dizziness, ankle swelling, uncontrolled high or unusually low blood pressure, and/or palpitations. If you are    experiencing any of these symptoms, cancel your procedure and contact your primary care physician for an evaluation.  Remember:  Regular Business hours are:  Monday to Thursday 8:00 AM to 4:00 PM  Provider's Schedule: Milinda Pointer, MD:  Procedure days: Tuesday and Thursday 7:30 AM to 4:00 PM  Gillis Santa, MD:  Procedure days: Monday and Wednesday 7:30 AM to 4:00 PM ______________________________________________________________________  Epidural Steroid Injection  An epidural steroid injection is a shot of steroid medicine and numbing medicine that is given into the space between the spinal cord and the bones of the back (epidural space). The shot helps relieve pain caused by an irritated or swollen nerve root. The amount of pain relief you get from the injection depends on what is causing the nerve to be swollen and irritated, and how long your pain lasts. You are more likely to benefit from this injection if your pain is strong and comes on suddenly rather than if you have had long-term (chronic) pain. Tell a health care provider about: Any allergies you have. All medicines you are taking, including vitamins, herbs, eye drops, creams, and over-the-counter medicines. Any problems you or family members have had with anesthetic medicines. Any blood disorders you have. Any surgeries you have had. Any medical conditions you have. Whether you are pregnant or may be pregnant. What are the risks? Generally, this is a safe procedure. However, problems may occur, including: Headache. Bleeding. Infection. Allergic reaction to medicines. Nerve damage. What happens before the procedure? Staying hydrated Follow instructions from your health care provider about hydration, which may include: Up to 2 hours before the procedure - you may continue to drink clear liquids, such as water, clear fruit juice, black coffee, and plain tea. Eating and drinking restrictions Follow instructions from  your health care provider about eating and drinking, which may include: 8 hours before the procedure - stop eating heavy meals or foods, such as meat, fried foods, or fatty foods. 6 hours before the procedure - stop eating light meals or foods, such as toast or cereal. 6 hours before the procedure - stop drinking milk or drinks that contain milk. 2 hours before the procedure - stop drinking clear liquids. Medicines You may be given medicines to lower anxiety. Ask your health care provider about: Changing or stopping your regular medicines. This is especially important if you are taking diabetes medicines or blood thinners. Taking medicines such as aspirin and ibuprofen. These medicines can thin your blood. Do not take these medicines unless your health care provider tells you to take them. Taking over-the-counter medicines, vitamins, herbs, and supplements. General instructions Ask your health care provider what steps will be taken to prevent infection. Plan to have a responsible adult take you home from the hospital or clinic. If you will be going home right after the procedure, plan to have a responsible adult care for you for the time you are told. This is important. What happens during the procedure? An IV will be inserted into one of your veins. You will be given one or more of the following: A medicine to help you relax (sedative). A medicine to numb the area (local anesthetic). You will be asked to lie on your abdomen or sit. The injection site will be cleaned. A needle will be inserted through your skin into the epidural space. This may cause you some discomfort. An X-ray machine will be used to guide the needle as close as possible to the affected nerve. A steroid medicine and  a local anesthetic will be injected into the epidural space. The needle and IV will be removed. A bandage (dressing) will be put over the injection site. The procedure may vary among health care providers and  hospitals. What can I expect after the procedure? Your blood pressure, heart rate, breathing rate, and blood oxygen level will be monitored until you leave the hospital or clinic. Your arm or leg may feel weak or numb for a few hours. The injection site may feel sore. Follow these instructions at home: Injection site care You may remove the bandage (dressing) after 24 hours. Check your injection site every day for signs of infection. Check for: Redness, swelling, or pain. Fluid or blood. Warmth. Pus or a bad smell. Managing pain, stiffness, and swelling For 24 hours after the procedure: Avoid using heat on the injection site. Do not take baths, swim, or use a hot tub until your health care provider approves. Ask your health care provider if you may take showers. You may only be allowed to take sponge baths. If directed, put ice on the injection site. To do this: Put ice in a plastic bag. Place a towel between your skin and the bag. Leave the ice on for 20 minutes, 2-3 times a day.  Activity If you were given a sedative during the procedure, it can affect you for several hours. Do not drive or operate machinery until your health care provider says that it is safe. Return to your normal activities as told by your health care provider. Ask your health care provider what activities are safe for you. General instructions Take over-the-counter and prescription medicines only as told by your health care provider. Drink enough fluid to keep your urine pale yellow. Keep all follow-up visits as told by your health care provider. This is important. Contact a health care provider if: You have any of these signs of infection: Redness, swelling, or pain around your injection site. Fluid or blood coming from your injection site. Warmth coming from your injection site. Pus or a bad smell coming from your injection site. A fever. You continue to have pain and soreness around the injection site,  even after taking over-the-counter pain medicine. You have severe, sudden, or lasting nausea or vomiting. Get help right away if: You have severe pain at the injection site that is not relieved by medicines. You develop a severe headache or a stiff neck. You become sensitive to light. You have any new numbness or weakness in your legs or arms. You lose control of your bladder or bowel movements. You have trouble breathing. Summary An epidural steroid injection is a shot of steroid medicine and numbing medicine that is given into the epidural space. The shot helps relieve pain caused by an irritated or swollen nerve root. You are more likely to benefit from this injection if your pain is strong and comes on suddenly rather than if you have had chronic pain. This information is not intended to replace advice given to you by your health care provider. Make sure you discuss any questions you have with your health care provider. Document Revised: 11/09/2021 Document Reviewed: 06/22/2019 Elsevier Patient Education  [place].

## 2022-05-16 NOTE — Progress Notes (Signed)
PROVIDER NOTE: Information contained herein reflects review and annotations entered in association with encounter. Interpretation of such information and data should be left to medically-trained personnel. Information provided to patient can be located elsewhere in the medical record under "Patient Instructions". Document created using STT-dictation technology, any transcriptional errors that may result from process are unintentional.    Patient: Valerie Welch  Service Category: E/M  Provider: Gillis Santa, MD  DOB: [birth year]  DOS: 05/16/2022  Specialty: Interventional Pain Management  MRN: [id number]  Setting: Ambulatory outpatient  PCP: Tracie Harrier, MD  Type: Established Patient    Referring Provider: Tracie Harrier, MD  Location: Office  Delivery: Face-to-face     HPI  Valerie Welch, a 51 y.o. year old female, is here today because of her Lumbar radiculopathy [M54.16]. Valerie Welch primary complain today is Back Pain Last encounter: My last encounter with her was on 05/05/2022. Pertinent problems: Valerie Welch has History of foot surgery; Generalized anxiety disorder; Neuropathic pain of both legs; Complex regional pain syndrome type II of right lower limb; and Neuropathic pain of right foot on their pertinent problem list. Pain Assessment: Severity of Chronic pain is reported as a 5 /10. Location: Back  /right buttocks down side of right leg. Onset: More than a month ago. Quality: Aching, Burning, Constant, Tender, Discomfort (swelling). Timing: Constant. Modifying factor(s): rest, ice, meds. Vitals:  height is '5\' 1"'  (1.549 m) and weight is 184 lb (83.5 kg). Her temperature is 97.4 F (36.3 C) (abnormal). Her blood pressure is 155/101 (abnormal) and her pulse is 74. Her respiration is 16 and oxygen saturation is 99%.   Reason for encounter: post-procedure evaluation and assessment.    Post-procedure evaluation    Procedure:          Anesthesia, Analgesia,  Anxiolysis:  Type: Therapeutic Inter-Laminar Epidural Steroid Injection  #3 (#1: 11/27/21, #2 01/01/22)  Region: Lumbar Level: L4-5 Level. Laterality: Right-Sided         Anesthesia: Local (1-2% Lidocaine)  Anxiolysis: None  Sedation: None  Guidance: Fluoroscopy           Position: Prone with head of the table was raised to facilitate breathing.   Indications: 1. Lumbar radiculopathy   2. Chronic pain syndrome    Pain Score: Pre-procedure: 6 /10 Post-procedure: 6 /10     Effectiveness:  Initial hour after procedure: 100 %  Subsequent 4-6 hours post-procedure: 100 %  Analgesia past initial 6 hours: 75 % (3 weeks)  Ongoing improvement:  Analgesic:  45-50% however pain is returning Function: Somewhat improved ROM: Back to baseline    ROS  Constitutional: Denies any fever or chills Gastrointestinal: No reported hemesis, hematochezia, vomiting, or acute GI distress Musculoskeletal: Denies any acute onset joint swelling, redness, loss of ROM, or weakness Neurological:  Return of right lower extremity radicular pain which she describes as burning and tingling  Medication Review  ALPRAZolam, B-12, Bepotastine Besilate, DSS, DULoxetine, Fluticasone-Salmeterol, HYDROcodone-Acetaminophen, Melatonin, NON FORMULARY, azelastine, buPROPion, cefUROXime, cetirizine, diclofenac sodium, gabapentin, iron polysaccharides, levothyroxine, montelukast, multivitamin with minerals, omeprazole, polyethylene glycol, rosuvastatin, trolamine salicylate, and vitamin C  History Review  Allergy: Valerie Welch is allergic to beef-derived products, other, percocet [oxycodone-acetaminophen], and pitocin [oxytocin]. [name]  reports no history of drug use. Alcohol:  reports that she does not currently use alcohol. Tobacco:  reports that she has never smoked. She has never used smokeless tobacco. [name]  reports that she has never smoked. She has never used smokeless  tobacco. She reports that she does not currently use alcohol. She reports that she does not use drugs. Medical:  has a past medical history of Anemia, Arthritis, Asthma, Barrett's esophagus, Bursitis, Depression, Fibromyalgia, GERD (gastroesophageal reflux disease), History of hiatal hernia, Hypothyroidism, and Nontoxic uninodular goiter. Surgical: Valerie Welch  has a past surgical history that includes Cesarean section; Cholecystectomy, laparoscopic; Ankle arthroscopy (Right, 09/26/2018); Tenotomy / flexor tendon transfer (Right, 09/26/2018); Arthrodesis metatarsalphalangeal joint (mtpj) (Right, 09/26/2018); Exostectectomy toe (Right, 09/26/2018); Cholecystectomy; Colonoscopy with propofol (N/A, 06/16/2020); Esophagogastroduodenoscopy (egd) with propofol (N/A, 06/16/2020); and Robotic assisted laparoscopic hysterectomy and salpingectomy (N/A, 07/05/2020). Family: family history includes Cancer in her paternal grandfather; Diabetes in her mother; Heart disease in her mother; Hypertension in her maternal grandfather; Stroke in her brother, father, and mother.  Laboratory Chemistry Profile   Renal Lab Results  Component Value Date   BUN 7 07/01/2020   CREATININE 1.00 07/01/2020   GFRAA >60 07/01/2020   GFRNONAA >60 07/01/2020    Hepatic No results found for: AST, ALT, ALBUMIN, ALKPHOS, HCVAB, AMYLASE, LIPASE, AMMONIA  Electrolytes Lab Results  Component Value Date   NA 139 07/01/2020   K 4.5 07/01/2020   CL 108 07/01/2020   CALCIUM 9.2 07/01/2020    Bone No results found for: VD25OH, VD125OH2TOT, BW4665LD3, TT0177LT9, 25OHVITD1, 25OHVITD2, 25OHVITD3, TESTOFREE, TESTOSTERONE  Inflammation (CRP: Acute Phase) (ESR: Chronic Phase) No results found for: CRP, ESRSEDRATE, LATICACIDVEN       Note: Above Lab results reviewed.  Recent Imaging Review  MM DIAG BREAST TOMO UNI RIGHT CLINICAL DATA:  51 year old female recalled from screening mammogram dated 02/21/2022 for a possible right breast  asymmetry.  EXAM: DIGITAL DIAGNOSTIC UNILATERAL RIGHT MAMMOGRAM WITH TOMOSYNTHESIS AND CAD  TECHNIQUE: Right digital diagnostic mammography and breast tomosynthesis was performed. The images were evaluated with computer-aided detection.  COMPARISON:  Previous exam(s).  ACR Breast Density Category c: The breast tissue is heterogeneously dense, which may obscure small masses.  FINDINGS: Previously described, possible asymmetry in the lateral right breast at mid depth is seen on the cc projection resolves into well dispersed fibroglandular tissue on additional views. No suspicious findings are identified.  IMPRESSION: No mammographic evidence of malignancy.  RECOMMENDATION: Screening mammogram in one year.(Code:SM-B-01Y)  I have discussed the findings and recommendations with the patient. If applicable, a reminder letter will be sent to the patient regarding the next appointment.  BI-RADS CATEGORY  1: Negative.  Electronically Signed   By: Kristopher Oppenheim M.D.   On: 03/15/2022 11:35 Note: Reviewed        Physical Exam  General appearance: Well nourished, well developed, and well hydrated. In no apparent acute distress Mental status: Alert, oriented x 3 (person, place, & time)       Respiratory: No evidence of acute respiratory distress Eyes: PERLA Vitals: BP (!) 155/101   Pulse 74   Temp (!) 97.4 F (36.3 C)   Resp 16   Ht '5\' 1"'  (1.549 m)   Wt 184 lb (83.5 kg)   LMP 09/10/2019   SpO2 99%   BMI 34.77 kg/m  BMI: Estimated body mass index is 34.77 kg/m as calculated from the following:   Height as of this encounter: '5\' 1"'  (1.549 m).   Weight as of this encounter: 184 lb (83.5 kg). Ideal: Ideal body weight: 47.8 kg (105 lb 6.1 oz) Adjusted ideal body weight: 62.1 kg (136 lb 13.2 oz)  Lumbar Spine Area Exam  Skin & Axial Inspection: No masses, redness, or swelling Alignment: Symmetrical  Functional ROM: Pain restricted ROM       Stability: No instability  detected Muscle Tone/Strength: Functionally intact. No obvious neuro-muscular anomalies detected. Sensory (Neurological): Dermatomal pain pattern right L4-L5 Palpation: No palpable anomalies       Provocative Tests: Hyperextension/rotation test: deferred today       Lumbar quadrant test (Kemp's test): deferred today           Lower Extremity Exam      Side: Right lower extremity   Side: Left lower extremity  Stability: No instability observed           Stability: No instability observed          Skin & Extremity Inspection: Evidence of prior arthroplastic surgery redness, decreased hair growth, cool to touch, swelling   Skin & Extremity Inspection: Skin color, temperature, and hair growth are WNL. No peripheral edema or cyanosis. No masses, redness, swelling, asymmetry, or associated skin lesions. No contractures.  Functional ROM: Pain restricted ROM                   Functional ROM: Unrestricted ROM                  Muscle Tone/Strength: Functionally intact. No obvious neuro-muscular anomalies detected.   Muscle Tone/Strength: Functionally intact. No obvious neuro-muscular anomalies detected.  Sensory (Neurological): Neurogenic pain pattern positive straight leg raise test         Sensory (Neurological): Unimpaired        DTR: Patellar: deferred today Achilles: deferred today Plantar: deferred today   DTR: Patellar: deferred today Achilles: deferred today Plantar: deferred today  Palpation: No palpable anomalies   Palpation: No palpable anomalies    Assessment   Diagnosis Status  1. Lumbar radiculopathy   2. Complex regional pain syndrome type II of right lower limb   3. Neuropathic pain of right foot   4. Neuropathic pain of both legs   5. Chronic pain syndrome    Responding Controlled Controlled     Plan of Care    Valerie Welch has a current medication list which includes the following long-term medication(s): azelastine, cetirizine, gabapentin, iron  polysaccharides, levothyroxine, montelukast, omeprazole, and duloxetine.  Valerie Welch endorses the best response to her previous lumbar epidural steroid injection compared to her previous to that she has had.  She went to [facility] with her sister and was able to walk 10,000-13,000 steps per day which she states that she would not be able to do without her epidural injection.  She states that her right leg pain is starting to return and she describes it as severe burning and tingling.  We discussed repeating a lumbar epidural steroid injection and I advised her to limit her spinal injections to no more than 4 times per year.  Patient is not a diabetic but I did caution her on the risk of chronic steroid exposure as a pertains to osteoporosis blood sugars and cardiovascular risk.  I did discuss spinal cord stimulation as a more long-lasting treatment option that she could consider.  I provided her with resources for [employer].  We can discuss this further in the future.  Otherwise we will get her set up for a repeat lumbar epidural steroid injection in the next 2 weeks and I will also refill her Cymbalta as below.    Pharmacotherapy (Medications Ordered): Meds ordered this encounter  Medications   DULoxetine (CYMBALTA) 60 MG capsule    Sig: Take 1 capsule (  60 mg total) by mouth daily.    Dispense:  90 capsule    Refill:  2   Orders:  Orders Placed This Encounter  Procedures   Lumbar Epidural Injection    Standing Status:   Future    Standing Expiration Date:   06/16/2022    Scheduling Instructions:     Procedure: Interlaminar Lumbar Epidural Steroid injection (LESI)            Right L4/5 #2    Order Specific Question:   Where will this procedure be performed?    Answer:   [facility]   Follow-up plan:   Return in about 2 weeks (around 05/30/2022) for Right L4/5 ESI , in clinic [facility].     Right lower extremity CRPS, lumbar sympathetic nerve block denied by insurance.  Right L4-5 ESI #2  01/01/22, #3 02/28/22. Consider spinal cord stimulator trial in future.       Recent Visits Date Type Provider Dept  02/28/22 Procedure visit Gillis Santa, MD [facility]  Showing recent visits within past 90 days and meeting all other requirements Today's Visits Date Type Provider Dept  05/16/22 Office Visit Gillis Santa, MD [facility]  Showing today's visits and meeting all other requirements Future Appointments No visits were found meeting these conditions. Showing future appointments within next 90 days and meeting all other requirements  I discussed the assessment and treatment plan with the patient. The patient was provided an opportunity to ask questions and all were answered. The patient agreed with the plan and demonstrated an understanding of the instructions.  Patient advised to call back or seek an in-person evaluation if the symptoms or condition worsens.  Duration of encounter: 30 minutes.  Note by: Gillis Santa, MD Date: 05/16/2022; Time: 10:49 AM

## 2022-05-28 ENCOUNTER — Encounter: Payer: Self-pay | Admitting: Student in an Organized Health Care Education/Training Program

## 2022-05-28 ENCOUNTER — Ambulatory Visit
Payer: BC Managed Care – PPO | Attending: Student in an Organized Health Care Education/Training Program | Admitting: Student in an Organized Health Care Education/Training Program

## 2022-05-28 ENCOUNTER — Ambulatory Visit
Admission: RE | Admit: 2022-05-28 | Discharge: 2022-05-28 | Disposition: A | Discharge status: Home / Self Care | Payer: BC Managed Care – PPO | Source: Ambulatory Visit | Attending: Student in an Organized Health Care Education/Training Program | Admitting: Student in an Organized Health Care Education/Training Program

## 2022-05-28 DIAGNOSIS — M5416 Radiculopathy, lumbar region: Secondary | ICD-10-CM | POA: Diagnosis present

## 2022-05-28 DIAGNOSIS — G894 Chronic pain syndrome: Secondary | ICD-10-CM

## 2022-05-28 DIAGNOSIS — G5771 Causalgia of right lower limb: Secondary | ICD-10-CM | POA: Diagnosis present

## 2022-05-28 DIAGNOSIS — G5793 Unspecified mononeuropathy of bilateral lower limbs: Secondary | ICD-10-CM | POA: Diagnosis present

## 2022-05-28 DIAGNOSIS — M792 Neuralgia and neuritis, unspecified: Secondary | ICD-10-CM | POA: Diagnosis present

## 2022-05-28 MED ORDER — SODIUM CHLORIDE 0.9% FLUSH
2.0000 mL | Freq: Once | INTRAVENOUS | Status: AC
  Administered 2022-05-28: 2 mL

## 2022-05-28 MED ORDER — LIDOCAINE HCL 2 % IJ SOLN
20.0000 mL | Freq: Once | INTRAMUSCULAR | Status: AC
  Administered 2022-05-28: 400 mg

## 2022-05-28 MED ORDER — IOHEXOL 180 MG/ML  SOLN
10.0000 mL | Freq: Once | INTRAMUSCULAR | Status: AC
  Administered 2022-05-28: 5 mL via EPIDURAL

## 2022-05-28 MED ORDER — DEXAMETHASONE SODIUM PHOSPHATE 10 MG/ML IJ SOLN
10.0000 mg | Freq: Once | INTRAMUSCULAR | Status: AC
  Administered 2022-05-28: 10 mg

## 2022-05-28 MED ORDER — ROPIVACAINE HCL 2 MG/ML IJ SOLN
2.0000 mL | Freq: Once | INTRAMUSCULAR | Status: AC
Start: 2022-05-28 — End: 2022-05-28
  Administered 2022-05-28: 2 mL via EPIDURAL

## 2022-05-28 NOTE — Progress Notes (Signed)
Safety precautions to be maintained throughout the outpatient stay will include: orient to surroundings, keep bed in low position, maintain call bell within reach at all times, provide assistance with transfer out of bed and ambulation.  

## 2022-05-28 NOTE — Progress Notes (Signed)
PROVIDER NOTE: Information contained herein reflects review and annotations entered in association with encounter. Interpretation of such information and data should be left to medically-trained personnel. Information provided to patient can be located elsewhere in the medical record under "Patient Instructions". Document created using STT-dictation technology, any transcriptional errors that may result from process are unintentional.    Patient: Valerie Welch  Service Category: Procedure Provider: Gillis Santa, MD DOB: [birth year] DOS: 05/28/2022 Location: [facility] MRN: [id number] Setting: Ambulatory - outpatient Referring Provider: Gillis Santa, MD Type: Established Patient Specialty: Interventional Pain Management PCP: Tracie Harrier, MD  Primary Reason for Visit: Interventional Pain Management Treatment. CC: Back Pain (lower)    Procedure:          Anesthesia, Analgesia, Anxiolysis:  Type: Therapeutic Inter-Laminar Epidural Steroid Injection  #4 (#1: 11/27/21, #2 01/01/22, #3 02/28/22 )  Region: Lumbar Level: L4-5 Level. Laterality: Right-Sided         Anesthesia: Local (1-2% Lidocaine)  Anxiolysis: None  Sedation: None  Guidance: Fluoroscopy           Position: Prone with head of the table was raised to facilitate breathing.   Indications: 1. Lumbar radiculopathy   2. Complex regional pain syndrome type II of right lower limb   3. Neuropathic pain of right foot   4. Neuropathic pain of both legs   5. Chronic pain syndrome    Pain Score: Pre-procedure: 8 /10 Post-procedure: 8 /10    Pre-op H&P Assessment:  Valerie Welch is a 51 y.o. (year old), female patient, seen today for interventional treatment. She  has a past surgical history that includes Cesarean section; Cholecystectomy, laparoscopic; Ankle arthroscopy (Right, 09/26/2018); Tenotomy / flexor tendon transfer (Right, 09/26/2018); Arthrodesis metatarsalphalangeal joint (mtpj) (Right, 09/26/2018);  Exostectectomy toe (Right, 09/26/2018); Cholecystectomy; Colonoscopy with propofol (N/A, 06/16/2020); Esophagogastroduodenoscopy (egd) with propofol (N/A, 06/16/2020); and Robotic assisted laparoscopic hysterectomy and salpingectomy (N/A, 07/05/2020). Valerie Welch has a current medication list which includes the following prescription(s): alprazolam, vitamin c, azelastine, bepreve, bupropion, bupropion, bupropion, cefuroxime, cetirizine, b-12, diclofenac sodium, dss, duloxetine, fluticasone-salmeterol, gabapentin, hydrocodone-acetaminophen, iron polysaccharides, levothyroxine, melatonin, montelukast, multivitamin with minerals, NON FORMULARY, omeprazole, polyethylene glycol, rosuvastatin, and trolamine salicylate. Her primarily concern today is the Back Pain (lower)   Initial Vital Signs:  Pulse/HCG Rate: 87ECG Heart Rate: 84 Temp:  (!) 97.5 F (36.4 C) Resp: 20 BP:  (!) 162/113 SpO2: 100 %  BMI: Estimated body mass index is 34.77 kg/m as calculated from the following:   Height as of this encounter: '5\' 1"'$  (1.549 m).   Weight as of this encounter: 184 lb (83.5 kg).  Risk Assessment: Allergies: Reviewed. She is allergic to beef-derived products, other, percocet [oxycodone-acetaminophen], and pitocin [oxytocin].  Allergy Precautions: None required Coagulopathies: Reviewed. None identified.  Blood-thinner therapy: None at this time Active Infection(s): Reviewed. None identified. Valerie Welch is afebrile  Site Confirmation: Valerie Welch was asked to confirm the procedure and laterality before marking the site Procedure checklist: Completed Consent: Before the procedure and under the influence of no sedative(s), amnesic(s), or anxiolytics, the patient was informed of the treatment options, risks and possible complications. To fulfill our ethical and legal obligations, as recommended by the American Medical Association's Code of Ethics, I have informed the patient of my clinical  impression; the nature and purpose of the treatment or procedure; the risks, benefits, and possible complications of the intervention; the alternatives, including doing nothing; the risk(s) and benefit(s) of the alternative treatment(s) or procedure(s); and the risk(s) and  benefit(s) of doing nothing. The patient was provided information about the general risks and possible complications associated with the procedure. These may include, but are not limited to: failure to achieve desired goals, infection, bleeding, organ or nerve damage, allergic reactions, paralysis, and death. In addition, the patient was informed of those risks and complications associated to Spine-related procedures, such as failure to decrease pain; infection (i.e.: Meningitis, epidural or intraspinal abscess); bleeding (i.e.: epidural hematoma, subarachnoid hemorrhage, or any other type of intraspinal or peri-dural bleeding); organ or nerve damage (i.e.: Any type of peripheral nerve, nerve root, or spinal cord injury) with subsequent damage to sensory, motor, and/or autonomic systems, resulting in permanent pain, numbness, and/or weakness of one or several areas of the body; allergic reactions; (i.e.: anaphylactic reaction); and/or death. Furthermore, the patient was informed of those risks and complications associated with the medications. These include, but are not limited to: allergic reactions (i.e.: anaphylactic or anaphylactoid reaction(s)); adrenal axis suppression; blood sugar elevation that in diabetics may result in ketoacidosis or comma; water retention that in patients with history of congestive heart failure may result in shortness of breath, pulmonary edema, and decompensation with resultant heart failure; weight gain; swelling or edema; medication-induced neural toxicity; particulate matter embolism and blood vessel occlusion with resultant organ, and/or nervous system infarction; and/or aseptic necrosis of one or more  joints. Finally, the patient was informed that Medicine is not an exact science; therefore, there is also the possibility of unforeseen or unpredictable risks and/or possible complications that may result in a catastrophic outcome. The patient indicated having understood very clearly. We have given the patient no guarantees and we have made no promises. Enough time was given to the patient to ask questions, all of which were answered to the patient's satisfaction. Ms. Guedea has indicated that she wanted to continue with the procedure. Attestation: I, the ordering provider, attest that I have discussed with the patient the benefits, risks, side-effects, alternatives, likelihood of achieving goals, and potential problems during recovery for the procedure that I have provided informed consent. Date  Time: 05/28/2022  9:54 AM  Pre-Procedure Preparation:  Monitoring: As per clinic protocol. Respiration, ETCO2, SpO2, BP, heart rate and rhythm monitor placed and checked for adequate function Safety Precautions: Patient was assessed for positional comfort and pressure points before starting the procedure. Time-out: I initiated and conducted the "Time-out" before starting the procedure, as per protocol. The patient was asked to participate by confirming the accuracy of the "Time Out" information. Verification of the correct person, site, and procedure were performed and confirmed by me, the nursing staff, and the patient. "Time-out" conducted as per Joint Commission's Universal Protocol (UP.01.01.01). Time: 1022  Description of Procedure:          Target Area: The interlaminar space, initially targeting the lower laminar border of the superior vertebral body. Approach: Paramedial approach. Area Prepped: Entire Posterior Lumbar Region DuraPrep (Iodine Povacrylex [0.7% available iodine] and Isopropyl Alcohol, 74% w/w) Safety Precautions: Aspiration looking for blood return was conducted prior to all  injections. At no point did we inject any substances, as a needle was being advanced. No attempts were made at seeking any paresthesias. Safe injection practices and needle disposal techniques used. Medications properly checked for expiration dates. SDV (single dose vial) medications used. Description of the Procedure: Protocol guidelines were followed. The procedure needle was introduced through the skin, ipsilateral to the reported pain, and advanced to the target area. Bone was contacted and the needle walked caudad, until  the lamina was cleared. The epidural space was identified using "loss-of-resistance technique" with 2-3 ml of PF-NaCl (0.9% NSS), in a 5cc LOR glass syringe.  Vitals:   05/28/22 0955 05/28/22 1022 05/28/22 1027  BP: (!) 162/113 (!) 170/121 (!) 169/118  Pulse: 87    Resp:  20 16  Temp: (!) 97.5 F (36.4 C)    SpO2: 100% 97% 100%  Weight: 184 lb (83.5 kg)    Height: '5\' 1"'$  (1.549 m)       Start Time: 1022 hrs. End Time: 1026 hrs.  Materials:  Needle(s) Type: Epidural needle Gauge: 22G Length: 3.5-in Medication(s): Please see orders for medications and dosing details.  6 cc solution made of 3 cc of preservative-free saline, 2 cc of 0.2% ropivacaine, 1 cc of Decadron 10 mg/cc.  Imaging Guidance (Spinal):          Type of Imaging Technique: Fluoroscopy Guidance (Spinal) Indication(s): Assistance in needle guidance and placement for procedures requiring needle placement in or near specific anatomical locations not easily accessible without such assistance. Exposure Time: Please see nurses notes. Contrast: Before injecting any contrast, we confirmed that the patient did not have an allergy to iodine, shellfish, or radiological contrast. Once satisfactory needle placement was completed at the desired level, radiological contrast was injected. Contrast injected under live fluoroscopy. No contrast complications. See chart for type and volume of contrast used. Fluoroscopic  Guidance: I was personally present during the use of fluoroscopy. "Tunnel Vision Technique" used to obtain the best possible view of the target area. Parallax error corrected before commencing the procedure. "Direction-depth-direction" technique used to introduce the needle under continuous pulsed fluoroscopy. Once target was reached, antero-posterior, oblique, and lateral fluoroscopic projection used confirm needle placement in all planes. Images permanently stored in EMR. Interpretation: I personally interpreted the imaging intraoperatively. Adequate needle placement confirmed in multiple planes. Appropriate spread of contrast into desired area was observed. No evidence of afferent or efferent intravascular uptake. No intrathecal or subarachnoid spread observed. Permanent images saved into the patient's record.   Post-operative Assessment:  Post-procedure Vital Signs:  Pulse/HCG Rate: 8777 Temp:  (!) 97.5 F (36.4 C) Resp: 16 BP:  (!) 169/118 (Dr. Holley Raring aware of elevated BPs during entire visit.) SpO2: 100 %  EBL: None  Complications: No immediate post-treatment complications observed by team, or reported by patient.  Note: The patient tolerated the entire procedure well. A repeat set of vitals were taken after the procedure and the patient was kept under observation following institutional policy, for this type of procedure. Post-procedural neurological assessment was performed, showing return to baseline, prior to discharge. The patient was provided with post-procedure discharge instructions, including a section on how to identify potential problems. Should any problems arise concerning this procedure, the patient was given instructions to immediately contact us, at any time, without hesitation. In any case, we plan to contact the patient by telephone for a follow-up status report regarding this interventional procedure.  Comments:  No additional relevant information.  Plan of Care   Orders:  Orders Placed This Encounter  Procedures   DG PAIN [facility] C-ARM 1-60 MIN NO REPORT    Intraoperative interpretation by procedural physician at [place].    Standing Status:   Standing    Number of Occurrences:   1    Order Specific Question:   Reason for exam:    Answer:   Assistance in needle guidance and placement for procedures requiring needle placement in or near specific anatomical locations  not easily accessible without such assistance.    Medications ordered for procedure: Meds ordered this encounter  Medications   iohexol (OMNIPAQUE) 180 MG/ML injection 10 mL    Must be Myelogram-compatible. If not available, you may substitute with a water-soluble, non-ionic, hypoallergenic, myelogram-compatible radiological contrast medium.   lidocaine (XYLOCAINE) 2 % (with pres) injection 400 mg   sodium chloride flush (NS) 0.9 % injection 2 mL   ropivacaine (PF) 2 mg/mL (0.2%) (NAROPIN) injection 2 mL   dexamethasone (DECADRON) injection 10 mg   Medications administered: We administered iohexol, lidocaine, sodium chloride flush, ropivacaine (PF) 2 mg/mL (0.2%), and dexamethasone.  See the medical record for exact dosing, route, and time of administration.  Follow-up plan:   Return in about 6 weeks (around 07/09/2022) for Post Procedure Evaluation, virtual.     Recent Visits Date Type Provider Dept  05/16/22 Office Visit Gillis Santa, MD [facility]  02/28/22 Procedure visit Gillis Santa, MD [facility]  Showing recent visits within past 90 days and meeting all other requirements Today's Visits Date Type Provider Dept  05/28/22 Procedure visit Gillis Santa, MD [facility]  Showing today's visits and meeting all other requirements Future Appointments No visits were found meeting these conditions. Showing future appointments within next 90 days and meeting all other requirements  Disposition: Discharge home  Discharge (Date   Time): 05/28/2022; 1035 hrs.   Primary Care Physician: Tracie Harrier, MD Location: [facility] Note by: Gillis Santa, MD Date: 05/28/2022; Time: 10:32 AM  Disclaimer:  Medicine is not an exact science. The only guarantee in medicine is that nothing is guaranteed. It is important to note that the decision to proceed with this intervention was based on the information collected from the patient. The Data and conclusions were drawn from the patient's questionnaire, the interview, and the physical examination. Because the information was provided in large part by the patient, it cannot be guaranteed that it has not been purposely or unconsciously manipulated. Every effort has been made to obtain as much relevant data as possible for this evaluation. It is important to note that the conclusions that lead to this procedure are derived in large part from the available data. Always take into account that the treatment will also be dependent on availability of resources and existing treatment guidelines, considered by other Pain Management Practitioners as being common knowledge and practice, at the time of the intervention. For Medico-Legal purposes, it is also important to point out that variation in procedural techniques and pharmacological choices are the acceptable norm. The indications, contraindications, technique, and results of the above procedure should only be interpreted and judged by a Board-Certified Interventional Pain Specialist with extensive familiarity and expertise in the same exact procedure and technique.

## 2022-05-28 NOTE — Patient Instructions (Signed)
Pain Management Discharge Instructions  General Discharge Instructions :  If you need to reach your doctor call: Monday-Friday 8:00 am - 4:00 pm at [phone] or toll free [phone].  After clinic hours [phone] to have operator reach doctor.  Bring all of your medication bottles to all your appointments in the pain clinic.  To cancel or reschedule your appointment with Pain Management please remember to call 24 hours in advance to avoid a fee.  Refer to the educational materials which you have been given on: General Risks, I had my Procedure. Discharge Instructions, Post Sedation.  Post Procedure Instructions:  The drugs you were given will stay in your system until tomorrow, so for the next 24 hours you should not drive, make any legal decisions or drink any alcoholic beverages.  You may eat anything you prefer, but it is better to start with liquids then soups and crackers, and gradually work up to solid foods.  Please notify your doctor immediately if you have any unusual bleeding, trouble breathing or pain that is not related to your normal pain.  Depending on the type of procedure that was done, some parts of your body may feel week and/or numb.  This usually clears up by tonight or the next day.  Walk with the use of an assistive device or accompanied by an adult for the 24 hours.  You may use ice on the affected area for the first 24 hours.  Put ice in a Ziploc bag and cover with a towel and place against area 15 minutes on 15 minutes off.  You may switch to heat after 24 hours.Epidural Steroid Injection Patient Information  Description: The epidural space surrounds the nerves as they exit the spinal cord.  In some patients, the nerves can be compressed and inflamed by a bulging disc or a tight spinal canal (spinal stenosis).  By injecting steroids into the epidural space, we can bring irritated nerves into direct contact with a potentially helpful medication.  These  steroids act directly on the irritated nerves and can reduce swelling and inflammation which often leads to decreased pain.  Epidural steroids may be injected anywhere along the spine and from the neck to the low back depending upon the location of your pain.   After numbing the skin with local anesthetic (like Novocaine), a small needle is passed into the epidural space slowly.  You may experience a sensation of pressure while this is being done.  The entire block usually last less than 10 minutes.  Conditions which may be treated by epidural steroids:  Low back and leg pain Neck and arm pain Spinal stenosis Post-laminectomy syndrome Herpes zoster (shingles) pain Pain from compression fractures  Preparation for the injection:  Do not eat any solid food or dairy products within 8 hours of your appointment.  You may drink clear liquids up to 3 hours before appointment.  Clear liquids include water, black coffee, juice or soda.  No milk or cream please. You may take your regular medication, including pain medications, with a sip of water before your appointment  Diabetics should hold regular insulin (if taken separately) and take 1/2 normal NPH dos the morning of the procedure.  Carry some sugar containing items with you to your appointment. A driver must accompany you and be prepared to drive you home after your procedure.  Bring all your current medications with your. An IV may be inserted and sedation may be given at the discretion of the physician.     A blood pressure cuff, EKG and other monitors will often be applied during the procedure.  Some patients may need to have extra oxygen administered for a short period. You will be asked to provide medical information, including your allergies, prior to the procedure.  We must know immediately if you are taking blood thinners (like Coumadin/Warfarin)  Or if you are allergic to IV iodine contrast (dye). We must know if you could possible be  pregnant.  Possible side-effects: Bleeding from needle site Infection (rare, may require surgery) Nerve injury (rare) Numbness & tingling (temporary) Difficulty urinating (rare, temporary) Spinal headache ( a headache worse with upright posture) Light -headedness (temporary) Pain at injection site (several days) Decreased blood pressure (temporary) Weakness in arm/leg (temporary) Pressure sensation in back/neck (temporary)  Call if you experience: Fever/chills associated with headache or increased back/neck pain. Headache worsened by an upright position. New onset weakness or numbness of an extremity below the injection site Hives or difficulty breathing (go to the emergency room) Inflammation or drainage at the infection site Severe back/neck pain Any new symptoms which are concerning to you  Please note:  Although the local anesthetic injected can often make your back or neck feel good for several hours after the injection, the pain will likely return.  It takes 3-7 days for steroids to work in the epidural space.  You may not notice any pain relief for at least that one week.  If effective, we will often do a series of three injections spaced 3-6 weeks apart to maximally decrease your pain.  After the initial series, we generally will wait several months before considering a repeat injection of the same type.  If you have any questions, please call [phone] [facility] 

## 2022-05-29 ENCOUNTER — Telehealth: Payer: Self-pay | Admitting: *Deleted

## 2022-05-29 NOTE — Telephone Encounter (Signed)
No problems post procedure. 

## 2022-06-14 ENCOUNTER — Encounter: Payer: Self-pay | Admitting: Student in an Organized Health Care Education/Training Program

## 2022-06-20 ENCOUNTER — Telehealth: Payer: Self-pay | Admitting: *Deleted

## 2022-06-27 ENCOUNTER — Encounter: Payer: Self-pay | Admitting: *Deleted

## 2022-06-28 ENCOUNTER — Ambulatory Visit (INDEPENDENT_AMBULATORY_CARE_PROVIDER_SITE_OTHER): Payer: BC Managed Care – PPO | Admitting: Obstetrics & Gynecology

## 2022-06-28 ENCOUNTER — Encounter: Payer: Self-pay | Admitting: Obstetrics & Gynecology

## 2022-06-28 VITALS — BP 140/84 | HR 79 | Ht 61.25 in | Wt 185.0 lb

## 2022-06-28 DIAGNOSIS — N951 Menopausal and female climacteric states: Secondary | ICD-10-CM

## 2022-06-28 DIAGNOSIS — Z90710 Acquired absence of both cervix and uterus: Secondary | ICD-10-CM

## 2022-06-28 DIAGNOSIS — Z113 Encounter for screening for infections with a predominantly sexual mode of transmission: Secondary | ICD-10-CM | POA: Diagnosis not present

## 2022-06-28 DIAGNOSIS — Z01419 Encounter for gynecological examination (general) (routine) without abnormal findings: Secondary | ICD-10-CM

## 2022-06-28 NOTE — Progress Notes (Signed)
Valerie Welch 05-14-1971 [id number]   History:    51 y.o.  G5P2A3L2 Separated   RP:  Established patient presenting for annual gyn exam    HPI:  S/P XI Robotic TLH/Bilateral Salpingectomy 07/05/2020.  C/O hot flushes.  No pelvic pain. Pap Neg 02/2020.  Currently abstinent.  Husband had extramarital sex.  STI screen Neg, will complete with HBsAg today.  Urine/BMs wnl.  Breasts normal.  Screening mammo neg 02/2022, Rt Dx mammo/US 02/2022.  BMI stable 34.67.  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 Colono 2021. Health labs with Fam MD.   Past medical history,surgical history, family history and social history were all reviewed and documented in the EPIC chart.  Gynecologic History Patient's last menstrual period was 09/10/2019.  Obstetric History OB History  Gravida Para Term Preterm AB Living  '4 2 2   2 2  '$ SAB IAB Ectopic Multiple Live Births  1 1          # Outcome Date GA Lbr Len/2nd Weight Sex Delivery Anes PTL Lv  4 IAB           3 SAB           2 Term           1 Term              ROS: A ROS was performed and pertinent positives and negatives are included in the history.  GENERAL: No fevers or chills. HEENT: No change in vision, no earache, sore throat or sinus congestion. NECK: No pain or stiffness. CARDIOVASCULAR: No chest pain or pressure. No palpitations. PULMONARY: No shortness of breath, cough or wheeze. GASTROINTESTINAL: No abdominal pain, nausea, vomiting or diarrhea, melena or bright red blood per rectum. GENITOURINARY: No urinary frequency, urgency, hesitancy or dysuria. MUSCULOSKELETAL: No joint or muscle pain, no back pain, no recent trauma. DERMATOLOGIC: No rash, no itching, no lesions. ENDOCRINE: No polyuria, polydipsia, no heat or cold intolerance. No recent change in weight. HEMATOLOGICAL: No anemia or easy bruising or bleeding. NEUROLOGIC: No headache, seizures, numbness, tingling or weakness. PSYCHIATRIC: No depression, no loss  of interest in normal activity or change in sleep pattern.     Exam:   BP 140/84   Pulse 79   Ht 5' 1.25" (1.556 m)   Wt 185 lb (83.9 kg)   LMP 09/10/2019   SpO2 97%   BMI 34.67 kg/m   Body mass index is 34.67 kg/m.  General appearance : Well developed well nourished female. No acute distress HEENT: Eyes: no retinal hemorrhage or exudates,  Neck supple, trachea midline, no carotid bruits, no thyroidmegaly Lungs: Clear to auscultation, no rhonchi or wheezes, or rib retractions  Heart: Regular rate and rhythm, no murmurs or gallops Breast:Examined in sitting and supine position were symmetrical in appearance, no palpable masses or tenderness,  no skin retraction, no nipple inversion, no nipple discharge, no skin discoloration, no axillary or supraclavicular lymphadenopathy Abdomen: no palpable masses or tenderness, no rebound or guarding Extremities: no edema or skin discoloration or tenderness  Pelvic: Vulva: Normal             Vagina: No gross lesions or discharge  Cervix/Uterus absent  Adnexa  Without masses or tenderness  Anus: Normal   Assessment/Plan:  51 y.o. female for annual exam   1. Well female exam with routine gynecological exam S/P XI Robotic TLH/Bilateral Salpingectomy 07/05/2020.  C/O hot flushes.  No pelvic pain. Pap Neg 02/2020.  Currently abstinent.  Husband had extramarital sex.  STI screen Neg, will complete with HBsAg today.  Urine/BMs wnl.  Breasts normal.  Screening mammo neg 02/2022, Rt Dx mammo/US 02/2022.  BMI stable 34.67.  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 Colono 2021. Health labs with Fam MD.  2. S/P total hysterectomy  3. Hot flushes, perimenopausal Probably in menopause.  Given Fam h/o stroke and patient's cHTN, will not start on HRT at this time. - FSH  4. Screen for STD (sexually transmitted disease) All STI screen Negative, just HBsAg to complete the work up today. - Hepatitis B Surface AntiGEN    Princess Bruins MD, 8:22 AM 06/28/2022

## 2022-06-29 LAB — HEPATITIS B SURFACE ANTIGEN: Hepatitis B Surface Ag: NONREACTIVE

## 2022-06-29 LAB — FOLLICLE STIMULATING HORMONE: FSH: 12.3 m[IU]/mL

## 2022-07-04 ENCOUNTER — Ambulatory Visit
Payer: BC Managed Care – PPO | Attending: Student in an Organized Health Care Education/Training Program | Admitting: Student in an Organized Health Care Education/Training Program

## 2022-07-04 ENCOUNTER — Ambulatory Visit: Payer: BC Managed Care – PPO | Admitting: Student in an Organized Health Care Education/Training Program

## 2022-07-04 DIAGNOSIS — G894 Chronic pain syndrome: Secondary | ICD-10-CM

## 2022-07-04 DIAGNOSIS — G5793 Unspecified mononeuropathy of bilateral lower limbs: Secondary | ICD-10-CM | POA: Diagnosis not present

## 2022-07-04 DIAGNOSIS — M5416 Radiculopathy, lumbar region: Secondary | ICD-10-CM | POA: Diagnosis not present

## 2022-07-04 DIAGNOSIS — G5771 Causalgia of right lower limb: Secondary | ICD-10-CM

## 2022-07-04 NOTE — Progress Notes (Signed)
Patient: Valerie Welch  Service Category: E/M  Provider: Gillis Santa, MD  DOB: [birth year]  DOS: 07/04/2022  Location: Office  MRN: [id number]  Setting: Ambulatory outpatient  Referring Provider: Tracie Harrier, MD  Type: Established Patient  Specialty: Interventional Pain Management  PCP: Tracie Harrier, MD  Location: Remote location  Delivery: TeleHealth     Virtual Encounter - Pain Management PROVIDER NOTE: Information contained herein reflects review and annotations entered in association with encounter. Interpretation of such information and data should be left to medically-trained personnel. Information provided to patient can be located elsewhere in the medical record under "Patient Instructions". Document created using STT-dictation technology, any transcriptional errors that may result from process are unintentional.    Contact & Pharmacy Preferred: [phone] Home: [phone] (home) Mobile: [phone] (mobile) E-mail: [email or web address]  [id number]: [phone]: [phone]  Pre-screening  [name] offered "in-person" vs "virtual" encounter. She indicated preferring virtual for this encounter.   Reason COVID-19*  Social distancing based on CDC and AMA recommendations.   I contacted DLAKERIA STARKMANon 07/04/2022 via telephone.      I clearly identified myself as BGillis Santa MD. I verified that I was speaking with the correct person using two identifiers (Name: DCAELYN ROUTE and date of birth: 3June 16, 1972.  Consent I sought verbal advanced consent from DElinor Dodgefor virtual visit interactions. I informed [name] of possible security and privacy concerns, risks, and limitations associated with providing "not-in-person" medical evaluation and management services. I also informed [name] of the availability of  "in-person" appointments. Finally, I informed her that there would be a charge for the virtual visit and that she could be  personally, fully or partially, financially responsible for it. Ms. LMacalusoexpressed understanding and agreed to proceed.   Historic Elements   Ms. DDULCINEA KINSERis a 51y.o. year old, female patient evaluated today after our last contact on 05/28/2022. Ms. LCroston has a past medical history of Anemia, Arthritis, Asthma, Barrett's esophagus, Bursitis, Depression, Fibromyalgia, GERD (gastroesophageal reflux disease), History of hiatal hernia, Hypothyroidism, Nontoxic uninodular goiter, and Pernicious anemia. She also  has a past surgical history that includes Cesarean section; Cholecystectomy, laparoscopic; Ankle arthroscopy (Right, 09/26/2018); Tenotomy / flexor tendon transfer (Right, 09/26/2018); Arthrodesis metatarsalphalangeal joint (mtpj) (Right, 09/26/2018); Exostectectomy toe (Right, 09/26/2018); Cholecystectomy; Colonoscopy with propofol (N/A, 06/16/2020); Esophagogastroduodenoscopy (egd) with propofol (N/A, 06/16/2020); and Robotic assisted laparoscopic hysterectomy and salpingectomy (N/A, 07/05/2020). Ms. LAngulohas a current medication list which includes the following prescription(s): alprazolam, azelastine, bepreve, bupropion, bupropion, cetirizine, b-12, diclofenac sodium, dss, duloxetine, fluticasone-salmeterol, gabapentin, hydrocodone-acetaminophen, iron polysaccharides, levothyroxine, montelukast, multivitamin with minerals, NON FORMULARY, omeprazole, polyethylene glycol, rosuvastatin, and trolamine salicylate. She  reports that she has never smoked. She has never used smokeless tobacco. She reports current alcohol use. She reports that she does not use drugs. Ms. LPorteeis allergic to beef-derived products, other, percocet [oxycodone-acetaminophen], and pitocin [oxytocin].   HPI  Today, she is being contacted for a post-procedure  assessment.   Post-procedure evaluation    Procedure:          Anesthesia, Analgesia, Anxiolysis:  Type: Therapeutic Inter-Laminar Epidural Steroid Injection  #4 (#1: 11/27/21, #2 01/01/22, #3 02/28/22 )  Region: Lumbar Level: L4-5 Level. Laterality: Right-Sided         Anesthesia: Local (1-2% Lidocaine)  Anxiolysis: None  Sedation: None  Guidance: Fluoroscopy  Position: Prone with head of the table was raised to facilitate breathing.   Indications: 1. Lumbar radiculopathy   2. Complex regional pain syndrome type II of right lower limb   3. Neuropathic pain of right foot   4. Neuropathic pain of both legs   5. Chronic pain syndrome    Pain Score: Pre-procedure: 8 /10 Post-procedure: 8 /10     Effectiveness:  Initial hour after procedure: 0 %  Subsequent 4-6 hours post-procedure: 50 %  Analgesia past initial 6 hours: 75 % (lasting 1 week)  Ongoing improvement:  Analgesic:  <20% Function: Minimal improvement ROM: Minimal improvement   Laboratory Chemistry Profile   Renal Lab Results  Component Value Date   BUN 7 07/01/2020   CREATININE 1.00 07/01/2020   GFRAA >60 07/01/2020   GFRNONAA >60 07/01/2020    Hepatic No results found for: "AST", "ALT", "ALBUMIN", "ALKPHOS", "HCVAB", "AMYLASE", "LIPASE", "AMMONIA"  Electrolytes Lab Results  Component Value Date   NA 139 07/01/2020   K 4.5 07/01/2020   CL 108 07/01/2020   CALCIUM 9.2 07/01/2020    Bone No results found for: "VD25OH", "VD125OH2TOT", "RX5400QQ7", "YP9509TO6", "25OHVITD1", "25OHVITD2", "25OHVITD3", "TESTOFREE", "TESTOSTERONE"  Inflammation (CRP: Acute Phase) (ESR: Chronic Phase) No results found for: "CRP", "ESRSEDRATE", "LATICACIDVEN"       Note: Above Lab results reviewed.   Assessment  The primary encounter diagnosis was Lumbar radiculopathy. Diagnoses of Complex regional pain syndrome type II of right lower limb, Neuropathic pain of both legs, and Chronic pain syndrome were also pertinent  to this visit.  Plan of Care   Patient obtained mild benefit from her previous lumbar ESI.  She is having more diffuse widespread pain which is likely related to perimenopause as she is also having significant hot flashes and is waking up diaphoretic in bed.  She did have a hysterectomy in the past.  For the time being we will continue to monitor her symptoms and I will see her back in approximately 3 months for further evaluation  Future considerations include resubmission of lumbar sympathetic nerve block that was initially denied by insurance as well as spinal cord stimulator trial.  Follow-up plan:   Return in about 4 months (around 11/04/2022) for folow up (discuss LSB vs SCS).     Right lower extremity CRPS, lumbar sympathetic nerve block denied by insurance.  Right L4-5 ESI #2 01/01/22, #3 02/28/22. Consider spinal cord stimulator trial in future.        Recent Visits Date Type Provider Dept  05/28/22 Procedure visit Gillis Santa, MD [facility]  05/16/22 Office Visit Gillis Santa, MD [facility]  Showing recent visits within past 90 days and meeting all other requirements Today's Visits Date Type Provider Dept  07/04/22 Office Visit Gillis Santa, MD [facility]  Showing today's visits and meeting all other requirements Future Appointments No visits were found meeting these conditions. Showing future appointments within next 90 days and meeting all other requirements  I discussed the assessment and treatment plan with the patient. The patient was provided an opportunity to ask questions and all were answered. The patient agreed with the plan and demonstrated an understanding of the instructions.  Patient advised to call back or seek an in-person evaluation if the symptoms or condition worsens.  Duration of encounter: 24mnutes.  Note by: BGillis Santa MD Date: 07/04/2022; Time: 2:45 PM

## 2022-07-09 NOTE — Telephone Encounter (Signed)
No additional notes

## 2022-07-31 ENCOUNTER — Other Ambulatory Visit: Payer: Self-pay | Admitting: Student in an Organized Health Care Education/Training Program

## 2022-08-06 ENCOUNTER — Telehealth: Payer: Self-pay

## 2022-08-06 ENCOUNTER — Other Ambulatory Visit: Payer: Self-pay

## 2022-08-06 MED ORDER — GABAPENTIN 300 MG PO CAPS: ORAL_CAPSULE | ORAL | 5 refills | Status: DC

## 2022-08-06 NOTE — Telephone Encounter (Signed)
Dr. Holley Raring was supposed to call out her gabapentin and the pharmacy has not received it yet. She is flying out Friday and she needs it before she leaves. Please call her when done.

## 2022-08-06 NOTE — Telephone Encounter (Signed)
Refill request sent to Dr Lateef.  

## 2022-11-01 ENCOUNTER — Other Ambulatory Visit: Payer: Self-pay

## 2022-11-01 ENCOUNTER — Ambulatory Visit
Payer: BC Managed Care – PPO | Attending: Student in an Organized Health Care Education/Training Program | Admitting: Student in an Organized Health Care Education/Training Program

## 2022-11-01 ENCOUNTER — Encounter: Payer: Self-pay | Admitting: Student in an Organized Health Care Education/Training Program

## 2022-11-01 VITALS — BP 164/113 | HR 88 | Temp 97.3°F | Resp 18 | Ht 61.0 in | Wt 184.0 lb

## 2022-11-01 DIAGNOSIS — G5771 Causalgia of right lower limb: Secondary | ICD-10-CM | POA: Diagnosis not present

## 2022-11-01 DIAGNOSIS — M792 Neuralgia and neuritis, unspecified: Secondary | ICD-10-CM | POA: Diagnosis present

## 2022-11-01 DIAGNOSIS — G5793 Unspecified mononeuropathy of bilateral lower limbs: Secondary | ICD-10-CM

## 2022-11-01 DIAGNOSIS — G894 Chronic pain syndrome: Secondary | ICD-10-CM

## 2022-11-01 DIAGNOSIS — M5416 Radiculopathy, lumbar region: Secondary | ICD-10-CM | POA: Insufficient documentation

## 2022-11-01 MED ORDER — GABAPENTIN 300 MG PO CAPS: ORAL_CAPSULE | ORAL | 5 refills | Status: DC

## 2022-11-01 MED ORDER — DULOXETINE HCL 60 MG PO CPEP: 60.0000 mg | ORAL_CAPSULE | Freq: Every day | ORAL | 1 refills | Status: DC

## 2022-11-01 NOTE — Patient Instructions (Addendum)
Please allow patient to continue working from home as this is better for her chronic pain management   ______________________________________________________________________  Preparing for your procedure  During your procedure appointment there will be: No Prescription Refills. No disability issues to discussed. No medication changes or discussions.  Instructions: Food intake: Avoid eating anything solid for at least 8 hours prior to your procedure. Clear liquid intake: You may take clear liquids such as water up to 2 hours prior to your procedure. (No carbonated drinks. No soda.) Transportation: Unless otherwise stated by your physician, bring a driver. Morning Medicines: Except for blood thinners, take all of your other morning medications with a sip of water. Make sure to take your heart and blood pressure medicines. If your blood pressure's lower number is above 100, the case will be rescheduled. Blood thinners: If you take a blood thinner, but were not instructed to stop it, call our office [phone] and ask to talk to a nurse. Not stopping a blood thinner prior to certain procedures could lead to serious complications. Diabetics on insulin: Notify the staff so that you can be scheduled 1st case in the morning. If your diabetes requires high dose insulin, take only  of your normal insulin dose the morning of the procedure and notify the staff that you have done so. Preventing infections: Shower with an antibacterial soap the morning of your procedure.  Build-up your immune system: Take 1000 mg of Vitamin C with every meal (3 times a day) the day prior to your procedure. Antibiotics: Inform the nursing staff if you are taking any antibiotics or if you have any conditions that may require antibiotics prior to procedures. (Example: recent joint implants)   Pregnancy: If you are pregnant make sure to notify the nursing staff. Not doing so may result in injury to the fetus, including  death.  Sickness: If you have a cold, fever, or any active infections, call and cancel or reschedule your procedure. Receiving steroids while having an infection may result in complications. Arrival: You must be in the facility at least 30 minutes prior to your scheduled procedure. Tardiness: Your scheduled time is also the cutoff time. If you do not arrive at least 15 minutes prior to your procedure, you will be rescheduled.  Children: Do not bring any children with you. Make arrangements to keep them home. Dress appropriately: There is always a possibility that your clothing may get soiled. Avoid long dresses. Valuables: Do not bring any jewelry or valuables.  Reasons to call and reschedule or cancel your procedure: (Following these recommendations will minimize the risk of a serious complication.) Surgeries: Avoid having procedures within 2 weeks of any surgery. (Avoid for 2 weeks before or after any surgery). Flu Shots: Avoid having procedures within 2 weeks of a flu shots or . (Avoid for 2 weeks before or after immunizations). Barium: Avoid having a procedure within 7-10 days after having had a radiological study involving the use of radiological contrast. (Myelograms, Barium swallow or enema study). Heart attacks: Avoid any elective procedures or surgeries for the initial 6 months after a "Myocardial Infarction" (Heart Attack). Blood thinners: It is imperative that you stop these medications before procedures. Let us know if you if you take any blood thinner.  Infection: Avoid procedures during or within two weeks of an infection (including chest colds or gastrointestinal problems). Symptoms associated with infections include: Localized redness, fever, chills, night sweats or profuse sweating, burning sensation when voiding, cough, congestion, stuffiness, runny nose, sore throat,  diarrhea, nausea, vomiting, cold or Flu symptoms, recent or current infections. It is specially important if the  infection is over the area that we intend to treat. Heart and lung problems: Symptoms that may suggest an active cardiopulmonary problem include: cough, chest pain, breathing difficulties or shortness of breath, dizziness, ankle swelling, uncontrolled high or unusually low blood pressure, and/or palpitations. If you are experiencing any of these symptoms, cancel your procedure and contact your primary care physician for an evaluation.  Remember:  Regular Business hours are:  Monday to Thursday 8:00 AM to 4:00 PM  Provider's Schedule: Milinda Pointer, MD:  Procedure days: Tuesday and Thursday 7:30 AM to 4:00 PM  Gillis Santa, MD:  Procedure days: Monday and Wednesday 7:30 AM to 4:00 PM  ______________________________________________________________________

## 2022-11-01 NOTE — Progress Notes (Signed)
PROVIDER NOTE: Information contained herein reflects review and annotations entered in association with encounter. Interpretation of such information and data should be left to medically-trained personnel. Information provided to patient can be located elsewhere in the medical record under "Patient Instructions". Document created using STT-dictation technology, any transcriptional errors that may result from process are unintentional.    Patient: Valerie Welch  Service Category: E/M  Provider: Gillis Santa, MD  DOB: November 17, 1971  DOS: 11/01/2022  Specialty: Interventional Pain Management  MRN: [id number]  Setting: Ambulatory outpatient  PCP: Tracie Harrier, MD  Type: Established Patient    Referring Provider: Tracie Harrier, MD  Location: Office  Delivery: Face-to-face     HPI  [name], a 51 y.o. year old female, is here today because of her Lumbar radiculopathy [M54.16]. Valerie Welch primary complain today is Back Pain (low) Last encounter: My last encounter with her was on 05/05/2022. Pertinent problems: Valerie Welch has History of foot surgery; Generalized anxiety disorder; Neuropathic pain of both legs; Complex regional pain syndrome type II of right lower limb; and Neuropathic pain of right foot on their pertinent problem list. Pain Assessment: Severity of Chronic pain is reported as a 5 /10. Location: Back Lower/hips and legs. Onset: More than a month ago. Quality: Constant, Burning, Pins and needles, Throbbing, Tingling, Sharp. Timing: Constant. Modifying factor(s): moving, meds, stretching, topicals. Vitals:  height is _0  (1.549 m) and weight is 184 lb (83.5 kg). Her temporal temperature is 97.3 F (36.3 C) (abnormal). Her blood pressure is 164/113 (abnormal) and her pulse is 88. Her respiration is 18 and oxygen saturation is 99%.   Reason for encounter: medication management and worsening Right lumbar radicular pain.   Increased right lumbar radicular  pain, previously had ESI 05/28/22 at right L4/5 provided 75% pain relief for 4.5 months with gradual return now  Requesting refill of Cymbalta and gabapentin    ROS  Constitutional: Denies any fever or chills Gastrointestinal: No reported hemesis, hematochezia, vomiting, or acute GI distress Musculoskeletal:  right lumbar pain Neurological:  Return of right lower extremity radicular pain which she describes as burning and tingling  Medication Review  ALPRAZolam, Ashwagandha, B-12, Bepotastine Besilate, DSS, DULoxetine, Fluticasone-Salmeterol, HYDROcodone-Acetaminophen, NON FORMULARY, azelastine, buPROPion, cetirizine, diclofenac sodium, gabapentin, iron polysaccharides, levothyroxine, montelukast, multivitamin with minerals, omeprazole, polyethylene glycol, rosuvastatin, and trolamine salicylate  History Review  Allergy: Valerie Welch is allergic to beef-derived products, other, percocet [oxycodone-acetaminophen], and pitocin [oxytocin]. [name]  reports no history of drug use. Alcohol:  reports current alcohol use. Tobacco:  reports that she has never smoked. She has never used smokeless tobacco. Social: [name]  reports that she has never smoked. She has never used smokeless tobacco. She reports current alcohol use. She reports that she does not use drugs. Medical:  has a past medical history of Anemia, Arthritis, Asthma, Barrett's esophagus, Bursitis, Depression, Fibromyalgia, GERD (gastroesophageal reflux disease), History of hiatal hernia, Hypothyroidism, Nontoxic uninodular goiter, and Pernicious anemia. Surgical: Valerie Welch  has a past surgical history that includes Cesarean section; Cholecystectomy, laparoscopic; Ankle arthroscopy (Right, 09/26/2018); Tenotomy / flexor tendon transfer (Right, 09/26/2018); Arthrodesis metatarsalphalangeal joint (mtpj) (Right, 09/26/2018); Exostectectomy toe (Right, 09/26/2018); Cholecystectomy; Colonoscopy with propofol  (N/A, 06/16/2020); Esophagogastroduodenoscopy (egd) with propofol (N/A, 06/16/2020); and Robotic assisted laparoscopic hysterectomy and salpingectomy (N/A, 07/05/2020). Family: family history includes Cancer in her paternal grandfather; Diabetes in her mother; Heart disease in her mother; Hypertension in her maternal grandfather; Stroke in her brother, father, and mother.  Laboratory  Chemistry Profile   Renal Lab Results  Component Value Date   BUN 7 07/01/2020   CREATININE 1.00 07/01/2020   GFRAA >60 07/01/2020   GFRNONAA >60 07/01/2020    Hepatic No results found for: "AST", "ALT", "ALBUMIN", "ALKPHOS", "HCVAB", "AMYLASE", "LIPASE", "AMMONIA"  Electrolytes Lab Results  Component Value Date   NA 139 07/01/2020   K 4.5 07/01/2020   CL 108 07/01/2020   CALCIUM 9.2 07/01/2020    Bone No results found for: "VD25OH", "VD125OH2TOT", "JO8416SA6", "TK1601UX3", "25OHVITD1", "25OHVITD2", "25OHVITD3", "TESTOFREE", "TESTOSTERONE"  Inflammation (CRP: Acute Phase) (ESR: Chronic Phase) No results found for: "CRP", "ESRSEDRATE", "LATICACIDVEN"       Note: Above Lab results reviewed.  Recent Imaging Review  DG PAIN CLINIC C-ARM 1-60 MIN NO REPORT Fluoro was used, but no Radiologist interpretation will be provided.  Please refer to "NOTES" tab for provider progress note. Note: Reviewed        Physical Exam  General appearance: Well nourished, well developed, and well hydrated. In no apparent acute distress Mental status: Alert, oriented x 3 (person, place, & time)       Respiratory: No evidence of acute respiratory distress Eyes: PERLA Vitals: BP (!) 164/113   Pulse 88   Temp (!) 97.3 F (36.3 C) (Temporal)   Resp 18   Ht _0  (1.549 m)   Wt 184 lb (83.5 kg)   LMP 09/10/2019   SpO2 99%   BMI 34.77 kg/m  BMI: Estimated body mass index is 34.77 kg/m as calculated from the following:   Height as of this encounter: _1  (1.549 m).   Weight as of this encounter: 184 lb (83.5  kg). Ideal: Ideal body weight: 47.8 kg (105 lb 6.1 oz) Adjusted ideal body weight: 62.1 kg (136 lb 13.2 oz)  Lumbar Spine Area Exam  Skin & Axial Inspection: No masses, redness, or swelling Alignment: Symmetrical Functional ROM: Pain restricted ROM       Stability: No instability detected Muscle Tone/Strength: Functionally intact. No obvious neuro-muscular anomalies detected. Sensory (Neurological): Dermatomal pain pattern right L4-L5 Palpation: No palpable anomalies       Provocative Tests: Hyperextension/rotation test: deferred today       Lumbar quadrant test (Kemp's test): deferred today           Lower Extremity Exam      Side: Right lower extremity   Side: Left lower extremity  Stability: No instability observed           Stability: No instability observed          Skin & Extremity Inspection: Evidence of prior arthroplastic surgery redness, decreased hair growth, cool to touch, swelling   Skin & Extremity Inspection: Skin color, temperature, and hair growth are WNL. No peripheral edema or cyanosis. No masses, redness, swelling, asymmetry, or associated skin lesions. No contractures.  Functional ROM: Pain restricted ROM                   Functional ROM: Unrestricted ROM                  Muscle Tone/Strength: Functionally intact. No obvious neuro-muscular anomalies detected.   Muscle Tone/Strength: Functionally intact. No obvious neuro-muscular anomalies detected.  Sensory (Neurological): Neurogenic pain pattern positive straight leg raise test         Sensory (Neurological): Unimpaired        DTR: Patellar: deferred today Achilles: deferred today Plantar: deferred today   DTR: Patellar: deferred today Achilles: deferred today  Plantar: deferred today  Palpation: No palpable anomalies   Palpation: No palpable anomalies    Assessment   Diagnosis Status  1. Lumbar radiculopathy   2. Complex regional pain syndrome type II of right lower limb   3. Neuropathic pain of both  legs   4. Neuropathic pain of right foot   5. Chronic pain syndrome    Responding Controlled Controlled     Plan of Care    Ms. MYEISHA KRUSER has a current medication list which includes the following long-term medication(s): azelastine, cetirizine, iron polysaccharides, levothyroxine, montelukast, omeprazole, duloxetine, and gabapentin.  1. Lumbar radiculopathy - Lumbar Epidural Injection; Future - gabapentin (NEURONTIN) 300 MG capsule; 300 mg in the morning, 300 mg in the afternoon, 600 mg nightly  Dispense: 120 capsule; Refill: 5 - DULoxetine (CYMBALTA) 60 MG capsule; Take 1 capsule (60 mg total) by mouth daily.  Dispense: 90 capsule; Refill: 1  2. Complex regional pain syndrome type II of right lower limb - gabapentin (NEURONTIN) 300 MG capsule; 300 mg in the morning, 300 mg in the afternoon, 600 mg nightly  Dispense: 120 capsule; Refill: 5 - DULoxetine (CYMBALTA) 60 MG capsule; Take 1 capsule (60 mg total) by mouth daily.  Dispense: 90 capsule; Refill: 1  3. Neuropathic pain of both legs - Lumbar Epidural Injection; Future - gabapentin (NEURONTIN) 300 MG capsule; 300 mg in the morning, 300 mg in the afternoon, 600 mg nightly  Dispense: 120 capsule; Refill: 5 - DULoxetine (CYMBALTA) 60 MG capsule; Take 1 capsule (60 mg total) by mouth daily.  Dispense: 90 capsule; Refill: 1  4. Neuropathic pain of right foot - gabapentin (NEURONTIN) 300 MG capsule; 300 mg in the morning, 300 mg in the afternoon, 600 mg nightly  Dispense: 120 capsule; Refill: 5 - DULoxetine (CYMBALTA) 60 MG capsule; Take 1 capsule (60 mg total) by mouth daily.  Dispense: 90 capsule; Refill: 1  5. Chronic pain syndrome - Lumbar Epidural Injection; Future - gabapentin (NEURONTIN) 300 MG capsule; 300 mg in the morning, 300 mg in the afternoon, 600 mg nightly  Dispense: 120 capsule; Refill: 5 - DULoxetine (CYMBALTA) 60 MG capsule; Take 1 capsule (60 mg total) by mouth daily.  Dispense: 90 capsule; Refill:  1   Pharmacotherapy (Medications Ordered): Meds ordered this encounter  Medications   gabapentin (NEURONTIN) 300 MG capsule    Sig: 300 mg in the morning, 300 mg in the afternoon, 600 mg nightly    Dispense:  120 capsule    Refill:  5   DULoxetine (CYMBALTA) 60 MG capsule    Sig: Take 1 capsule (60 mg total) by mouth daily.    Dispense:  90 capsule    Refill:  1   Orders:  Orders Placed This Encounter  Procedures   Lumbar Epidural Injection    Standing Status:   Future    Standing Expiration Date:   02/01/2023    Scheduling Instructions:     Procedure: Interlaminar Lumbar Epidural Steroid injection (LESI)            Laterality: Right L4-5     Sedation: Patient's choice.     Timeframe: ASAA    Order Specific Question:   Where will this procedure be performed?    Answer:   [facility]   Follow-up plan:   Return in about 6 days (around 11/07/2022) for Right L4/5 ESI , in clinic [facility].     Right lower extremity CRPS, lumbar sympathetic nerve block denied by insurance.  Right L4-5 ESI #2 01/01/22, #3 02/28/22. Consider spinal cord stimulator trial in future.       Recent Visits No visits were found meeting these conditions. Showing recent visits within past 90 days and meeting all other requirements Today's Visits Date Type Provider Dept  11/01/22 Office Visit Gillis Santa, MD [facility]  Showing today's visits and meeting all other requirements Future Appointments No visits were found meeting these conditions. Showing future appointments within next 90 days and meeting all other requirements  I discussed the assessment and treatment plan with the patient. The patient was provided an opportunity to ask questions and all were answered. The patient agreed with the plan and demonstrated an understanding of the instructions.  Patient advised to call back or seek an in-person evaluation if the symptoms or condition worsens.  Duration of encounter: 30  minutes.  Note by: Gillis Santa, MD Date: 11/01/2022; Time: 1:44 PM

## 2022-11-21 ENCOUNTER — Ambulatory Visit
Admission: RE | Admit: 2022-11-21 | Discharge: 2022-11-21 | Disposition: A | Discharge status: Home / Self Care | Payer: BC Managed Care – PPO | Source: Ambulatory Visit | Attending: Student in an Organized Health Care Education/Training Program | Admitting: Student in an Organized Health Care Education/Training Program

## 2022-11-21 ENCOUNTER — Encounter: Payer: Self-pay | Admitting: Student in an Organized Health Care Education/Training Program

## 2022-11-21 ENCOUNTER — Ambulatory Visit
Payer: BC Managed Care – PPO | Attending: Student in an Organized Health Care Education/Training Program | Admitting: Student in an Organized Health Care Education/Training Program

## 2022-11-21 VITALS — BP 147/117 | HR 74 | Temp 97.2°F | Resp 18 | Ht 61.0 in | Wt 184.0 lb

## 2022-11-21 DIAGNOSIS — M5416 Radiculopathy, lumbar region: Secondary | ICD-10-CM | POA: Insufficient documentation

## 2022-11-21 DIAGNOSIS — M792 Neuralgia and neuritis, unspecified: Secondary | ICD-10-CM | POA: Diagnosis present

## 2022-11-21 MED ORDER — DIAZEPAM 5 MG PO TABS
5.0000 mg | ORAL_TABLET | ORAL | Status: AC
  Administered 2022-11-21: 5 mg via ORAL

## 2022-11-21 MED ORDER — LIDOCAINE HCL 2 % IJ SOLN
20.0000 mL | Freq: Once | INTRAMUSCULAR | Status: AC
  Administered 2022-11-21: 400 mg
  Filled 2022-11-21: qty 40

## 2022-11-21 MED ORDER — IOHEXOL 180 MG/ML  SOLN
10.0000 mL | Freq: Once | INTRAMUSCULAR | Status: AC
  Administered 2022-11-21: 10 mL via EPIDURAL
  Filled 2022-11-21: qty 20

## 2022-11-21 MED ORDER — ROPIVACAINE HCL 2 MG/ML IJ SOLN
2.0000 mL | Freq: Once | INTRAMUSCULAR | Status: AC
  Administered 2022-11-21: 2 mL via EPIDURAL
  Filled 2022-11-21: qty 20

## 2022-11-21 MED ORDER — DEXAMETHASONE SODIUM PHOSPHATE 10 MG/ML IJ SOLN
10.0000 mg | Freq: Once | INTRAMUSCULAR | Status: AC
  Administered 2022-11-21: 10 mg
  Filled 2022-11-21: qty 1

## 2022-11-21 MED ORDER — SODIUM CHLORIDE 0.9% FLUSH
2.0000 mL | Freq: Once | INTRAVENOUS | Status: AC
  Administered 2022-11-21: 2 mL

## 2022-11-21 NOTE — Addendum Note (Signed)
Addended by: Gillis Santa on: 11/21/2022 01:45 PM   Modules accepted: Orders

## 2022-11-21 NOTE — Addendum Note (Signed)
Addended by: Leotis Shames F on: 11/21/2022 01:47 PM   Modules accepted: Orders

## 2022-11-21 NOTE — Patient Instructions (Signed)
Pain Management Discharge Instructions  General Discharge Instructions :  If you need to reach your doctor call: Monday-Friday 8:00 am - 4:00 pm at [phone] or toll free [phone].  After clinic hours [phone] to have operator reach doctor.  Bring all of your medication bottles to all your appointments in the pain clinic.  To cancel or reschedule your appointment with Pain Management please remember to call 24 hours in advance to avoid a fee.  Refer to the educational materials which you have been given on: General Risks, I had my Procedure. Discharge Instructions, Post Sedation.  Post Procedure Instructions:  The drugs you were given will stay in your system until tomorrow, so for the next 24 hours you should not drive, make any legal decisions or drink any alcoholic beverages.  You may eat anything you prefer, but it is better to start with liquids then soups and crackers, and gradually work up to solid foods.  Please notify your doctor immediately if you have any unusual bleeding, trouble breathing or pain that is not related to your normal pain.  Depending on the type of procedure that was done, some parts of your body may feel week and/or numb.  This usually clears up by tonight or the next day.  Walk with the use of an assistive device or accompanied by an adult for the 24 hours.  You may use ice on the affected area for the first 24 hours.  Put ice in a Ziploc bag and cover with a towel and place against area 15 minutes on 15 minutes off.  You may switch to heat after 24 hours.Epidural Steroid Injection Patient Information  Description: The epidural space surrounds the nerves as they exit the spinal cord.  In some patients, the nerves can be compressed and inflamed by a bulging disc or a tight spinal canal (spinal stenosis).  By injecting steroids into the epidural space, we can bring irritated nerves into direct contact with a potentially helpful medication.  These  steroids act directly on the irritated nerves and can reduce swelling and inflammation which often leads to decreased pain.  Epidural steroids may be injected anywhere along the spine and from the neck to the low back depending upon the location of your pain.   After numbing the skin with local anesthetic (like Novocaine), a small needle is passed into the epidural space slowly.  You may experience a sensation of pressure while this is being done.  The entire block usually last less than 10 minutes.  Conditions which may be treated by epidural steroids:  Low back and leg pain Neck and arm pain Spinal stenosis Post-laminectomy syndrome Herpes zoster (shingles) pain Pain from compression fractures  Preparation for the injection:  Do not eat any solid food or dairy products within 8 hours of your appointment.  You may drink clear liquids up to 3 hours before appointment.  Clear liquids include water, black coffee, juice or soda.  No milk or cream please. You may take your regular medication, including pain medications, with a sip of water before your appointment  Diabetics should hold regular insulin (if taken separately) and take 1/2 normal NPH dos the morning of the procedure.  Carry some sugar containing items with you to your appointment. A driver must accompany you and be prepared to drive you home after your procedure.  Bring all your current medications with your. An IV may be inserted and sedation may be given at the discretion of the physician.     A blood pressure cuff, EKG and other monitors will often be applied during the procedure.  Some patients may need to have extra oxygen administered for a short period. You will be asked to provide medical information, including your allergies, prior to the procedure.  We must know immediately if you are taking blood thinners (like Coumadin/Warfarin)  Or if you are allergic to IV iodine contrast (dye). We must know if you could possible be  pregnant.  Possible side-effects: Bleeding from needle site Infection (rare, may require surgery) Nerve injury (rare) Numbness & tingling (temporary) Difficulty urinating (rare, temporary) Spinal headache ( a headache worse with upright posture) Light -headedness (temporary) Pain at injection site (several days) Decreased blood pressure (temporary) Weakness in arm/leg (temporary) Pressure sensation in back/neck (temporary)  Call if you experience: Fever/chills associated with headache or increased back/neck pain. Headache worsened by an upright position. New onset weakness or numbness of an extremity below the injection site Hives or difficulty breathing (go to the emergency room) Inflammation or drainage at the infection site Severe back/neck pain Any new symptoms which are concerning to you  Please note:  Although the local anesthetic injected can often make your back or neck feel good for several hours after the injection, the pain will likely return.  It takes 3-7 days for steroids to work in the epidural space.  You may not notice any pain relief for at least that one week.  If effective, we will often do a series of three injections spaced 3-6 weeks apart to maximally decrease your pain.  After the initial series, we generally will wait several months before considering a repeat injection of the same type.  If you have any questions, please call [phone] [facility] 

## 2022-11-21 NOTE — Progress Notes (Signed)
Safety precautions to be maintained throughout the outpatient stay will include: orient to surroundings, keep bed in low position, maintain call bell within reach at all times, provide assistance with transfer out of bed and ambulation.  

## 2022-11-21 NOTE — Progress Notes (Signed)
PROVIDER NOTE: Information contained herein reflects review and annotations entered in association with encounter. Interpretation of such information and data should be left to medically-trained personnel. Information provided to patient can be located elsewhere in the medical record under "Patient Instructions". Document created using STT-dictation technology, any transcriptional errors that may result from process are unintentional.    Patient: Valerie Welch  Service Category: Procedure Provider: Gillis Santa, MD DOB: 02/28/1971 DOS: 11/21/2022 Location: [facility] MRN: [id number] Setting: Ambulatory - outpatient Referring Provider: Tracie Harrier, MD Type: Established Patient Specialty: Interventional Pain Management PCP: Tracie Harrier, MD  Primary Reason for Visit: Interventional Pain Management Treatment. CC: Back Pain (Lumbar right is worse )    Procedure:          Anesthesia, Analgesia, Anxiolysis:  Type: Therapeutic Inter-Laminar Epidural Steroid Injection  #5 (#1: 11/27/21, #2 01/01/22, #3 02/28/22, #4 05/28/22)  Region: Lumbar Level: L4-5 Level. Laterality: Right-Sided         Anesthesia: Local (1-2% Lidocaine)  Anxiolysis: PO Valium 5 mg Guidance: Fluoroscopy           Position: Prone with head of the table was raised to facilitate breathing.   Indications: 1. Lumbar radiculopathy   2. Neuropathic pain of right foot     Pain Score: Pre-procedure: 6 /10 Post-procedure: 6 /10    Pre-op H&P Assessment:  [name] is a 51 y.o. (year old), female patient, seen today for interventional treatment. She  has a past surgical history that includes Cesarean section; Cholecystectomy, laparoscopic; Ankle arthroscopy (Right, 09/26/2018); Tenotomy / flexor tendon transfer (Right, 09/26/2018); Arthrodesis metatarsalphalangeal joint (mtpj) (Right, 09/26/2018); Exostectectomy toe (Right, 09/26/2018); Cholecystectomy; Colonoscopy with propofol (N/A, 06/16/2020);  Esophagogastroduodenoscopy (egd) with propofol (N/A, 06/16/2020); and Robotic assisted laparoscopic hysterectomy and salpingectomy (N/A, 07/05/2020). [name] has a current medication list which includes the following prescription(s): alprazolam, ashwagandha, azelastine, bepreve, bupropion, cetirizine, b-12, diclofenac sodium, dss, duloxetine, fluticasone-salmeterol, gabapentin, hydrocodone-acetaminophen, iron polysaccharides, levothyroxine, montelukast, multivitamin with minerals, NON FORMULARY, omeprazole, polyethylene glycol, rosuvastatin, trolamine salicylate, and bupropion. Her primarily concern today is the Back Pain (Lumbar right is worse )   Initial Vital Signs:  Pulse/HCG Rate: 74ECG Heart Rate: 78 Temp:  (!) 97.2 F (36.2 C) Resp: 16 BP:  (!) 175/116 SpO2: 100 %  BMI: Estimated body mass index is 34.77 kg/m as calculated from the following:   Height as of this encounter: '5\' 1"'$  (1.549 m).   Weight as of this encounter: 184 lb (83.5 kg).  Risk Assessment: Allergies: Reviewed. She is allergic to beef-derived products, other, percocet [oxycodone-acetaminophen], and pitocin [oxytocin].  Allergy Precautions: None required Coagulopathies: Reviewed. None identified.  Blood-thinner therapy: None at this time Active Infection(s): Reviewed. None identified. Ms. Stream is afebrile  Site Confirmation: [name] was asked to confirm the procedure and laterality before marking the site Procedure checklist: Completed Consent: Before the procedure and under the influence of no sedative(s), amnesic(s), or anxiolytics, the patient was informed of the treatment options, risks and possible complications. To fulfill our ethical and legal obligations, as recommended by the American Medical Association's Code of Ethics, I have informed the patient of my clinical impression; the nature and purpose of the treatment or procedure; the risks, benefits, and possible complications of the  intervention; the alternatives, including doing nothing; the risk(s) and benefit(s) of the alternative treatment(s) or procedure(s); and the risk(s) and benefit(s) of doing nothing. The patient was provided information about the general risks and possible complications associated with the procedure. These may  include, but are not limited to: failure to achieve desired goals, infection, bleeding, organ or nerve damage, allergic reactions, paralysis, and death. In addition, the patient was informed of those risks and complications associated to Spine-related procedures, such as failure to decrease pain; infection (i.e.: Meningitis, epidural or intraspinal abscess); bleeding (i.e.: epidural hematoma, subarachnoid hemorrhage, or any other type of intraspinal or peri-dural bleeding); organ or nerve damage (i.e.: Any type of peripheral nerve, nerve root, or spinal cord injury) with subsequent damage to sensory, motor, and/or autonomic systems, resulting in permanent pain, numbness, and/or weakness of one or several areas of the body; allergic reactions; (i.e.: anaphylactic reaction); and/or death. Furthermore, the patient was informed of those risks and complications associated with the medications. These include, but are not limited to: allergic reactions (i.e.: anaphylactic or anaphylactoid reaction(s)); adrenal axis suppression; blood sugar elevation that in diabetics may result in ketoacidosis or comma; water retention that in patients with history of congestive heart failure may result in shortness of breath, pulmonary edema, and decompensation with resultant heart failure; weight gain; swelling or edema; medication-induced neural toxicity; particulate matter embolism and blood vessel occlusion with resultant organ, and/or nervous system infarction; and/or aseptic necrosis of one or more joints. Finally, the patient was informed that Medicine is not an exact science; therefore, there is also the possibility of  unforeseen or unpredictable risks and/or possible complications that may result in a catastrophic outcome. The patient indicated having understood very clearly. We have given the patient no guarantees and we have made no promises. Enough time was given to the patient to ask questions, all of which were answered to the patient's satisfaction. Ms. Acocella has indicated that she wanted to continue with the procedure. Attestation: I, the ordering provider, attest that I have discussed with the patient the benefits, risks, side-effects, alternatives, likelihood of achieving goals, and potential problems during recovery for the procedure that I have provided informed consent. Date  Time: 11/21/2022 10:03 AM  Pre-Procedure Preparation:  Monitoring: As per clinic protocol. Respiration, ETCO2, SpO2, BP, heart rate and rhythm monitor placed and checked for adequate function Safety Precautions: Patient was assessed for positional comfort and pressure points before starting the procedure. Time-out: I initiated and conducted the "Time-out" before starting the procedure, as per protocol. The patient was asked to participate by confirming the accuracy of the "Time Out" information. Verification of the correct person, site, and procedure were performed and confirmed by me, the nursing staff, and the patient. "Time-out" conducted as per Joint Commission's Universal Protocol (UP.01.01.01). Time: 1106  Description of Procedure:          Target Area: The interlaminar space, initially targeting the lower laminar border of the superior vertebral body. Approach: Paramedial approach. Area Prepped: Entire Posterior Lumbar Region DuraPrep (Iodine Povacrylex [0.7% available iodine] and Isopropyl Alcohol, 74% w/w) Safety Precautions: Aspiration looking for blood return was conducted prior to all injections. At no point did we inject any substances, as a needle was being advanced. No attempts were made at seeking any  paresthesias. Safe injection practices and needle disposal techniques used. Medications properly checked for expiration dates. SDV (single dose vial) medications used. Description of the Procedure: Protocol guidelines were followed. The procedure needle was introduced through the skin, ipsilateral to the reported pain, and advanced to the target area. Bone was contacted and the needle walked caudad, until the lamina was cleared. The epidural space was identified using "loss-of-resistance technique" with 2-3 ml of PF-NaCl (0.9% NSS), in a 5cc LOR  glass syringe.  Vitals:   11/21/22 1008 11/21/22 1102 11/21/22 1108 11/21/22 1111  BP: (!) 153/118 (!) 165/108 (!) 159/114 (!) 147/117  Pulse:      Resp:  '18 16 18  '$ Temp:      TempSrc:      SpO2:  99% 99% 99%  Weight:      Height:          Start Time: 1106 hrs. End Time: 1109 hrs.  Materials:  Needle(s) Type: Epidural needle Gauge: 22G Length: 3.5-in Medication(s): Please see orders for medications and dosing details.  6 cc solution made of 3 cc of preservative-free saline, 2 cc of 0.2% ropivacaine, 1 cc of Decadron 10 mg/cc.  Imaging Guidance (Spinal):          Type of Imaging Technique: Fluoroscopy Guidance (Spinal) Indication(s): Assistance in needle guidance and placement for procedures requiring needle placement in or near specific anatomical locations not easily accessible without such assistance. Exposure Time: Please see nurses notes. Contrast: Before injecting any contrast, we confirmed that the patient did not have an allergy to iodine, shellfish, or radiological contrast. Once satisfactory needle placement was completed at the desired level, radiological contrast was injected. Contrast injected under live fluoroscopy. No contrast complications. See chart for type and volume of contrast used. Fluoroscopic Guidance: I was personally present during the use of fluoroscopy. "Tunnel Vision Technique" used to obtain the best possible view  of the target area. Parallax error corrected before commencing the procedure. "Direction-depth-direction" technique used to introduce the needle under continuous pulsed fluoroscopy. Once target was reached, antero-posterior, oblique, and lateral fluoroscopic projection used confirm needle placement in all planes. Images permanently stored in EMR. Interpretation: I personally interpreted the imaging intraoperatively. Adequate needle placement confirmed in multiple planes. Appropriate spread of contrast into desired area was observed. No evidence of afferent or efferent intravascular uptake. No intrathecal or subarachnoid spread observed. Permanent images saved into the patient's record.   Post-operative Assessment:  Post-procedure Vital Signs:  Pulse/HCG Rate: 7474 Temp:  (!) 97.2 F (36.2 C) Resp: 18 BP:  (!) 147/117 SpO2: 99 %  EBL: None  Complications: No immediate post-treatment complications observed by team, or reported by patient.  Note: The patient tolerated the entire procedure well. A repeat set of vitals were taken after the procedure and the patient was kept under observation following institutional policy, for this type of procedure. Post-procedural neurological assessment was performed, showing return to baseline, prior to discharge. The patient was provided with post-procedure discharge instructions, including a section on how to identify potential problems. Should any problems arise concerning this procedure, the patient was given instructions to immediately contact us, at any time, without hesitation. In any case, we plan to contact the patient by telephone for a follow-up status report regarding this interventional procedure.  Comments:  No additional relevant information.  Plan of Care  Orders:  Orders Placed This Encounter  Procedures   [facility] C-ARM 1-60 MIN NO REPORT    Intraoperative interpretation by procedural physician at [facility].     Standing Status:   Standing    Number of Occurrences:   1    Order Specific Question:   Reason for exam:    Answer:   Assistance in needle guidance and placement for procedures requiring needle placement in or near specific anatomical locations not easily accessible without such assistance.    Medications ordered for procedure: Meds ordered this encounter  Medications   iohexol (OMNIPAQUE) 180 MG/ML  injection 10 mL    Must be Myelogram-compatible. If not available, you may substitute with a water-soluble, non-ionic, hypoallergenic, myelogram-compatible radiological contrast medium.   lidocaine (XYLOCAINE) 2 % (with pres) injection 400 mg   ropivacaine (PF) 2 mg/mL (0.2%) (NAROPIN) injection 2 mL   sodium chloride flush (NS) 0.9 % injection 2 mL   dexamethasone (DECADRON) injection 10 mg   diazepam (VALIUM) tablet 5 mg    Make sure Flumazenil is available in the pyxis when using this medication. If oversedation occurs, administer 0.2 mg IV over 15 sec. If after 45 sec no response, administer 0.2 mg again over 1 min; may repeat at 1 min intervals; not to exceed 4 doses (1 mg)   Medications administered: We administered iohexol, lidocaine, ropivacaine (PF) 2 mg/mL (0.2%), sodium chloride flush, dexamethasone, and diazepam.  See the medical record for exact dosing, route, and time of administration.  Follow-up plan:   Return in about 5 weeks (around 12/26/2022), or F2F PPE.     Recent Visits Date Type Provider Dept  11/01/22 Office Visit Gillis Santa, MD [facility]  Showing recent visits within past 90 days and meeting all other requirements Today's Visits Date Type Provider Dept  11/21/22 Procedure visit Gillis Santa, MD [facility]  Showing today's visits and meeting all other requirements Future Appointments No visits were found meeting these conditions. Showing future appointments within next 90 days and meeting all other requirements  Disposition: Discharge  home  Discharge (Date  Time): 11/21/2022; 1125 hrs.   Primary Care Physician: Tracie Harrier, MD Location: [facility] Note by: Gillis Santa, MD Date: 11/21/2022; Time: 11:20 AM  Disclaimer:  Medicine is not an exact science. The only guarantee in medicine is that nothing is guaranteed. It is important to note that the decision to proceed with this intervention was based on the information collected from the patient. The Data and conclusions were drawn from the patient's questionnaire, the interview, and the physical examination. Because the information was provided in large part by the patient, it cannot be guaranteed that it has not been purposely or unconsciously manipulated. Every effort has been made to obtain as much relevant data as possible for this evaluation. It is important to note that the conclusions that lead to this procedure are derived in large part from the available data. Always take into account that the treatment will also be dependent on availability of resources and existing treatment guidelines, considered by other Pain Management Practitioners as being common knowledge and practice, at the time of the intervention. For Medico-Legal purposes, it is also important to point out that variation in procedural techniques and pharmacological choices are the acceptable norm. The indications, contraindications, technique, and results of the above procedure should only be interpreted and judged by a Board-Certified Interventional Pain Specialist with extensive familiarity and expertise in the same exact procedure and technique.

## 2023-01-02 ENCOUNTER — Encounter: Payer: Self-pay | Admitting: Student in an Organized Health Care Education/Training Program

## 2023-01-02 ENCOUNTER — Ambulatory Visit
Payer: BC Managed Care – PPO | Attending: Student in an Organized Health Care Education/Training Program | Admitting: Student in an Organized Health Care Education/Training Program

## 2023-01-02 VITALS — BP 146/105 | HR 83 | Temp 97.3°F | Resp 16 | Ht 61.0 in | Wt 183.0 lb

## 2023-01-02 DIAGNOSIS — G894 Chronic pain syndrome: Secondary | ICD-10-CM | POA: Diagnosis not present

## 2023-01-02 DIAGNOSIS — M5416 Radiculopathy, lumbar region: Secondary | ICD-10-CM | POA: Diagnosis not present

## 2023-01-02 DIAGNOSIS — M47816 Spondylosis without myelopathy or radiculopathy, lumbar region: Secondary | ICD-10-CM | POA: Diagnosis not present

## 2023-01-02 DIAGNOSIS — G5793 Unspecified mononeuropathy of bilateral lower limbs: Secondary | ICD-10-CM | POA: Insufficient documentation

## 2023-01-02 DIAGNOSIS — G5771 Causalgia of right lower limb: Secondary | ICD-10-CM | POA: Insufficient documentation

## 2023-01-02 DIAGNOSIS — M792 Neuralgia and neuritis, unspecified: Secondary | ICD-10-CM | POA: Diagnosis not present

## 2023-01-02 NOTE — Progress Notes (Signed)
Safety precautions to be maintained throughout the outpatient stay will include: orient to surroundings, keep bed in low position, maintain call bell within reach at all times, provide assistance with transfer out of bed and ambulation.  

## 2023-01-02 NOTE — Patient Instructions (Signed)
Nothing by mouth x 8 hours  Bring a driver    ____________________________________________________________________________________________  General Risks and Possible Complications  Patient Responsibilities: It is important that you read this as it is part of your informed consent. It is our duty to inform you of the risks and possible complications associated with treatments offered to you. It is your responsibility as a patient to read this and to ask questions about anything that is not clear or that you believe was not covered in this document.  Patient's Rights: You have the right to refuse treatment. You also have the right to change your mind, even after initially having agreed to have the treatment done. However, under this last option, if you wait until the last second to change your mind, you may be charged for the materials used up to that point.  Introduction: Medicine is not an Chief Strategy Officer. Everything in Medicine, including the lack of treatment(s), carries the potential for danger, harm, or loss (which is by definition: Risk). In Medicine, a complication is a secondary problem, condition, or disease that can aggravate an already existing one. All treatments carry the risk of possible complications. The fact that a side effects or complications occurs, does not imply that the treatment was conducted incorrectly. It must be clearly understood that these can happen even when everything is done following the highest safety standards.  No treatment: You can choose not to proceed with the proposed treatment alternative. The "PRO(s)" would include: avoiding the risk of complications associated with the therapy. The "CON(s)" would include: not getting any of the treatment benefits. These benefits fall under one of three categories: diagnostic; therapeutic; and/or palliative. Diagnostic benefits include: getting information which can ultimately lead to improvement of the disease or symptom(s).  Therapeutic benefits are those associated with the successful treatment of the disease. Finally, palliative benefits are those related to the decrease of the primary symptoms, without necessarily curing the condition (example: decreasing the pain from a flare-up of a chronic condition, such as incurable terminal cancer).  General Risks and Complications: These are associated to most interventional treatments. They can occur alone, or in combination. They fall under one of the following six (6) categories: no benefit or worsening of symptoms; bleeding; infection; nerve damage; allergic reactions; and/or death. No benefits or worsening of symptoms: In Medicine there are no guarantees, only probabilities. No healthcare provider can ever guarantee that a medical treatment will work, they can only state the probability that it may. Furthermore, there is always the possibility that the condition may worsen, either directly, or indirectly, as a consequence of the treatment. Bleeding: This is more common if the patient is taking a blood thinner, either prescription or over the counter (example: Goody Powders, Fish oil, Aspirin, Garlic, etc.), or if suffering a condition associated with impaired coagulation (example: Hemophilia, cirrhosis of the liver, low platelet counts, etc.). However, even if you do not have one on these, it can still happen. If you have any of these conditions, or take one of these drugs, make sure to notify your treating physician. Infection: This is more common in patients with a compromised immune system, either due to disease (example: diabetes, cancer, human immunodeficiency virus [HIV], etc.), or due to medications or treatments (example: therapies used to treat cancer and rheumatological diseases). However, even if you do not have one on these, it can still happen. If you have any of these conditions, or take one of these drugs, make sure to notify your treating  physician. Nerve Damage:  This is more common when the treatment is an invasive one, but it can also happen with the use of medications, such as those used in the treatment of cancer. The damage can occur to small secondary nerves, or to large primary ones, such as those in the spinal cord and brain. This damage may be temporary or permanent and it may lead to impairments that can range from temporary numbness to permanent paralysis and/or brain death. Allergic Reactions: Any time a substance or material comes in contact with our body, there is the possibility of an allergic reaction. These can range from a mild skin rash (contact dermatitis) to a severe systemic reaction (anaphylactic reaction), which can result in death. Death: In general, any medical intervention can result in death, most of the time due to an unforeseen complication. ____________________________________________________________________________________________   Facet Blocks Patient Information  Description: The facets are joints in the spine between the vertebrae.  Like any joints in the body, facets can become irritated and painful.  Arthritis can also effect the facets.  By injecting steroids and local anesthetic in and around these joints, we can temporarily block the nerve supply to them.  Steroids act directly on irritated nerves and tissues to reduce selling and inflammation which often leads to decreased pain.  Facet blocks may be done anywhere along the spine from the neck to the low back depending upon the location of your pain.   After numbing the skin with local anesthetic (like Novocaine), a small needle is passed onto the facet joints under x-ray guidance.  You may experience a sensation of pressure while this is being done.  The entire block usually lasts about 15-25 minutes.   Conditions which may be treated by facet blocks:  Low back/buttock pain Neck/shoulder pain Certain types of headaches  Preparation for the injection:  Do not eat  any solid food or dairy products within 8 hours of your appointment. You may drink clear liquid up to 3 hours before appointment.  Clear liquids include water, black coffee, juice or soda.  No milk or cream please. You may take your regular medication, including pain medications, with a sip of water before your appointment.  Diabetics should hold regular insulin (if taken separately) and take 1/2 normal NPH dose the morning of the procedure.  Carry some sugar containing items with you to your appointment. A driver must accompany you and be prepared to drive you home after your procedure. Bring all your current medications with you. An IV may be inserted and sedation may be given at the discretion of the physician. A blood pressure cuff, EKG and other monitors will often be applied during the procedure.  Some patients may need to have extra oxygen administered for a short period. You will be asked to provide medical information, including your allergies and medications, prior to the procedure.  We must know immediately if you are taking blood thinners (like Coumadin/Warfarin) or if you are allergic to IV iodine contrast (dye).  We must know if you could possible be pregnant.  Possible side-effects:  Bleeding from needle site Infection (rare, may require surgery) Nerve injury (rare) Numbness & tingling (temporary) Difficulty urinating (rare, temporary) Spinal headache (a headache worse with upright posture) Light-headedness (temporary) Pain at injection site (serveral days) Decreased blood pressure (rare, temporary) Weakness in arm/leg (temporary) Pressure sensation in back/neck (temporary)   Call if you experience:  Fever/chills associated with headache or increased back/neck pain Headache worsened by an  upright position New onset, weakness or numbness of an extremity below the injection site Hives or difficulty breathing (go to the emergency room) Inflammation or drainage at the  injection site(s) Severe back/neck pain greater than usual New symptoms which are concerning to you  Please note:  Although the local anesthetic injected can often make your back or neck feel good for several hours after the injection, the pain will likely return. It takes 3-7 days for steroids to work.  You may not notice any pain relief for at least one week.  If effective, we will often do a series of 2-3 injections spaced 3-6 weeks apart to maximally decrease your pain.  After the initial series, you may be a candidate for a more permanent nerve block of the facets.  If you have any questions, please call #[phone]) [facility]

## 2023-01-02 NOTE — Progress Notes (Signed)
PROVIDER NOTE: Information contained herein reflects review and annotations entered in association with encounter. Interpretation of such information and data should be left to medically-trained personnel. Information provided to patient can be located elsewhere in the medical record under "Patient Instructions". Document created using STT-dictation technology, any transcriptional errors that may result from process are unintentional.    Patient: Valerie Welch  Service Category: E/M  Provider: Gillis Santa, MD  DOB: Mar 14, 1971  DOS: 01/02/2023  Specialty: Interventional Pain Management  MRN: [id number]  Setting: Ambulatory outpatient  PCP: Tracie Harrier, MD  Type: Established Patient    Referring Provider: Tracie Harrier, MD  Location: Office  Delivery: Face-to-face     HPI  Valerie Welch, a 52 y.o. year old female, is here today because of her Lumbar facet arthropathy [M47.816]. [name] primary complain today is Back Pain and Neck Pain (Shoulders bilateral having headaches) Last encounter: My last encounter with her was on 11/21/22 Pertinent problems: [name] has History of foot surgery; Generalized anxiety disorder; Neuropathic pain of both legs; Complex regional pain syndrome type II of right lower limb; and Neuropathic pain of right foot on their pertinent problem list. Pain Assessment: Severity of Chronic pain is reported as a 4 /10. Location: Back Lower/hips bilateral  down back of leg to knee. Onset:  . Quality:  . Timing:  . Modifying factor(s):  Marland Kitchen Vitals:  height is '5\' 1"'$  (1.549 m) and weight is 183 lb (83 kg). Her temperature is 97.3 F (36.3 C) (abnormal). Her blood pressure is 146/105 (abnormal) and her pulse is 83. Her respiration is 16 and oxygen saturation is 99%.   Reason for encounter: post-procedure evaluation and assessment   Post-procedure evaluation    Procedure:          Anesthesia, Analgesia, Anxiolysis:  Type: Therapeutic  Inter-Laminar Epidural Steroid Injection  #5 (#1: 11/27/21, #2 01/01/22, #3 02/28/22, #4 05/28/22)  Region: Lumbar Level: L4-5 Level. Laterality: Right-Sided         Anesthesia: Local (1-2% Lidocaine)  Anxiolysis: PO Valium 5 mg Guidance: Fluoroscopy           Position: Prone with head of the table was raised to facilitate breathing.   Indications: 1. Lumbar radiculopathy   2. Neuropathic pain of right foot     Pain Score: Pre-procedure: 6 /10 Post-procedure: 6 /10     Effectiveness:  Initial hour after procedure: 100 %  Subsequent 4-6 hours post-procedure: 100 %  Analgesia past initial 6 hours: 50 % (2-3 weeks)  Ongoing improvement:  Analgesic:  40% Function: Somewhat improved ROM: Somewhat improved  Was able to do more during the holidays   ROS  Constitutional: Denies any fever or chills Gastrointestinal: No reported hemesis, hematochezia, vomiting, or acute GI distress Musculoskeletal:  right lumbar pain , improved after L-ESI Neurological: improved right lower extremity pain  Medication Review  ALPRAZolam, Ashwagandha, B-12, Bepotastine Besilate, DSS, DULoxetine, Fluticasone-Salmeterol, HYDROcodone-Acetaminophen, NON FORMULARY, amLODipine, azelastine, buPROPion, cetirizine, diclofenac sodium, gabapentin, iron polysaccharides, levothyroxine, montelukast, multivitamin with minerals, omeprazole, polyethylene glycol, rosuvastatin, and trolamine salicylate  History Review  Allergy: [name] is allergic to beef-derived products, other, percocet [oxycodone-acetaminophen], and pitocin [oxytocin]. Drug: [name]  reports no history of drug use. Alcohol:  reports current alcohol use. Tobacco:  reports that she has never smoked. She has never used smokeless tobacco. Social: [name]  reports that she has never smoked. She has never used smokeless tobacco. She reports current alcohol use. She reports that she does  not use drugs. Medical:  has a past medical  history of Anemia, Arthritis, Asthma, Barrett's esophagus, Bursitis, Depression, Fibromyalgia, GERD (gastroesophageal reflux disease), History of hiatal hernia, Hypothyroidism, Nontoxic uninodular goiter, and Pernicious anemia. Surgical: [name]  has a past surgical history that includes Cesarean section; Cholecystectomy, laparoscopic; Ankle arthroscopy (Right, 09/26/2018); Tenotomy / flexor tendon transfer (Right, 09/26/2018); Arthrodesis metatarsalphalangeal joint (mtpj) (Right, 09/26/2018); Exostectectomy toe (Right, 09/26/2018); Cholecystectomy; Colonoscopy with propofol (N/A, 06/16/2020); Esophagogastroduodenoscopy (egd) with propofol (N/A, 06/16/2020); and Robotic assisted laparoscopic hysterectomy and salpingectomy (N/A, 07/05/2020). Family: family history includes Cancer in her paternal grandfather; Diabetes in her mother; Heart disease in her mother; Hypertension in her maternal grandfather; Stroke in her brother, father, and mother.  Laboratory Chemistry Profile   Renal Lab Results  Component Value Date   BUN 7 07/01/2020   CREATININE 1.00 07/01/2020   GFRAA >60 07/01/2020   GFRNONAA >60 07/01/2020    Hepatic No results found for: "AST", "ALT", "ALBUMIN", "ALKPHOS", "HCVAB", "AMYLASE", "LIPASE", "AMMONIA"  Electrolytes Lab Results  Component Value Date   NA 139 07/01/2020   K 4.5 07/01/2020   CL 108 07/01/2020   CALCIUM 9.2 07/01/2020    Bone No results found for: "VD25OH", "VD125OH2TOT", "OA4166AY3", "KZ6010XN2", "25OHVITD1", "25OHVITD2", "25OHVITD3", "TESTOFREE", "TESTOSTERONE"  Inflammation (CRP: Acute Phase) (ESR: Chronic Phase) No results found for: "CRP", "ESRSEDRATE", "LATICACIDVEN"       Note: Above Lab results reviewed.  Physical Exam  General appearance: Well nourished, well developed, and well hydrated. In no apparent acute distress Mental status: Alert, oriented x 3 (person, place, & time)       Respiratory: No evidence of acute respiratory distress Eyes:  PERLA Vitals: BP (!) 146/105 Comment: started on BP med last week  Pulse 83   Temp (!) 97.3 F (36.3 C)   Resp 16   Ht '5\' 1"'$  (1.549 m)   Wt 183 lb (83 kg)   LMP 09/10/2019   SpO2 99%   BMI 34.58 kg/m  BMI: Estimated body mass index is 34.58 kg/m as calculated from the following:   Height as of this encounter: '5\' 1"'$  (1.549 m).   Weight as of this encounter: 183 lb (83 kg). Ideal: Ideal body weight: 47.8 kg (105 lb 6.1 oz) Adjusted ideal body weight: 61.9 kg (136 lb 6.8 oz)  Lumbar Spine Area Exam  Skin & Axial Inspection: No masses, redness, or swelling Alignment: Symmetrical Functional ROM: Pain restricted ROM       Stability: No instability detected Muscle Tone/Strength: Functionally intact. No obvious neuro-muscular anomalies detected. Sensory (Neurological): Dermatomal pain pattern right L4-L5, improved after right L-ESI, having increased Lumbar spine pain with facet loading + pain with lumbar extension  consistent with lumbar facet joint syndrom        Lower Extremity Exam      Side: Right lower extremity   Side: Left lower extremity  Stability: No instability observed           Stability: No instability observed          Skin & Extremity Inspection: Evidence of prior arthroplastic surgery redness, decreased hair growth, cool to touch, swelling   Skin & Extremity Inspection: Skin color, temperature, and hair growth are WNL. No peripheral edema or cyanosis. No masses, redness, swelling, asymmetry, or associated skin lesions. No contractures.  Functional ROM: Pain restricted ROM                   Functional ROM: Unrestricted ROM  Muscle Tone/Strength: Functionally intact. No obvious neuro-muscular anomalies detected.   Muscle Tone/Strength: Functionally intact. No obvious neuro-muscular anomalies detected.  Sensory (Neurological): Neurogenic pain pattern improved   Sensory (Neurological): Unimpaired        DTR: Patellar: deferred today Achilles: deferred  today Plantar: deferred today   DTR: Patellar: deferred today Achilles: deferred today Plantar: deferred today  Palpation: No palpable anomalies   Palpation: No palpable anomalies    Assessment   Diagnosis Status  1. Lumbar facet arthropathy   2. Lumbar radiculopathy   3. Neuropathic pain of right foot   4. Complex regional pain syndrome type II of right lower limb   5. Neuropathic pain of both legs   6. Chronic pain syndrome     Responding Controlled Controlled     Plan of Care    Ms. TAUSHA MILHOAN has a current medication list which includes the following long-term medication(s): amlodipine, azelastine, cetirizine, duloxetine, gabapentin, iron polysaccharides, levothyroxine, montelukast, and omeprazole.  1. Lumbar facet arthropathy - LUMBAR FACET(MEDIAL BRANCH NERVE BLOCK) MBNB; Future  2. Lumbar radiculopathy  3. Neuropathic pain of right foot  4. Complex regional pain syndrome type II of right lower limb  5. Neuropathic pain of both legs  6. Chronic pain syndrome - LUMBAR FACET(MEDIAL BRANCH NERVE BLOCK) MBNB; Future  Repeat L-ESI prn   Pharmacotherapy (Medications Ordered): No orders of the defined types were placed in this encounter.  Orders:  Orders Placed This Encounter  Procedures   LUMBAR FACET(MEDIAL BRANCH NERVE BLOCK) MBNB    Standing Status:   Future    Standing Expiration Date:   04/03/2023    Scheduling Instructions:     Procedure: Lumbar facet block (AKA.: Lumbosacral medial branch nerve block)     Side: Bilateral     Level: L3-4 & L5-S1 Facets (L3, L4, L5, Medial Branch Nerves)     Sedation:  Po Valium.     Timeframe: ASAA    Order Specific Question:   Where will this procedure be performed?    Answer:   [facility]   Follow-up plan:   Return in about 2 weeks (around 01/16/2023) for B/L L3, 4, 5 MBNB, in clinic (PO Valium).     Right lower extremity CRPS, lumbar sympathetic nerve block denied by insurance.  Right L4-5  ESI #2 01/01/22, #3 02/28/22, #4 11/16/22, Consider spinal cord stimulator trial in future.       Recent Visits Date Type Provider Dept  11/21/22 Procedure visit Gillis Santa, MD [facility]  11/01/22 Office Visit Gillis Santa, MD [facility]  Showing recent visits within past 90 days and meeting all other requirements Today's Visits Date Type Provider Dept  01/02/23 Office Visit Gillis Santa, MD [facility]  Showing today's visits and meeting all other requirements Future Appointments No visits were found meeting these conditions. Showing future appointments within next 90 days and meeting all other requirements  I discussed the assessment and treatment plan with the patient. The patient was provided an opportunity to ask questions and all were answered. The patient agreed with the plan and demonstrated an understanding of the instructions.  Patient advised to call back or seek an in-person evaluation if the symptoms or condition worsens.  Duration of encounter: 30 minutes.  Note by: Gillis Santa, MD Date: 01/02/2023; Time: 2:39 PM

## 2023-01-16 ENCOUNTER — Encounter: Payer: Self-pay | Admitting: Student in an Organized Health Care Education/Training Program

## 2023-01-16 ENCOUNTER — Ambulatory Visit
Payer: BC Managed Care – PPO | Attending: Student in an Organized Health Care Education/Training Program | Admitting: Student in an Organized Health Care Education/Training Program

## 2023-01-16 ENCOUNTER — Ambulatory Visit
Admission: RE | Admit: 2023-01-16 | Discharge: 2023-01-16 | Disposition: A | Discharge status: Home / Self Care | Payer: BC Managed Care – PPO | Source: Ambulatory Visit | Attending: Student in an Organized Health Care Education/Training Program | Admitting: Student in an Organized Health Care Education/Training Program

## 2023-01-16 ENCOUNTER — Other Ambulatory Visit: Payer: Self-pay | Admitting: Gastroenterology

## 2023-01-16 DIAGNOSIS — M47816 Spondylosis without myelopathy or radiculopathy, lumbar region: Secondary | ICD-10-CM | POA: Diagnosis not present

## 2023-01-16 DIAGNOSIS — R1084 Generalized abdominal pain: Secondary | ICD-10-CM

## 2023-01-16 DIAGNOSIS — G894 Chronic pain syndrome: Secondary | ICD-10-CM | POA: Insufficient documentation

## 2023-01-16 MED ORDER — DIAZEPAM 5 MG PO TABS
ORAL_TABLET | ORAL | Status: AC
  Filled 2023-01-16: qty 1

## 2023-01-16 MED ORDER — LIDOCAINE HCL 2 % IJ SOLN
20.0000 mL | Freq: Once | INTRAMUSCULAR | Status: AC
  Administered 2023-01-16: 400 mg

## 2023-01-16 MED ORDER — DEXAMETHASONE SODIUM PHOSPHATE 10 MG/ML IJ SOLN
10.0000 mg | Freq: Once | INTRAMUSCULAR | Status: AC
  Administered 2023-01-16: 10 mg

## 2023-01-16 MED ORDER — DEXAMETHASONE SODIUM PHOSPHATE 10 MG/ML IJ SOLN
INTRAMUSCULAR | Status: AC
  Filled 2023-01-16: qty 1

## 2023-01-16 MED ORDER — LIDOCAINE HCL 2 % IJ SOLN
INTRAMUSCULAR | Status: AC
  Filled 2023-01-16: qty 20

## 2023-01-16 MED ORDER — ROPIVACAINE HCL 2 MG/ML IJ SOLN
INTRAMUSCULAR | Status: AC
  Filled 2023-01-16: qty 20

## 2023-01-16 MED ORDER — ROPIVACAINE HCL 2 MG/ML IJ SOLN
9.0000 mL | Freq: Once | INTRAMUSCULAR | Status: AC
  Administered 2023-01-16: 9 mL via PERINEURAL

## 2023-01-16 MED ORDER — DIAZEPAM 5 MG PO TABS
5.0000 mg | ORAL_TABLET | ORAL | Status: AC
  Administered 2023-01-16: 5 mg via ORAL

## 2023-01-16 NOTE — Progress Notes (Signed)
Safety precautions to be maintained throughout the outpatient stay will include: orient to surroundings, keep bed in low position, maintain call bell within reach at all times, provide assistance with transfer out of bed and ambulation.  

## 2023-01-16 NOTE — Patient Instructions (Signed)

## 2023-01-16 NOTE — Progress Notes (Signed)
PROVIDER NOTE: Interpretation of information contained herein should be left to medically-trained personnel. Specific patient instructions are provided elsewhere under "Patient Instructions" section of medical record. This document was created in part using STT-dictation technology, any transcriptional errors that may result from this process are unintentional.  Patient: Valerie Welch Type: Established DOB: Aug 10, 1971 MRN: [id number] PCP: Tracie Harrier, MD  Service: Procedure DOS: 01/16/2023 Setting: Ambulatory Location: Ambulatory outpatient facility Delivery: Face-to-face Provider: Gillis Santa, MD Specialty: Interventional Pain Management Specialty designation: 09 Location: Outpatient facility Ref. Prov.: Gillis Santa, MD    Procedure:           Type: Lumbar Facet, Medial Branch Block(s) #1  Laterality: Bilateral  Level: L3, L4, and L5 Medial Branch Level(s). Injecting these levels blocks the L3-4 and L4-5 lumbar facet joints. Imaging: Fluoroscopic guidance         Anesthesia: Local anesthesia (1-2% Lidocaine) Anxiolysis: Oral Valium         DOS: 01/16/2023 Performed by: Gillis Santa, MD  Primary Purpose: Diagnostic/Therapeutic Indications: Low back pain severe enough to impact quality of life or function. 1. Chronic pain syndrome   2. Lumbar facet arthropathy    NAS-11 Pain score:   Pre-procedure: 6 /10   Post-procedure: 6 /10     Position / Prep / Materials:  Position: Prone  Prep solution: DuraPrep (Iodine Povacrylex [0.7% available iodine] and Isopropyl Alcohol, 74% w/w) Area Prepped: Posterolateral Lumbosacral Spine (Wide prep: From the lower border of the scapula down to the end of the tailbone and from flank to flank.)  Materials:  Tray: Block Needle(s):  Type: Spinal  Gauge (G): 22  Length: 3.5-in Qty: 2     Pre-op H&P Assessment:  Valerie Welch is a 52 y.o. (year old), female patient, seen today for interventional treatment. She  has a past  surgical history that includes Cesarean section; Cholecystectomy, laparoscopic; Ankle arthroscopy (Right, 09/26/2018); Tenotomy / flexor tendon transfer (Right, 09/26/2018); Arthrodesis metatarsalphalangeal joint (mtpj) (Right, 09/26/2018); Exostectectomy toe (Right, 09/26/2018); Cholecystectomy; Colonoscopy with propofol (N/A, 06/16/2020); Esophagogastroduodenoscopy (egd) with propofol (N/A, 06/16/2020); and Robotic assisted laparoscopic hysterectomy and salpingectomy (N/A, 07/05/2020). Ms. Towell has a current medication list which includes the following prescription(s): alprazolam, amlodipine, ashwagandha, azelastine, bepreve, bupropion, cetirizine, b-12, diclofenac sodium, duloxetine, fluticasone-salmeterol, gabapentin, hydrocodone-acetaminophen, iron polysaccharides, levothyroxine, montelukast, multivitamin with minerals, NON FORMULARY, omeprazole, polyethylene glycol, rosuvastatin, trolamine salicylate, and dss. Her primarily concern today is the Back Pain (lower)  Initial Vital Signs:  Pulse/HCG Rate: 95  Temp: 97.7 F (36.5 C) Resp: 17 BP: (!) 133/101 SpO2: 99 %  BMI: Estimated body mass index is 34.39 kg/m as calculated from the following:   Height as of this encounter: '5\' 1"'$  (1.549 m).   Weight as of this encounter: 182 lb (82.6 kg).  Risk Assessment: Allergies: Reviewed. She is allergic to beef-derived products, other, percocet [oxycodone-acetaminophen], and pitocin [oxytocin].  Allergy Precautions: None required Coagulopathies: Reviewed. None identified.  Blood-thinner therapy: None at this time Active Infection(s): Reviewed. None identified. Ms. Hakala is afebrile  Site Confirmation: Ms. Minetti was asked to confirm the procedure and laterality before marking the site Procedure checklist: Completed Consent: Before the procedure and under the influence of no sedative(s), amnesic(s), or anxiolytics, the patient was informed of the treatment options, risks and possible  complications. To fulfill our ethical and legal obligations, as recommended by the American Medical Association's Code of Ethics, I have informed the patient of my clinical impression; the nature and purpose of the treatment or procedure; the risks, benefits,  and possible complications of the intervention; the alternatives, including doing nothing; the risk(s) and benefit(s) of the alternative treatment(s) or procedure(s); and the risk(s) and benefit(s) of doing nothing. The patient was provided information about the general risks and possible complications associated with the procedure. These may include, but are not limited to: failure to achieve desired goals, infection, bleeding, organ or nerve damage, allergic reactions, paralysis, and death. In addition, the patient was informed of those risks and complications associated to Spine-related procedures, such as failure to decrease pain; infection (i.e.: Meningitis, epidural or intraspinal abscess); bleeding (i.e.: epidural hematoma, subarachnoid hemorrhage, or any other type of intraspinal or peri-dural bleeding); organ or nerve damage (i.e.: Any type of peripheral nerve, nerve root, or spinal cord injury) with subsequent damage to sensory, motor, and/or autonomic systems, resulting in permanent pain, numbness, and/or weakness of one or several areas of the body; allergic reactions; (i.e.: anaphylactic reaction); and/or death. Furthermore, the patient was informed of those risks and complications associated with the medications. These include, but are not limited to: allergic reactions (i.e.: anaphylactic or anaphylactoid reaction(s)); adrenal axis suppression; blood sugar elevation that in diabetics may result in ketoacidosis or comma; water retention that in patients with history of congestive heart failure may result in shortness of breath, pulmonary edema, and decompensation with resultant heart failure; weight gain; swelling or edema; medication-induced  neural toxicity; particulate matter embolism and blood vessel occlusion with resultant organ, and/or nervous system infarction; and/or aseptic necrosis of one or more joints. Finally, the patient was informed that Medicine is not an exact science; therefore, there is also the possibility of unforeseen or unpredictable risks and/or possible complications that may result in a catastrophic outcome. The patient indicated having understood very clearly. We have given the patient no guarantees and we have made no promises. Enough time was given to the patient to ask questions, all of which were answered to the patient's satisfaction. Ms. Caison has indicated that she wanted to continue with the procedure. Attestation: I, the ordering provider, attest that I have discussed with the patient the benefits, risks, side-effects, alternatives, likelihood of achieving goals, and potential problems during recovery for the procedure that I have provided informed consent. Date  Time: 01/16/2023  8:47 AM  Pre-Procedure Preparation:  Monitoring: As per clinic protocol. Respiration, ETCO2, SpO2, BP, heart rate and rhythm monitor placed and checked for adequate function Safety Precautions: Patient was assessed for positional comfort and pressure points before starting the procedure. Time-out: I initiated and conducted the "Time-out" before starting the procedure, as per protocol. The patient was asked to participate by confirming the accuracy of the "Time Out" information. Verification of the correct person, site, and procedure were performed and confirmed by me, the nursing staff, and the patient. "Time-out" conducted as per Joint Commission's Universal Protocol (UP.01.01.01). Time: 0918  Description of Procedure:          Laterality: Bilateral. The procedure was performed in identical fashion on both sides. Targeted Levels: L3, L4, and L5  Medial Branch Level(s)  Safety Precautions: Aspiration looking for blood  return was conducted prior to all injections. At no point did we inject any substances, as a needle was being advanced. Before injecting, the patient was told to immediately notify me if she was experiencing any new onset of "ringing in the ears, or metallic taste in the mouth". No attempts were made at seeking any paresthesias. Safe injection practices and needle disposal techniques used. Medications properly checked for expiration dates. SDV (single  dose vial) medications used. After the completion of the procedure, all disposable equipment used was discarded in the proper designated medical waste containers. Local Anesthesia: Protocol guidelines were followed. The patient was positioned over the fluoroscopy table. The area was prepped in the usual manner. The time-out was completed. The target area was identified using fluoroscopy. A 12-in long, straight, sterile hemostat was used with fluoroscopic guidance to locate the targets for each level blocked. Once located, the skin was marked with an approved surgical skin marker. Once all sites were marked, the skin (epidermis, dermis, and hypodermis), as well as deeper tissues (fat, connective tissue and muscle) were infiltrated with a small amount of a short-acting local anesthetic, loaded on a 10cc syringe with a 25G, 1.5-in  Needle. An appropriate amount of time was allowed for local anesthetics to take effect before proceeding to the next step. Local Anesthetic: Lidocaine 2.0% The unused portion of the local anesthetic was discarded in the proper designated containers. Technical description of process:  L3 Medial Branch Nerve Block (MBB): The target area for the L3 medial branch is at the junction of the postero-lateral aspect of the superior articular process and the superior, posterior, and medial edge of the transverse process of L4. Under fluoroscopic guidance, a Quincke needle was inserted until contact was made with os over the superior postero-lateral  aspect of the pedicular shadow (target area). After negative aspiration for blood, 1.18m of the nerve block solution was injected without difficulty or complication. The needle was removed intact. L4 Medial Branch Nerve Block (MBB): The target area for the L4 medial branch is at the junction of the postero-lateral aspect of the superior articular process and the superior, posterior, and medial edge of the transverse process of L5. Under fluoroscopic guidance, a Quincke needle was inserted until contact was made with os over the superior postero-lateral aspect of the pedicular shadow (target area). After negative aspiration for blood, 1.5 mL of the nerve block solution was injected without difficulty or complication. The needle was removed intact. L5 Medial Branch Nerve Block (MBB): The target area for the L5 medial branch is at the junction of the postero-lateral aspect of the superior articular process and the superior, posterior, and medial edge of the sacral ala. Under fluoroscopic guidance, a Quincke needle was inserted until contact was made with os over the superior postero-lateral aspect of the pedicular shadow (target area). After negative aspiration for blood,1.571mof the nerve block solution was injected without difficulty or complication. The needle was removed intact.   10 cc solution made of 8cc of 0.2% ropivacaine, 2 cc of Decadron 10 mg/cc.  1.5 cc injected at each level above bilaterally.   Once the entire procedure was completed, the treated area was cleaned, making sure to leave some of the prepping solution back to take advantage of its long term bactericidal properties.         Illustration of the posterior view of the lumbar spine and the posterior neural structures. Laminae of L2 through S1 are labeled. DPRL5, dorsal primary ramus of L5; DPRS1, dorsal primary ramus of S1; DPR3, dorsal primary ramus of L3; FJ, facet (zygapophyseal) joint L3-L4; I, inferior articular process of L4;  LB1, lateral branch of dorsal primary ramus of L1; IAB, inferior articular branches from L3 medial branch (supplies L4-L5 facet joint); IBP, intermediate branch plexus; MB3, medial branch of dorsal primary ramus of L3; NR3, third lumbar nerve root; S, superior articular process of L5; SAB, superior articular branches from L4 (  supplies L4-5 facet joint also); TP3, transverse process of L3.  Vitals:   01/16/23 0912 01/16/23 0917 01/16/23 0922 01/16/23 0927  BP: (!) 144/108 (!) 131/107 (!) 142/111 (!) 142/112  Pulse: 92 98 100   Resp: '15 15 14   '$ Temp:      TempSrc:      SpO2: 99% 98% 100%   Weight:      Height:         Start Time: 0918 hrs. End Time: 0926 hrs.  Imaging Guidance (Spinal):          Type of Imaging Technique: Fluoroscopy Guidance (Spinal) Indication(s): Assistance in needle guidance and placement for procedures requiring needle placement in or near specific anatomical locations not easily accessible without such assistance. Exposure Time: Please see nurses notes. Contrast: None used. Fluoroscopic Guidance: I was personally present during the use of fluoroscopy. "Tunnel Vision Technique" used to obtain the best possible view of the target area. Parallax error corrected before commencing the procedure. "Direction-depth-direction" technique used to introduce the needle under continuous pulsed fluoroscopy. Once target was reached, antero-posterior, oblique, and lateral fluoroscopic projection used confirm needle placement in all planes. Images permanently stored in EMR. Interpretation: No contrast injected. I personally interpreted the imaging intraoperatively. Adequate needle placement confirmed in multiple planes. Permanent images saved into the patient's record.  Post-operative Assessment:  Post-procedure Vital Signs:  Pulse/HCG Rate: 100  Temp: 97.7 F (36.5 C) Resp: 14 BP: (!) 142/112 SpO2: 100 %  EBL: None  Complications: No immediate post-treatment complications  observed by team, or reported by patient.  Note: The patient tolerated the entire procedure well. A repeat set of vitals were taken after the procedure and the patient was kept under observation following institutional policy, for this type of procedure. Post-procedural neurological assessment was performed, showing return to baseline, prior to discharge. The patient was provided with post-procedure discharge instructions, including a section on how to identify potential problems. Should any problems arise concerning this procedure, the patient was given instructions to immediately contact us, at any time, without hesitation. In any case, we plan to contact the patient by telephone for a follow-up status report regarding this interventional procedure.  Comments:  No additional relevant information.  Plan of Care  Orders:  Orders Placed This Encounter  Procedures   DG [facility] C-ARM 1-60 MIN NO REPORT    Intraoperative interpretation by procedural physician at [place].    Standing Status:   Standing    Number of Occurrences:   1    Order Specific Question:   Reason for exam:    Answer:   Assistance in needle guidance and placement for procedures requiring needle placement in or near specific anatomical locations not easily accessible without such assistance.    Medications ordered for procedure: Meds ordered this encounter  Medications   lidocaine (XYLOCAINE) 2 % (with pres) injection 400 mg   diazepam (VALIUM) tablet 5 mg    Make sure Flumazenil is available in the pyxis when using this medication. If oversedation occurs, administer 0.2 mg IV over 15 sec. If after 45 sec no response, administer 0.2 mg again over 1 min; may repeat at 1 min intervals; not to exceed 4 doses (1 mg)   dexamethasone (DECADRON) injection 10 mg   dexamethasone (DECADRON) injection 10 mg   ropivacaine (PF) 2 mg/mL (0.2%) (NAROPIN) injection 9 mL   Medications administered: We administered  lidocaine, diazepam, dexamethasone, dexamethasone, and ropivacaine (PF) 2 mg/mL (0.2%).  See the  medical record for exact dosing, route, and time of administration.  Follow-up plan:   Return in about 4 weeks (around 02/13/2023) for Post Procedure Evaluation, in person.       Right lower extremity CRPS, lumbar sympathetic nerve block denied by insurance.  Right L4-5 ESI #2 01/01/22, #3 02/28/22, #4 11/16/22, Consider spinal cord stimulator trial in future.  Bilateral L3, L4, L5 medial branch nerve block 01/16/2023      Recent Visits Date Type Provider Dept  01/02/23 Office Visit Gillis Santa, MD [facility]  11/21/22 Procedure visit Gillis Santa, MD [facility]  11/01/22 Office Visit Gillis Santa, MD [facility]  Showing recent visits within past 90 days and meeting all other requirements Today's Visits Date Type Provider Dept  01/16/23 Procedure visit Gillis Santa, MD [facility]  Showing today's visits and meeting all other requirements Future Appointments Date Type Provider Dept  02/13/23 Appointment Gillis Santa, MD [facility]  Showing future appointments within next 90 days and meeting all other requirements  Disposition: Discharge home  Discharge (Date  Time): 01/16/2023; 0937 hrs.   Primary Care Physician: Tracie Harrier, MD Location: [facility] Note by: Gillis Santa, MD Date: 01/16/2023; Time: 9:46 AM  Disclaimer:  Medicine is not an exact science. The only guarantee in medicine is that nothing is guaranteed. It is important to note that the decision to proceed with this intervention was based on the information collected from the patient. The Data and conclusions were drawn from the patient's questionnaire, the interview, and the physical examination. Because the information was provided in large part by the patient, it cannot be guaranteed that it has not been purposely or unconsciously  manipulated. Every effort has been made to obtain as much relevant data as possible for this evaluation. It is important to note that the conclusions that lead to this procedure are derived in large part from the available data. Always take into account that the treatment will also be dependent on availability of resources and existing treatment guidelines, considered by other Pain Management Practitioners as being common knowledge and practice, at the time of the intervention. For Medico-Legal purposes, it is also important to point out that variation in procedural techniques and pharmacological choices are the acceptable norm. The indications, contraindications, technique, and results of the above procedure should only be interpreted and judged by a Board-Certified Interventional Pain Specialist with extensive familiarity and expertise in the same exact procedure and technique.

## 2023-01-17 ENCOUNTER — Telehealth: Payer: Self-pay

## 2023-01-17 NOTE — Telephone Encounter (Signed)
Post procedure follow up.  Patient states she is doing good.  

## 2023-01-29 ENCOUNTER — Other Ambulatory Visit: Payer: Self-pay | Admitting: Obstetrics & Gynecology

## 2023-01-29 DIAGNOSIS — Z1231 Encounter for screening mammogram for malignant neoplasm of breast: Secondary | ICD-10-CM

## 2023-02-12 ENCOUNTER — Ambulatory Visit
Admission: RE | Admit: 2023-02-12 | Discharge: 2023-02-12 | Disposition: A | Discharge status: Home / Self Care | Payer: BC Managed Care – PPO | Source: Ambulatory Visit | Attending: Gastroenterology | Admitting: Gastroenterology

## 2023-02-12 DIAGNOSIS — R1084 Generalized abdominal pain: Secondary | ICD-10-CM | POA: Diagnosis present

## 2023-02-12 MED ORDER — IOHEXOL 300 MG/ML  SOLN
100.0000 mL | Freq: Once | INTRAMUSCULAR | Status: AC | PRN
  Administered 2023-02-12: 100 mL via INTRAVENOUS

## 2023-02-13 ENCOUNTER — Encounter: Payer: Self-pay | Admitting: Student in an Organized Health Care Education/Training Program

## 2023-02-13 ENCOUNTER — Ambulatory Visit
Payer: BC Managed Care – PPO | Attending: Student in an Organized Health Care Education/Training Program | Admitting: Student in an Organized Health Care Education/Training Program

## 2023-02-13 VITALS — BP 123/93 | HR 81 | Temp 97.3°F | Resp 16 | Ht 61.0 in | Wt 182.0 lb

## 2023-02-13 DIAGNOSIS — G894 Chronic pain syndrome: Secondary | ICD-10-CM

## 2023-02-13 DIAGNOSIS — M47816 Spondylosis without myelopathy or radiculopathy, lumbar region: Secondary | ICD-10-CM | POA: Insufficient documentation

## 2023-02-13 DIAGNOSIS — G5771 Causalgia of right lower limb: Secondary | ICD-10-CM | POA: Insufficient documentation

## 2023-02-13 NOTE — Progress Notes (Signed)
PROVIDER NOTE: Information contained herein reflects review and annotations entered in association with encounter. Interpretation of such information and data should be left to medically-trained personnel. Information provided to patient can be located elsewhere in the medical record under "Patient Instructions". Document created using STT-dictation technology, any transcriptional errors that may result from process are unintentional.    Patient: Valerie Welch  Service Category: E/M  Provider: Gillis Santa, MD  DOB: April 19, 1971  DOS: 02/13/2023  Referring Provider: Tracie Harrier, MD  MRN: [id number]  Specialty: Interventional Pain Management  PCP: Tracie Harrier, MD  Type: Established Patient  Setting: Ambulatory outpatient    Location: Office  Delivery: Face-to-face     HPI  [name], a 52 y.o. year old female, is here today because of her Lumbar facet arthropathy [M47.816]. [name] primary complain today is Back Pain Last encounter: My last encounter with her was on 01/16/2023. Pertinent problems: [name] has History of foot surgery; Generalized anxiety disorder; Neuropathic pain of both legs; Complex regional pain syndrome type II of right lower limb; and Neuropathic pain of right foot on their pertinent problem list. Pain Assessment: Severity of Chronic pain is reported as a 2 /10. Location: Back Lower/hips bilateral down back of right leg( Patient states she is better). Onset: More than a month ago. Quality: Constant, Discomfort. Timing: Constant. Modifying factor(s): meds, topical. Vitals:  height is 5' 1"$  (1.549 m) and weight is 182 lb (82.6 kg). Her temperature is 97.3 F (36.3 C) (abnormal). Her blood pressure is 123/93 (abnormal) and her pulse is 81. Her respiration is 16 and oxygen saturation is 100%.  BMI: Estimated body mass index is 34.39 kg/m as calculated from the following:   Height as of this encounter: 5' 1"$  (1.549 m).   Weight as of  this encounter: 182 lb (82.6 kg).  Reason for encounter: post-procedure evaluation and assessment.    Post-procedure evaluation   Type: Lumbar Facet, Medial Branch Block(s) #1  Laterality: Bilateral  Level: L3, L4, and L5 Medial Branch Level(s). Injecting these levels blocks the L3-4 and L4-5 lumbar facet joints. Imaging: Fluoroscopic guidance         Anesthesia: Local anesthesia (1-2% Lidocaine) Anxiolysis: Oral Valium         DOS: 01/16/2023 Performed by: Gillis Santa, MD  Primary Purpose: Diagnostic/Therapeutic Indications: Low back pain severe enough to impact quality of life or function. 1. Chronic pain syndrome   2. Lumbar facet arthropathy    NAS-11 Pain score:   Pre-procedure: 6 /10   Post-procedure: 6 /10      Effectiveness:  Initial hour after procedure: 100 %  Subsequent 4-6 hours post-procedure: 100 %  Analgesia past initial 6 hours: 80 % (current)  Ongoing improvement:  Analgesic:  80% Function: [name] reports improvement in function ROM: [name] reports improvement in ROM   ROS  Constitutional: Denies any fever or chills Gastrointestinal: No reported hemesis, hematochezia, vomiting, or acute GI distress Musculoskeletal:  +LBP, mild, significantly improved from before Neurological: No reported episodes of acute onset apraxia, aphasia, dysarthria, agnosia, amnesia, paralysis, loss of coordination, or loss of consciousness  Medication Review  ALPRAZolam, Ashwagandha, B-12, Bepotastine Besilate, DSS, DULoxetine, Fluticasone-Salmeterol, HYDROcodone-Acetaminophen, NON FORMULARY, amLODipine, azelastine, buPROPion, cetirizine, diclofenac sodium, gabapentin, iron polysaccharides, levothyroxine, montelukast, multivitamin with minerals, omeprazole, polyethylene glycol, rosuvastatin, and trolamine salicylate  History Review  Allergy: [name] is allergic to beef-derived products, other, percocet [oxycodone-acetaminophen], and pitocin  [oxytocin]. Drug: [name]  reports no history of drug  use. Alcohol:  reports current alcohol use. Tobacco:  reports that she has never smoked. She has never used smokeless tobacco. Social: [name]  reports that she has never smoked. She has never used smokeless tobacco. She reports current alcohol use. She reports that she does not use drugs. Medical:  has a past medical history of Anemia, Arthritis, Asthma, Barrett's esophagus, Bursitis, Depression, Fibromyalgia, GERD (gastroesophageal reflux disease), History of hiatal hernia, Hypothyroidism, Nontoxic uninodular goiter, and Pernicious anemia. Surgical: [name]  has a past surgical history that includes Cesarean section; Cholecystectomy, laparoscopic; Ankle arthroscopy (Right, 09/26/2018); Tenotomy / flexor tendon transfer (Right, 09/26/2018); Arthrodesis metatarsalphalangeal joint (mtpj) (Right, 09/26/2018); Exostectectomy toe (Right, 09/26/2018); Cholecystectomy; Colonoscopy with propofol (N/A, 06/16/2020); Esophagogastroduodenoscopy (egd) with propofol (N/A, 06/16/2020); and Robotic assisted laparoscopic hysterectomy and salpingectomy (N/A, 07/05/2020). Family: family history includes Cancer in her paternal grandfather; Diabetes in her mother; Heart disease in her mother; Hypertension in her maternal grandfather; Stroke in her brother, father, and mother.  Laboratory Chemistry Profile   Renal Lab Results  Component Value Date   BUN 7 07/01/2020   CREATININE 1.00 07/01/2020   GFRAA >60 07/01/2020   GFRNONAA >60 07/01/2020    Hepatic No results found for: "AST", "ALT", "ALBUMIN", "ALKPHOS", "HCVAB", "AMYLASE", "LIPASE", "AMMONIA"  Electrolytes Lab Results  Component Value Date   NA 139 07/01/2020   K 4.5 07/01/2020   CL 108 07/01/2020   CALCIUM 9.2 07/01/2020    Bone No results found for: "VD25OH", "VD125OH2TOT", "[id number]", "UK:060616", "25OHVITD1", "25OHVITD2", "25OHVITD3", "TESTOFREE", "TESTOSTERONE"   Inflammation (CRP: Acute Phase) (ESR: Chronic Phase) No results found for: "CRP", "ESRSEDRATE", "LATICACIDVEN"       Note: Above Lab results reviewed.  Recent Imaging Review  CT ABDOMEN PELVIS W CONTRAST CLINICAL DATA:  Right upper quadrant abdominal pain for several months. Chronic constipation. Irritable bowel syndrome.  EXAM: CT ABDOMEN AND PELVIS WITH CONTRAST  TECHNIQUE: Multidetector CT imaging of the abdomen and pelvis was performed using the standard protocol following bolus administration of intravenous contrast.  RADIATION DOSE REDUCTION: This exam was performed according to the departmental dose-optimization program which includes automated exposure control, adjustment of the mA and/or kV according to patient size and/or use of iterative reconstruction technique.  CONTRAST:  173m OMNIPAQUE IOHEXOL 300 MG/ML  SOLN  COMPARISON:  12/22/2010 stone study  FINDINGS: Lower chest: Left lower lobe calcified granuloma. Normal heart size without pericardial or pleural effusion. Moderate hiatal hernia, with approximately 1/2 of the stomach positioned in the chest.  Hepatobiliary: Normal liver. Cholecystectomy, without biliary ductal dilatation.  Pancreas: Fatty replaced pancreatic head and uncinate process. No duct dilatation or acute inflammation.  Spleen: Normal in size, without focal abnormality.  Adrenals/Urinary Tract: Normal adrenal glands. Normal kidneys, without hydronephrosis. Normal urinary bladder.  Stomach/Bowel: Moderate hiatal hernia, as detailed above.  Formed stool throughout the descending colon.  Normal terminal ileum and appendix.  Normal small bowel.  Vascular/Lymphatic: Aortic atherosclerosis. No abdominopelvic adenopathy.  Reproductive: Hysterectomy.  No adnexal mass.  Other: No significant free fluid. No free intraperitoneal air. Tiny bilateral fat containing inguinal hernias.  Musculoskeletal: Advanced degenerative disc disease at  the lumbosacral junction.  IMPRESSION: 1.  No acute process in the abdomen or pelvis. 2. Formed stool throughout the left-sided colon, possibly representing a component of constipation. 3. Moderate hiatal hernia 4.  Aortic Atherosclerosis (ICD10-I70.0).  Electronically Signed   By: KAbigail MiyamotoM.D.   On: 02/12/2023 18:24 Note: Reviewed        Physical Exam  General appearance:  Well nourished, well developed, and well hydrated. In no apparent acute distress Mental status: Alert, oriented x 3 (person, place, & time)       Respiratory: No evidence of acute respiratory distress Eyes: PERLA Vitals: BP (!) 123/93 (BP Location: Left Arm, Patient Position: Sitting, Cuff Size: Normal) Comment: took BP Meds, medsfor cough  Pulse 81   Temp (!) 97.3 F (36.3 C)   Resp 16   Ht 5' 1"$  (1.549 m)   Wt 182 lb (82.6 kg)   LMP 09/10/2019   SpO2 100%   BMI 34.39 kg/m  BMI: Estimated body mass index is 34.39 kg/m as calculated from the following:   Height as of this encounter: 5' 1"$  (1.549 m).   Weight as of this encounter: 182 lb (82.6 kg). Ideal: Ideal body weight: 47.8 kg (105 lb 6.1 oz) Adjusted ideal body weight: 61.7 kg (136 lb 0.5 oz)  Lumbar Spine Area Exam  Skin & Axial Inspection: No masses, redness, or swelling Alignment: Symmetrical Functional ROM: Pain restricted ROM       however improved from before Stability: No instability detected Muscle Tone/Strength: Functionally intact. No obvious neuro-muscular anomalies detected. Sensory (Neurological): Improved Palpation: No palpable anomalies       Provocative Tests: Hyperextension/rotation test: (+) bilaterally for facet joint pain. Lumbar quadrant test (Kemp's test): (+) bilaterally for facet joint pain.  Gait & Posture Assessment  Ambulation: Unassisted Gait: Relatively normal for age and body habitus Posture: WNL  Lower Extremity Exam    Side: Right lower extremity  Side: Left lower extremity  Stability: No instability  observed          Stability: No instability observed          Skin & Extremity Inspection: Skin color, temperature, and hair growth are WNL. No peripheral edema or cyanosis. No masses, redness, swelling, asymmetry, or associated skin lesions. No contractures.  Skin & Extremity Inspection: Skin color, temperature, and hair growth are WNL. No peripheral edema or cyanosis. No masses, redness, swelling, asymmetry, or associated skin lesions. No contractures.  Functional ROM: Unrestricted ROM                  Functional ROM: Unrestricted ROM                  Muscle Tone/Strength: Functionally intact. No obvious neuro-muscular anomalies detected.  Muscle Tone/Strength: Functionally intact. No obvious neuro-muscular anomalies detected.  Sensory (Neurological): Unimpaired        Sensory (Neurological): Unimpaired        DTR: Patellar: deferred today Achilles: deferred today Plantar: deferred today  DTR: Patellar: deferred today Achilles: deferred today Plantar: deferred today  Palpation: No palpable anomalies  Palpation: No palpable anomalies    Assessment   Diagnosis Status  1. Lumbar facet arthropathy   2. Complex regional pain syndrome type II of right lower limb   3. Chronic pain syndrome    Improved Controlled Controlled   Updated Problems: No problems updated.  Plan of Care  Significant improvement in axial low back pain and functional status after lumbar facet medial branch nerve blocks.  I encouraged her to continue to monitor her symptoms and if her pain returns, we can consider repeating lumbar facet medial branch nerve block #2 and then further discussing lumbar radiofrequency ablation for the purpose of obtaining longer-term pain relief.   Orders:  Orders Placed This Encounter  Procedures   LUMBAR FACET(MEDIAL BRANCH NERVE BLOCK) MBNB    Standing Status:  Standing    Number of Occurrences:   2    Standing Expiration Date:   08/14/2023    Scheduling Instructions:      Procedure: Lumbar facet block (AKA.: Lumbosacral medial branch nerve block)     Side: Bilateral     Level: L3-4, L4-5, Facets ( L3, L4, L5,  Medial Branch)     Sedation: PO Valium or IV Versed, up to patient     Timeframe: ASAA    Order Specific Question:   Where will this procedure be performed?    Answer:   [facility]   Follow-up plan:   Return for call to repeat lumbar MBNB #2 when needed.      Right lower extremity CRPS, lumbar sympathetic nerve block denied by insurance.  Right L4-5 ESI #2 01/01/22, #3 02/28/22, #4 11/16/22, Consider spinal cord stimulator trial in future.  Bilateral L3, L4, L5 medial branch nerve block 01/16/2023        Recent Visits Date Type Provider Dept  01/16/23 Procedure visit Gillis Santa, MD [facility]  01/02/23 Office Visit Gillis Santa, MD [facility]  11/21/22 Procedure visit Gillis Santa, MD [facility]  Showing recent visits within past 90 days and meeting all other requirements Today's Visits Date Type Provider Dept  02/13/23 Office Visit Gillis Santa, MD [facility]  Showing today's visits and meeting all other requirements Future Appointments No visits were found meeting these conditions. Showing future appointments within next 90 days and meeting all other requirements  I discussed the assessment and treatment plan with the patient. The patient was provided an opportunity to ask questions and all were answered. The patient agreed with the plan and demonstrated an understanding of the instructions.  Patient advised to call back or seek an in-person evaluation if the symptoms or condition worsens.  Duration of encounter: 44mnutes.  Total time on encounter, as per AMA guidelines included both the face-to-face and non-face-to-face time personally spent by the physician and/or other qualified health care professional(s) on the day of the encounter (includes time in activities that require the  physician or other qualified health care professional and does not include time in activities normally performed by clinical staff). Physician's time may include the following activities when performed: Preparing to see the patient (e.g., pre-charting review of records, searching for previously ordered imaging, lab work, and nerve conduction tests) Review of prior analgesic pharmacotherapies. Reviewing PMP Interpreting ordered tests (e.g., lab work, imaging, nerve conduction tests) Performing post-procedure evaluations, including interpretation of diagnostic procedures Obtaining and/or reviewing separately obtained history Performing a medically appropriate examination and/or evaluation Counseling and educating the patient/family/caregiver Ordering medications, tests, or procedures Referring and communicating with other health care professionals (when not separately reported) Documenting clinical information in the electronic or other health record Independently interpreting results (not separately reported) and communicating results to the patient/ family/caregiver Care coordination (not separately reported)  Note by: BGillis Santa MD Date: 02/13/2023; Time: 2:48 PM

## 2023-02-13 NOTE — Progress Notes (Signed)
Safety precautions to be maintained throughout the outpatient stay will include: orient to surroundings, keep bed in low position, maintain call bell within reach at all times, provide assistance with transfer out of bed and ambulation.  

## 2023-02-13 NOTE — Patient Instructions (Signed)
______________________________________________________________________  Preparing for your procedure  During your procedure appointment there will be: No Prescription Refills. No disability issues to discussed. No medication changes or discussions.  Instructions: Food intake: Avoid eating anything solid for at least 8 hours prior to your procedure. Clear liquid intake: You may take clear liquids such as water up to 2 hours prior to your procedure. (No carbonated drinks. No soda.) Transportation: Unless otherwise stated by your physician, bring a driver. Morning Medicines: Except for blood thinners, take all of your other morning medications with a sip of water. Make sure to take your heart and blood pressure medicines. If your blood pressure's lower number is above 100, the case will be rescheduled. Blood thinners: Make sure to stop your blood thinners as instructed.  If you take a blood thinner, but were not instructed to stop it, call our office [phone] and ask to talk to a nurse. Not stopping a blood thinner prior to certain procedures could lead to serious complications. Diabetics on insulin: Notify the staff so that you can be scheduled 1st case in the morning. If your diabetes requires high dose insulin, take only  of your normal insulin dose the morning of the procedure and notify the staff that you have done so. Preventing infections: Shower with an antibacterial soap the morning of your procedure.  Build-up your immune system: Take 1000 mg of Vitamin C with every meal (3 times a day) the day prior to your procedure. Antibiotics: Inform the nursing staff if you are taking any antibiotics or if you have any conditions that may require antibiotics prior to procedures. (Example: recent joint implants)   Pregnancy: If you are pregnant make sure to notify the nursing staff. Not doing so may result in injury to the fetus, including death.  Sickness: If you have a cold, fever, or any  active infections, call and cancel or reschedule your procedure. Receiving steroids while having an infection may result in complications. Arrival: You must be in the facility at least 30 minutes prior to your scheduled procedure. Tardiness: Your scheduled time is also the cutoff time. If you do not arrive at least 15 minutes prior to your procedure, you will be rescheduled.  Children: Do not bring any children with you. Make arrangements to keep them home. Dress appropriately: There is always a possibility that your clothing may get soiled. Avoid long dresses. Valuables: Do not bring any jewelry or valuables.  Reasons to call and reschedule or cancel your procedure: (Following these recommendations will minimize the risk of a serious complication.) Surgeries: Avoid having procedures within 2 weeks of any surgery. (Avoid for 2 weeks before or after any surgery). Flu Shots: Avoid having procedures within 2 weeks of a flu shots or . (Avoid for 2 weeks before or after immunizations). Barium: Avoid having a procedure within 7-10 days after having had a radiological study involving the use of radiological contrast. (Myelograms, Barium swallow or enema study). Heart attacks: Avoid any elective procedures or surgeries for the initial 6 months after a "Myocardial Infarction" (Heart Attack). Blood thinners: It is imperative that you stop these medications before procedures. Let us know if you if you take any blood thinner.  Infection: Avoid procedures during or within two weeks of an infection (including chest colds or gastrointestinal problems). Symptoms associated with infections include: Localized redness, fever, chills, night sweats or profuse sweating, burning sensation when voiding, cough, congestion, stuffiness, runny nose, sore throat, diarrhea, nausea, vomiting, cold or Flu symptoms, recent or  current infections. It is specially important if the infection is over the area that we intend to treat. Heart  and lung problems: Symptoms that may suggest an active cardiopulmonary problem include: cough, chest pain, breathing difficulties or shortness of breath, dizziness, ankle swelling, uncontrolled high or unusually low blood pressure, and/or palpitations. If you are experiencing any of these symptoms, cancel your procedure and contact your primary care physician for an evaluation.  Remember:  Regular Business hours are:  Monday to Thursday 8:00 AM to 4:00 PM  Provider's Schedule: Milinda Pointer, MD:  Procedure days: Tuesday and Thursday 7:30 AM to 4:00 PM  Gillis Santa, MD:  Procedure days: Monday and Wednesday 7:30 AM to 4:00 PM  ______________________________________________________________________  Facet Joint Block The facet joints connect the bones of the spine (vertebrae). They let you bend, twist, and make other movements with your spine. They also keep you from bending too far, twisting too far, and making other extreme movements. A facet joint block is a procedure where a numbing medicine (local anesthesia) is injected into a facet joint. Many times, a medicine for inflammation (steroid) is also injected. A facet joint block may be done: To diagnose neck or back pain. If the pain gets better after a facet joint block, the pain is likely coming from the facet joint. If the pain does not get better, the pain is likely not coming from the facet joint. To treat neck or back pain caused by an inflamed facet joint. To help you with physical therapy or other rehab (rehabilitation) exercises. Tell a health care provider about: Any allergies you have. All medicines you are taking, including vitamins, herbs, eye drops, creams, and over-the-counter medicines. Any problems you or family members have had with anesthesia. Any bleeding problems you have. Any surgeries you have had. Any medical conditions you have or have had. Whether you are pregnant or may be pregnant. What are the  risks? Your health care provider will talk with you about risks. These may include: Infection. Allergic reactions to medicines or dyes. Bleeding. Injury to a nerve near where the needle was put in (injection site). Pain at the injection site. Short-term weakness or numbness in areas near the nerves at the injection site. What happens before the procedure? When to stop eating and drinking Follow instructions from your health care provider about what you may eat and drink. Medicines Ask your health care provider about: Changing or stopping your regular medicines. These include any diabetes medicines or blood thinners you take. Taking medicines such as aspirin and ibuprofen. These medicines can thin your blood. Do not take these medicines unless your health care provider tells you to. Taking over-the-counter medicines, vitamins, herbs, and supplements. General instructions If you will be going home right after the procedure, plan to have a responsible adult: Take you home from the hospital or clinic. You will not be allowed to drive. Care for you for the time you are told. Ask your health care provider: How your injection site will be marked. What steps will be taken to help prevent infection. These may include washing skin with a soap that kills germs. What happens during the procedure?  An IV will be inserted into one of your veins. You will lie on your stomach on an X-ray table. You may be asked to lie in a different position if you will be getting an injection in your neck. Your injection site will be cleaned with a soap that kills germs and then covered with  a germ-free (sterile) drape. A local anesthesia will be put in at the injection site. A type of X-ray machine (fluoroscopy) or CT scan will be used to help find your facet joint. A contrast dye may also be injected into your joint to help show if the needle is at the joint. When your provider knows the needle is at your joint,  they will inject anesthesia and anti-inflammatory medicine as needed. The needle will be removed. Pressure will be applied to keep your injection site from bleeding. A bandage (dressing) will be placed over each injection site. The procedure may vary among health care providers and hospitals. What happens after the procedure? Your blood pressure, heart rate, breathing rate, and blood oxygen level will be monitored until you leave the hospital or clinic. This information is not intended to replace advice given to you by your health care provider. Make sure you discuss any questions you have with your health care provider. Document Revised: 06/22/2022 Document Reviewed: 06/22/2022 Elsevier Patient Education  [place].

## 2023-02-14 ENCOUNTER — Telehealth: Payer: Self-pay | Admitting: Student in an Organized Health Care Education/Training Program

## 2023-02-14 NOTE — Telephone Encounter (Signed)
PT stated that she will be going to see her PCP doctor to follow up about her lungs. PT just wanted to make Dr. Holley Raring aware.TY

## 2023-02-15 ENCOUNTER — Encounter
Admission: RE | Disposition: A | Discharge status: Home / Self Care | Payer: Self-pay | Source: Home / Self Care | Attending: Gastroenterology

## 2023-02-15 ENCOUNTER — Ambulatory Visit: Payer: BC Managed Care – PPO | Admitting: Anesthesiology

## 2023-02-15 ENCOUNTER — Ambulatory Visit
Admission: RE | Admit: 2023-02-15 | Discharge: 2023-02-15 | Disposition: A | Discharge status: Home / Self Care | Payer: BC Managed Care – PPO | Attending: Gastroenterology | Admitting: Gastroenterology

## 2023-02-15 DIAGNOSIS — R1084 Generalized abdominal pain: Secondary | ICD-10-CM | POA: Insufficient documentation

## 2023-02-15 DIAGNOSIS — M797 Fibromyalgia: Secondary | ICD-10-CM | POA: Diagnosis not present

## 2023-02-15 DIAGNOSIS — Z6834 Body mass index (BMI) 34.0-34.9, adult: Secondary | ICD-10-CM | POA: Insufficient documentation

## 2023-02-15 DIAGNOSIS — K219 Gastro-esophageal reflux disease without esophagitis: Secondary | ICD-10-CM | POA: Insufficient documentation

## 2023-02-15 DIAGNOSIS — F32A Depression, unspecified: Secondary | ICD-10-CM | POA: Diagnosis not present

## 2023-02-15 DIAGNOSIS — F419 Anxiety disorder, unspecified: Secondary | ICD-10-CM | POA: Insufficient documentation

## 2023-02-15 DIAGNOSIS — G8929 Other chronic pain: Secondary | ICD-10-CM | POA: Diagnosis not present

## 2023-02-15 DIAGNOSIS — K5900 Constipation, unspecified: Secondary | ICD-10-CM | POA: Diagnosis not present

## 2023-02-15 DIAGNOSIS — G709 Myoneural disorder, unspecified: Secondary | ICD-10-CM | POA: Diagnosis not present

## 2023-02-15 DIAGNOSIS — K449 Diaphragmatic hernia without obstruction or gangrene: Secondary | ICD-10-CM | POA: Diagnosis not present

## 2023-02-15 DIAGNOSIS — K640 First degree hemorrhoids: Secondary | ICD-10-CM | POA: Insufficient documentation

## 2023-02-15 DIAGNOSIS — E669 Obesity, unspecified: Secondary | ICD-10-CM | POA: Diagnosis not present

## 2023-02-15 DIAGNOSIS — E039 Hypothyroidism, unspecified: Secondary | ICD-10-CM | POA: Diagnosis not present

## 2023-02-15 DIAGNOSIS — Z9049 Acquired absence of other specified parts of digestive tract: Secondary | ICD-10-CM | POA: Diagnosis not present

## 2023-02-15 DIAGNOSIS — M1990 Unspecified osteoarthritis, unspecified site: Secondary | ICD-10-CM | POA: Diagnosis not present

## 2023-02-15 DIAGNOSIS — D509 Iron deficiency anemia, unspecified: Secondary | ICD-10-CM | POA: Insufficient documentation

## 2023-02-15 DIAGNOSIS — J45909 Unspecified asthma, uncomplicated: Secondary | ICD-10-CM | POA: Diagnosis not present

## 2023-02-15 DIAGNOSIS — Z90710 Acquired absence of both cervix and uterus: Secondary | ICD-10-CM | POA: Diagnosis not present

## 2023-02-15 DIAGNOSIS — I10 Essential (primary) hypertension: Secondary | ICD-10-CM | POA: Diagnosis not present

## 2023-02-15 HISTORY — PX: COLONOSCOPY WITH PROPOFOL: SHX5780

## 2023-02-15 HISTORY — PX: ESOPHAGOGASTRODUODENOSCOPY (EGD) WITH PROPOFOL: SHX5813

## 2023-02-15 SURGERY — COLONOSCOPY WITH PROPOFOL
Anesthesia: General

## 2023-02-15 MED ORDER — PROPOFOL 10 MG/ML IV BOLUS
INTRAVENOUS | Status: DC | PRN
  Administered 2023-02-15: 70 mg via INTRAVENOUS
  Administered 2023-02-15: 40 mg via INTRAVENOUS
  Administered 2023-02-15: 30 mg via INTRAVENOUS

## 2023-02-15 MED ORDER — DEXMEDETOMIDINE HCL IN NACL 200 MCG/50ML IV SOLN
INTRAVENOUS | Status: DC | PRN
  Administered 2023-02-15: 12 ug via INTRAVENOUS

## 2023-02-15 MED ORDER — MIDAZOLAM HCL 2 MG/2ML IJ SOLN
INTRAMUSCULAR | Status: AC
  Filled 2023-02-15: qty 2

## 2023-02-15 MED ORDER — GLYCOPYRROLATE 0.2 MG/ML IJ SOLN
INTRAMUSCULAR | Status: DC | PRN
  Administered 2023-02-15: .2 mg via INTRAVENOUS

## 2023-02-15 MED ORDER — LIDOCAINE HCL (CARDIAC) PF 100 MG/5ML IV SOSY
PREFILLED_SYRINGE | INTRAVENOUS | Status: DC | PRN
  Administered 2023-02-15: 100 mg via INTRAVENOUS

## 2023-02-15 MED ORDER — MIDAZOLAM HCL 2 MG/2ML IJ SOLN
INTRAMUSCULAR | Status: DC | PRN
  Administered 2023-02-15: 2 mg via INTRAVENOUS

## 2023-02-15 MED ORDER — SODIUM CHLORIDE 0.9 % IV SOLN
INTRAVENOUS | Status: DC
  Administered 2023-02-15: 1000 mL via INTRAVENOUS

## 2023-02-15 MED ORDER — PROPOFOL 500 MG/50ML IV EMUL
INTRAVENOUS | Status: DC | PRN
  Administered 2023-02-15: 165 ug/kg/min via INTRAVENOUS

## 2023-02-15 NOTE — Interval H&P Note (Signed)
History and Physical Interval Note:  02/15/2023 10:34 AM  Valerie Welch  has presented today for surgery, with the diagnosis of IDA,BLACK STOOLS, Epigastric pain.  The various methods of treatment have been discussed with the patient and family. After consideration of risks, benefits and other options for treatment, the patient has consented to  Procedure(s): COLONOSCOPY WITH PROPOFOL (N/A) ESOPHAGOGASTRODUODENOSCOPY (EGD) WITH PROPOFOL (N/A) as a surgical intervention.  The patient's history has been reviewed, patient examined, no change in status, stable for surgery.  I have reviewed the patient's chart and labs.  Questions were answered to the patient's satisfaction.     Lesly Rubenstein  Ok to proceed with EGD/Colonoscopy

## 2023-02-15 NOTE — Anesthesia Postprocedure Evaluation (Signed)
Anesthesia Post Note  Patient: Valerie Welch  Procedure(s) Performed: COLONOSCOPY WITH PROPOFOL ESOPHAGOGASTRODUODENOSCOPY (EGD) WITH PROPOFOL  Anesthesia Type: General Anesthetic complications: no   No notable events documented.   Last Vitals:  Vitals:   02/15/23 1108 02/15/23 1139  BP: 120/76 128/85  Pulse:    Resp:    Temp: (!) 35.6 C   SpO2:  99%    Last Pain:  Vitals:   02/15/23 1108  TempSrc: Temporal  PainSc: Asleep                 Alphonsus Sias

## 2023-02-15 NOTE — Anesthesia Procedure Notes (Signed)
Procedure Name: General with mask airway Date/Time: 02/15/2023 10:46 AM  Performed by: Kelton Pillar, CRNAPre-anesthesia Checklist: Patient identified, Emergency Drugs available, Suction available and Patient being monitored Patient Re-evaluated:Patient Re-evaluated prior to induction Oxygen Delivery Method: Simple face mask Induction Type: IV induction Placement Confirmation: positive ETCO2, CO2 detector and breath sounds checked- equal and bilateral Dental Injury: Teeth and Oropharynx as per pre-operative assessment

## 2023-02-15 NOTE — H&P (Signed)
Outpatient short stay form Pre-procedure 02/15/2023  Valerie Rubenstein, MD  Primary Physician: Tracie Harrier, MD  Reason for visit:  Dyspepsia/Constipation  History of present illness:    52 y/o lady with history of obesity, hypertension, hypothyroidism, and chronic pain here for EGD/Colonoscopy for abdominal pain and severe constipation which was made worse by a hysterectomy. Last colonoscopy in 2021 with small TA. No blood thinners. Has also had cholecystectomy. No family history of GI malignancies.    Current Facility-Administered Medications:    0.9 %  sodium chloride infusion, , Intravenous, Continuous, Jace Dowe, Hilton Cork, MD, Last Rate: 20 mL/hr at 02/15/23 1006, 1,000 mL at 02/15/23 1006  Medications Prior to Admission  Medication Sig Dispense Refill Last Dose   ALPRAZolam (XANAX) 0.25 MG tablet Take 0.25 mg by mouth at bedtime as needed for anxiety or sleep.    02/14/2023   amLODipine (NORVASC) 5 MG tablet Take 5 mg by mouth daily.   02/14/2023   ASHWAGANDHA PO Take 200 mg by mouth at bedtime.   02/14/2023   azelastine (ASTELIN) 0.1 % nasal spray Place 1 spray into both nostrils daily as needed for allergies.    02/14/2023   Bepotastine Besilate (BEPREVE) 1.5 % SOLN Place 1 drop into both eyes in the morning and at bedtime.    02/14/2023   buPROPion (WELLBUTRIN XL) 300 MG 24 hr tablet Take 300 mg by mouth daily.   Past Week   cetirizine (ZYRTEC) 10 MG tablet Take 10 mg by mouth daily.   02/14/2023   Cyanocobalamin (B-12) 1000 MCG/ML KIT Inject 1,000 mcg as directed every 30 (thirty) days.   Past Week   diclofenac sodium (VOLTAREN) 1 % GEL Apply 1 application topically daily as needed (pain).    02/14/2023   DULoxetine (CYMBALTA) 60 MG capsule Take 1 capsule (60 mg total) by mouth daily. 90 capsule 1 02/14/2023   Fluticasone-Salmeterol (ADVAIR) 250-50 MCG/DOSE AEPB Inhale 1 puff into the lungs daily as needed (shortness of breath).    02/14/2023   gabapentin (NEURONTIN) 300 MG  capsule 300 mg in the morning, 300 mg in the afternoon, 600 mg nightly 120 capsule 5 02/14/2023   HYDROcodone-Acetaminophen 5-300 MG TABS Take by mouth daily as needed.   02/14/2023   iron polysaccharides (NIFEREX) 150 MG capsule Take 150 mg by mouth at bedtime.   02/14/2023   levothyroxine (SYNTHROID, LEVOTHROID) 25 MCG tablet Take 25 mcg by mouth daily before breakfast.   02/15/2023 at 0830   montelukast (SINGULAIR) 10 MG tablet Take 10 mg by mouth at bedtime.   02/14/2023   Multiple Vitamin (MULTIVITAMIN WITH MINERALS) TABS tablet Take 1 tablet by mouth daily at 12 noon.   02/14/2023   NON FORMULARY 500 mg. Friendly Hemp Extra Strength Pain Cream with CBD   02/14/2023   omeprazole (PRILOSEC) 20 MG capsule Take 20 mg by mouth 2 (two) times daily before a meal.    02/15/2023 at 0830   polyethylene glycol (MIRALAX / GLYCOLAX) 17 g packet Take 17 g by mouth daily.   02/14/2023   rosuvastatin (CRESTOR) 5 MG tablet Take 5 mg by mouth at bedtime.   [id number]   trolamine salicylate (ASPERCREME) 10 % cream Apply 1 application topically as needed for muscle pain.   02/14/2023   Docusate Sodium (DSS) 100 MG CAPS Take by mouth. (Patient not taking: Reported on 01/16/2023)        Allergies  Allergen Reactions   Beef-Derived Products Swelling   Other  Potato starch - lots of flem   Percocet [Oxycodone-Acetaminophen] Nausea And Vomiting   Pitocin [Oxytocin]     halucinations      Past Medical History:  Diagnosis Date   Anemia    Arthritis    Asthma    Barrett's esophagus    Bursitis    hip, knees, shoulders   Depression    Fibromyalgia    GERD (gastroesophageal reflux disease)    History of hiatal hernia    Hypothyroidism    Nontoxic uninodular goiter    Pernicious anemia     Review of systems:  Otherwise negative.    Physical Exam  Gen: Alert, oriented. Appears stated age.  HEENT: PERRLA. Lungs: No respiratory distress CV: RRR Abd: soft, benign, no masses Ext: No  edema    Planned procedures: Proceed with EGD/colonoscopy. The patient understands the nature of the planned procedure, indications, risks, alternatives and potential complications including but not limited to bleeding, infection, perforation, damage to internal organs and possible oversedation/side effects from anesthesia. The patient agrees and gives consent to proceed.  Please refer to procedure notes for findings, recommendations and patient disposition/instructions.     Valerie Rubenstein, MD [facility] Gastroenterology

## 2023-02-15 NOTE — Anesthesia Preprocedure Evaluation (Addendum)
Anesthesia Evaluation  Patient identified by MRN, date of birth, ID band Patient awake    Reviewed: Allergy & Precautions, NPO status , Patient's Chart, lab work & pertinent test results  Airway Mallampati: III  TM Distance: >3 FB Neck ROM: full    Dental  (+) Chipped   Pulmonary asthma   Pt states has had some mild URI sx's, feels fine today, CTA on exam Pulmonary exam normal breath sounds clear to auscultation       Cardiovascular negative cardio ROS Normal cardiovascular exam     Neuro/Psych  PSYCHIATRIC DISORDERS Anxiety Depression     Neuromuscular disease    GI/Hepatic Neg liver ROS, hiatal hernia,GERD  ,,  Endo/Other  Hypothyroidism    Renal/GU negative Renal ROS  negative genitourinary   Musculoskeletal  (+) Arthritis ,  Fibromyalgia -  Abdominal   Peds  Hematology  (+) Blood dyscrasia, anemia   Anesthesia Other Findings Past Medical History: No date: Anemia No date: Arthritis No date: Asthma No date: Barrett's esophagus No date: Bursitis     Comment:  hip, knees, shoulders No date: Depression No date: Fibromyalgia No date: GERD (gastroesophageal reflux disease) No date: History of hiatal hernia No date: Hypothyroidism No date: Nontoxic uninodular goiter No date: Pernicious anemia  Past Surgical History: 09/26/2018: ANKLE ARTHROSCOPY; Right     Comment:  Procedure: ANKLE ARTHROSCOPY/OCD REPAIR  &  DEBRIDEMENT;              EXTENSIVE;  Surgeon: [name], DPM;  Location: [facility]              ORS;  Service: Podiatry;  Laterality: Right; 09/26/2018: ARTHRODESIS METATARSALPHALANGEAL JOINT (MTPJ); Right     Comment:  Procedure: ARTHRODESIS METATARSALPHALANGEAL JOINT (MTPJ)              FUSION;  Surgeon: [name], DPM;  Location: [facility]               ORS;  Service: Podiatry;  Laterality: Right; No date: CESAREAN SECTION     Comment:  x2 No date: CHOLECYSTECTOMY No date: CHOLECYSTECTOMY,  LAPAROSCOPIC 06/16/2020: COLONOSCOPY WITH PROPOFOL; N/A     Comment:  Procedure: COLONOSCOPY WITH PROPOFOL;  Surgeon: [place],               Benay Pike, MD;  Location: [facility] ENDOSCOPY;  Service:               Gastroenterology;  Laterality: N/A; 06/16/2020: ESOPHAGOGASTRODUODENOSCOPY (EGD) WITH PROPOFOL; N/A     Comment:  Procedure: ESOPHAGOGASTRODUODENOSCOPY (EGD) WITH               PROPOFOL;  Surgeon: Toledo, Benay Pike, MD;  Location:               [facility] ENDOSCOPY;  Service: Gastroenterology;  Laterality:               N/A; 09/26/2018: EXOSTECTECTOMY TOE; Right     Comment:  Procedure: [id number] TOE-2ND MTPJ;  Surgeon:               [name], DPM;  Location: [facility] ORS;  Service:               Podiatry;  Laterality: Right; 07/05/2020: ROBOTIC ASSISTED LAPAROSCOPIC HYSTERECTOMY AND  SALPINGECTOMY; N/A     Comment:  Procedure: XI ROBOTIC ASSISTED TOTAL LAPAROSCOPIC               HYSTERECTOMY AND SALPINGECTOMY, omental adhesion and  bladder and uteralvesicle adhesions, IUD REMOVAL;                Surgeon: Princess Bruins, MD;  Location: [place];  Service: Gynecology;  Laterality: N/A;                request 8:30am OR time in [place] Gyn block requests               two hours 09/26/2018: TENOTOMY / [place]; Right     Comment:  Procedure: FLEXOR TENDON TRANSFER; DEEP;  Surgeon:               [name], DPM;  Location: [facility] ORS;  Service:               Podiatry;  Laterality: Right;     Reproductive/Obstetrics negative OB ROS                             Anesthesia Physical Anesthesia Plan  ASA: 2  Anesthesia Plan: General   Post-op Pain Management:    Induction: Intravenous  PONV Risk Score and Plan: Propofol infusion and TIVA  Airway Management Planned: Natural Airway and Nasal Cannula  Additional Equipment:   Intra-op Plan:   Post-operative Plan:   Informed Consent: I  have reviewed the patients History and Physical, chart, labs and discussed the procedure including the risks, benefits and alternatives for the proposed anesthesia with the patient or authorized representative who has indicated his/her understanding and acceptance.     Dental Advisory Given  Plan Discussed with: Anesthesiologist, CRNA and Surgeon  Anesthesia Plan Comments: (Patient consented for risks of anesthesia including but not limited to:  - adverse reactions to medications - risk of airway placement if required - damage to eyes, teeth, lips or other oral mucosa - nerve damage due to positioning  - sore throat or hoarseness - Damage to heart, brain, nerves, lungs, other parts of body or loss of life  Patient voiced understanding.)        Anesthesia Quick Evaluation

## 2023-02-15 NOTE — Op Note (Signed)
[facility] Gastroenterology Patient Name: Valerie Welch Procedure Date: 02/15/2023 10:19 AM MRN: [id number] Account #: [id number] Date of Birth: 1971-08-26 Admit Type: Outpatient Age: 52 Room: [facility] ENDO ROOM 3 Gender: Female Note Status: Supervisor Override Instrument Name: Jasper Riling [id number] Procedure:             Colonoscopy Indications:           Iron deficiency anemia, Constipation Providers:             Andrey Farmer MD, MD Medicines:             Monitored Anesthesia Care Complications:         No immediate complications. Procedure:             Pre-Anesthesia Assessment:                        - Prior to the procedure, a History and Physical was                         performed, and patient medications and allergies were                         reviewed. The patient is competent. The risks and                         benefits of the procedure and the sedation options and                         risks were discussed with the patient. All questions                         were answered and informed consent was obtained.                         Patient identification and proposed procedure were                         verified by the physician, the nurse, the                         anesthesiologist, the anesthetist and the technician                         in the endoscopy suite. Mental Status Examination:                         alert and oriented. Airway Examination: normal                         oropharyngeal airway and neck mobility. Respiratory                         Examination: clear to auscultation. CV Examination:                         normal. Prophylactic Antibiotics: The patient does not                         require prophylactic antibiotics. Prior  Anticoagulants: The patient has taken no anticoagulant                         or antiplatelet agents. ASA Grade Assessment: II - A                         patient  with mild systemic disease. After reviewing                         the risks and benefits, the patient was deemed in                         satisfactory condition to undergo the procedure. The                         anesthesia plan was to use monitored anesthesia care                         (MAC). Immediately prior to administration of                         medications, the patient was re-assessed for adequacy                         to receive sedatives. The heart rate, respiratory                         rate, oxygen saturations, blood pressure, adequacy of                         pulmonary ventilation, and response to care were                         monitored throughout the procedure. The physical                         status of the patient was re-assessed after the                         procedure.                        After obtaining informed consent, the colonoscope was                         passed under direct vision. Throughout the procedure,                         the patient's blood pressure, pulse, and oxygen                         saturations were monitored continuously. The                         Colonoscope was introduced through the anus and                         advanced to the the terminal ileum. The colonoscopy  was somewhat difficult due to inadequate bowel prep.                         The patient tolerated the procedure well. The quality                         of the bowel preparation was inadequate. The ileocecal                         valve, appendiceal orifice, and rectum were                         photographed. Findings:      The perianal and digital rectal examinations were normal.      The terminal ileum appeared normal.      Internal hemorrhoids were found during retroflexion. The hemorrhoids       were Grade I (internal hemorrhoids that do not prolapse).      The exam was otherwise without abnormality on direct  and retroflexion       views. Impression:            - Preparation of the colon was inadequate.                        - Internal hemorrhoids.                        - The examination was otherwise normal on direct and                         retroflexion views.                        - No specimens collected. Recommendation:        - Discharge patient to home.                        - Resume previous diet.                        - Continue present medications.                        - Repeat colonoscopy in 1 year because the bowel                         preparation was suboptimal.                        - Return to referring physician as previously                         scheduled. Will need to work on constipation, possibly                         with anorectal manometry. Procedure Code(s):     --- Professional ---                        [phone], Colonoscopy, flexible; diagnostic, including  collection of specimen(s) by brushing or washing, when                         performed (separate procedure) Diagnosis Code(s):     --- Professional ---                        K64.0, First degree hemorrhoids                        K59.00, Constipation, unspecified CPT copyright 2022 American Medical Association. All rights reserved. The codes documented in this report are preliminary and upon coder review may  be revised to meet current compliance requirements. Andrey Farmer MD, MD 02/15/2023 11:25:07 AM Number of Addenda: 0 Note Initiated On: 02/15/2023 10:19 AM Scope Withdrawal Time: 0 hours 6 minutes 32 seconds  Total Procedure Duration: 0 hours 12 minutes 54 seconds  Estimated Blood Loss:  Estimated blood loss: none.      [facility]

## 2023-02-15 NOTE — Op Note (Signed)
[facility]enter[facility] Patient Name: Valerie Welch Procedure Date: 02/15/2023 10:19 AM MRN: [id number] Account #: [id number] Date of Birth: 1971-02-06 Admit Type: Outpatient Age: 52 Room: [facility] ENDO ROOM 3 Gender: Female Note Status: Supervisor Override Instrument Name: Altamese Cabal Endoscope [id number] Procedure:             Upper GI endoscopy Indications:           Generalized abdominal pain, Iron deficiency anemia Providers:             Andrey Farmer MD, MD Medicines:             Monitored Anesthesia Care Complications:         No immediate complications. Procedure:             Pre-Anesthesia Assessment:                        - Prior to the procedure, a History and Physical was                         performed, and patient medications and allergies were                         reviewed. The patient is competent. The risks and                         benefits of the procedure and the sedation options and                         risks were discussed with the patient. All questions                         were answered and informed consent was obtained.                         Patient identification and proposed procedure were                         verified by the physician, the nurse, the                         anesthesiologist, the anesthetist and the technician                         in the endoscopy suite. Mental Status Examination:                         alert and oriented. Airway Examination: normal                         oropharyngeal airway and neck mobility. Respiratory                         Examination: clear to auscultation. CV Examination:                         normal. Prophylactic Antibiotics: The patient does not                         require prophylactic antibiotics. Prior  Anticoagulants: The patient has taken no anticoagulant                         or antiplatelet agents. ASA Grade Assessment: II - A                          patient with mild systemic disease. After reviewing                         the risks and benefits, the patient was deemed in                         satisfactory condition to undergo the procedure. The                         anesthesia plan was to use monitored anesthesia care                         (MAC). Immediately prior to administration of                         medications, the patient was re-assessed for adequacy                         to receive sedatives. The heart rate, respiratory                         rate, oxygen saturations, blood pressure, adequacy of                         pulmonary ventilation, and response to care were                         monitored throughout the procedure. The physical                         status of the patient was re-assessed after the                         procedure.                        After obtaining informed consent, the endoscope was                         passed under direct vision. Throughout the procedure,                         the patient's blood pressure, pulse, and oxygen                         saturations were monitored continuously. The Endoscope                         was introduced through the mouth, and advanced to the                         second part of duodenum. The upper GI endoscopy was  accomplished without difficulty. The patient tolerated                         the procedure well. Findings:      The examined esophagus was normal.      A large hiatal hernia was present as evidenced by distortion of normal       stomach architecture, required looping in order to reach gastric antrum      The examined duodenum was normal. Impression:            - Normal esophagus.                        - Large hiatal hernia.                        - Normal examined duodenum.                        - No specimens collected. Recommendation:        - Discharge patient to home.                         - Resume previous diet.                        - Continue present medications.                        - Return to referring physician as previously                         scheduled. Procedure Code(s):     --- Professional ---                        [phone], Esophagogastroduodenoscopy, flexible,                         transoral; diagnostic, including collection of                         specimen(s) by brushing or washing, when performed                         (separate procedure) Diagnosis Code(s):     --- Professional ---                        K44.9, Diaphragmatic hernia without obstruction or                         gangrene                        R10.84, Generalized abdominal pain CPT copyright 2022 American Medical Association. All rights reserved. The codes documented in this report are preliminary and upon coder review may  be revised to meet current compliance requirements. Andrey Farmer MD, MD 02/15/2023 11:15:08 AM Number of Addenda: 0 Note Initiated On: 02/15/2023 10:19 AM Estimated Blood Loss:  Estimated blood loss: none.      [facility]

## 2023-02-15 NOTE — Transfer of Care (Signed)
Immediate Anesthesia Transfer of Care Note  Patient: Valerie Welch  Procedure(s) Performed: COLONOSCOPY WITH PROPOFOL ESOPHAGOGASTRODUODENOSCOPY (EGD) WITH PROPOFOL  Patient Location: Endoscopy Unit  Anesthesia Type:General  Level of Consciousness: drowsy and patient cooperative  Airway & Oxygen Therapy: Patient Spontanous Breathing and Patient connected to face mask oxygen  Post-op Assessment: Report given to RN and Post -op Vital signs reviewed and stable  Post vital signs: Reviewed and stable  Last Vitals:  Vitals Value Taken Time  BP 120/76 02/15/23 1108  Temp 35.6 C 02/15/23 1108  Pulse 100 02/15/23 1111  Resp 17 02/15/23 1111  SpO2 98 % 02/15/23 1111  Vitals shown include unvalidated device data.  Last Pain:  Vitals:   02/15/23 1108  TempSrc: Temporal  PainSc: Asleep         Complications: No notable events documented.

## 2023-02-18 ENCOUNTER — Encounter: Payer: Self-pay | Admitting: Gastroenterology

## 2023-03-13 ENCOUNTER — Ambulatory Visit
Admission: RE | Admit: 2023-03-13 | Discharge: 2023-03-13 | Disposition: A | Discharge status: Home / Self Care | Payer: BC Managed Care – PPO | Source: Ambulatory Visit | Attending: Obstetrics & Gynecology | Admitting: Obstetrics & Gynecology

## 2023-03-13 DIAGNOSIS — Z1231 Encounter for screening mammogram for malignant neoplasm of breast: Secondary | ICD-10-CM

## 2023-03-20 ENCOUNTER — Ambulatory Visit
Admission: RE | Admit: 2023-03-20 | Discharge: 2023-03-20 | Disposition: A | Discharge status: Home / Self Care | Payer: BC Managed Care – PPO | Source: Ambulatory Visit | Attending: Student in an Organized Health Care Education/Training Program | Admitting: Student in an Organized Health Care Education/Training Program

## 2023-03-20 ENCOUNTER — Ambulatory Visit
Payer: BC Managed Care – PPO | Attending: Student in an Organized Health Care Education/Training Program | Admitting: Student in an Organized Health Care Education/Training Program

## 2023-03-20 ENCOUNTER — Encounter: Payer: Self-pay | Admitting: Student in an Organized Health Care Education/Training Program

## 2023-03-20 VITALS — BP 150/108 | HR 81 | Temp 97.0°F | Resp 18 | Ht 61.0 in | Wt 181.0 lb

## 2023-03-20 DIAGNOSIS — M47816 Spondylosis without myelopathy or radiculopathy, lumbar region: Secondary | ICD-10-CM | POA: Insufficient documentation

## 2023-03-20 DIAGNOSIS — G894 Chronic pain syndrome: Secondary | ICD-10-CM

## 2023-03-20 MED ORDER — LIDOCAINE HCL 2 % IJ SOLN
20.0000 mL | Freq: Once | INTRAMUSCULAR | Status: AC
  Administered 2023-03-20: 400 mg

## 2023-03-20 MED ORDER — LIDOCAINE HCL 2 % IJ SOLN
INTRAMUSCULAR | Status: AC
  Filled 2023-03-20: qty 20

## 2023-03-20 MED ORDER — DIAZEPAM 5 MG PO TABS
ORAL_TABLET | ORAL | Status: AC
  Filled 2023-03-20: qty 1

## 2023-03-20 MED ORDER — DEXAMETHASONE SODIUM PHOSPHATE 10 MG/ML IJ SOLN
10.0000 mg | Freq: Once | INTRAMUSCULAR | Status: AC
  Administered 2023-03-20: 10 mg

## 2023-03-20 MED ORDER — ROPIVACAINE HCL 2 MG/ML IJ SOLN
9.0000 mL | Freq: Once | INTRAMUSCULAR | Status: AC
  Administered 2023-03-20: 9 mL via PERINEURAL

## 2023-03-20 MED ORDER — DEXAMETHASONE SODIUM PHOSPHATE 10 MG/ML IJ SOLN
INTRAMUSCULAR | Status: AC
  Filled 2023-03-20: qty 2

## 2023-03-20 MED ORDER — ROPIVACAINE HCL 2 MG/ML IJ SOLN
INTRAMUSCULAR | Status: AC
  Filled 2023-03-20: qty 20

## 2023-03-20 NOTE — Progress Notes (Signed)
Safety precautions to be maintained throughout the outpatient stay will include: orient to surroundings, keep bed in low position, maintain call bell within reach at all times, provide assistance with transfer out of bed and ambulation.  

## 2023-03-20 NOTE — Progress Notes (Signed)
PROVIDER NOTE: Interpretation of information contained herein should be left to medically-trained personnel. Specific patient instructions are provided elsewhere under "Patient Instructions" section of medical record. This document was created in part using STT-dictation technology, any transcriptional errors that may result from this process are unintentional.  Patient: Valerie Welch Type: Established DOB: [birth year] MRN: [id number] PCP: Tracie Harrier, MD  Service: Procedure DOS: 03/20/2023 Setting: Ambulatory Location: Ambulatory outpatient facility Delivery: Face-to-face Provider: Gillis Santa, MD Specialty: Interventional Pain Management Specialty designation: 09 Location: Outpatient facility Ref. Prov.: Tracie Harrier, MD    Procedure:           Type: Lumbar Facet, Medial Branch Block(s) #2  Laterality: Bilateral  Level: L3, L4, and L5 Medial Branch Level(s). Injecting these levels blocks the L3-4 and L4-5 lumbar facet joints. Imaging: Fluoroscopic guidance         Anesthesia: Local anesthesia (1-2% Lidocaine) Anxiolysis: Oral Valium         DOS: 03/20/2023 Performed by: Gillis Santa, MD  Primary Purpose: Diagnostic/Therapeutic Indications: Low back pain severe enough to impact quality of life or function. 1. Lumbar facet arthropathy   2. Chronic pain syndrome    NAS-11 Pain score:   Pre-procedure: 5 /10   Post-procedure: 5 /10     Position / Prep / Materials:  Position: Prone  Prep solution: DuraPrep (Iodine Povacrylex [0.7% available iodine] and Isopropyl Alcohol, 74% w/w) Area Prepped: Posterolateral Lumbosacral Spine (Wide prep: From the lower border of the scapula down to the end of the tailbone and from flank to flank.)  Materials:  Tray: Block Needle(s):  Type: Spinal  Gauge (G): 22  Length: 3.5-in Qty: 2     Pre-op H&P Assessment:  Valerie Welch is a 52 y.o. (year old), female patient, seen today for interventional treatment. She  has a  past surgical history that includes Cesarean section; Cholecystectomy, laparoscopic; Ankle arthroscopy (Right, 09/26/2018); Tenotomy / flexor tendon transfer (Right, 09/26/2018); Arthrodesis metatarsalphalangeal joint (mtpj) (Right, 09/26/2018); Exostectectomy toe (Right, 09/26/2018); Cholecystectomy; Colonoscopy with propofol (N/A, 06/16/2020); Esophagogastroduodenoscopy (egd) with propofol (N/A, 06/16/2020); Robotic assisted laparoscopic hysterectomy and salpingectomy (N/A, 07/05/2020); Colonoscopy with propofol (N/A, 02/15/2023); and Esophagogastroduodenoscopy (egd) with propofol (N/A, 02/15/2023). Valerie Welch has a current medication list which includes the following prescription(s): albuterol, alprazolam, amlodipine, ashwagandha, azelastine, bepreve, bupropion, cetirizine, b-12, diclofenac sodium, dss, duloxetine, fluconazole, fluticasone-salmeterol, gabapentin, hydrocodone-acetaminophen, iron polysaccharides, levothyroxine, montelukast, multivitamin with minerals, NON FORMULARY, omeprazole, polyethylene glycol, rosuvastatin, and trolamine salicylate. Her primarily concern today is the Back Pain (Lower back and right hip)  Initial Vital Signs:  Pulse/HCG Rate: 81ECG Heart Rate: 76 Temp: (!) 97 F (36.1 C) Resp: 18 BP: 119/84 SpO2: 97 %  BMI: Estimated body mass index is 34.2 kg/m as calculated from the following:   Height as of this encounter: 5\' 1"  (1.549 m).   Weight as of this encounter: 181 lb (82.1 kg).  Risk Assessment: Allergies: Reviewed. She is allergic to beef-derived products, other, percocet [oxycodone-acetaminophen], and pitocin [oxytocin].  Allergy Precautions: None required Coagulopathies: Reviewed. None identified.  Blood-thinner therapy: None at this time Active Infection(s): Reviewed. None identified. Valerie Welch is afebrile  Site Confirmation: Valerie Welch was asked to confirm the procedure and laterality before marking the site Procedure checklist:  Completed Consent: Before the procedure and under the influence of no sedative(s), amnesic(s), or anxiolytics, the patient was informed of the treatment options, risks and possible complications. To fulfill our ethical and legal obligations, as recommended by the American Medical Association's Code of Ethics, I have  informed the patient of my clinical impression; the nature and purpose of the treatment or procedure; the risks, benefits, and possible complications of the intervention; the alternatives, including doing nothing; the risk(s) and benefit(s) of the alternative treatment(s) or procedure(s); and the risk(s) and benefit(s) of doing nothing. The patient was provided information about the general risks and possible complications associated with the procedure. These may include, but are not limited to: failure to achieve desired goals, infection, bleeding, organ or nerve damage, allergic reactions, paralysis, and death. In addition, the patient was informed of those risks and complications associated to Spine-related procedures, such as failure to decrease pain; infection (i.e.: Meningitis, epidural or intraspinal abscess); bleeding (i.e.: epidural hematoma, subarachnoid hemorrhage, or any other type of intraspinal or peri-dural bleeding); organ or nerve damage (i.e.: Any type of peripheral nerve, nerve root, or spinal cord injury) with subsequent damage to sensory, motor, and/or autonomic systems, resulting in permanent pain, numbness, and/or weakness of one or several areas of the body; allergic reactions; (i.e.: anaphylactic reaction); and/or death. Furthermore, the patient was informed of those risks and complications associated with the medications. These include, but are not limited to: allergic reactions (i.e.: anaphylactic or anaphylactoid reaction(s)); adrenal axis suppression; blood sugar elevation that in diabetics may result in ketoacidosis or comma; water retention that in patients with history  of congestive heart failure may result in shortness of breath, pulmonary edema, and decompensation with resultant heart failure; weight gain; swelling or edema; medication-induced neural toxicity; particulate matter embolism and blood vessel occlusion with resultant organ, and/or nervous system infarction; and/or aseptic necrosis of one or more joints. Finally, the patient was informed that Medicine is not an exact science; therefore, there is also the possibility of unforeseen or unpredictable risks and/or possible complications that may result in a catastrophic outcome. The patient indicated having understood very clearly. We have given the patient no guarantees and we have made no promises. Enough time was given to the patient to ask questions, all of which were answered to the patient's satisfaction. Ms. Ferrell has indicated that she wanted to continue with the procedure. Attestation: I, the ordering provider, attest that I have discussed with the patient the benefits, risks, side-effects, alternatives, likelihood of achieving goals, and potential problems during recovery for the procedure that I have provided informed consent. Date  Time: 03/20/2023 11:30 AM  Pre-Procedure Preparation:  Monitoring: As per clinic protocol. Respiration, ETCO2, SpO2, BP, heart rate and rhythm monitor placed and checked for adequate function Safety Precautions: Patient was assessed for positional comfort and pressure points before starting the procedure. Time-out: I initiated and conducted the "Time-out" before starting the procedure, as per protocol. The patient was asked to participate by confirming the accuracy of the "Time Out" information. Verification of the correct person, site, and procedure were performed and confirmed by me, the nursing staff, and the patient. "Time-out" conducted as per Joint Commission's Universal Protocol (UP.01.01.01). Time: 1159  Description of Procedure:          Laterality:  Bilateral. The procedure was performed in identical fashion on both sides. Targeted Levels: L3, L4, and L5  Medial Branch Level(s)  Safety Precautions: Aspiration looking for blood return was conducted prior to all injections. At no point did we inject any substances, as a needle was being advanced. Before injecting, the patient was told to immediately notify me if she was experiencing any new onset of "ringing in the ears, or metallic taste in the mouth". No attempts were made at seeking  any paresthesias. Safe injection practices and needle disposal techniques used. Medications properly checked for expiration dates. SDV (single dose vial) medications used. After the completion of the procedure, all disposable equipment used was discarded in the proper designated medical waste containers. Local Anesthesia: Protocol guidelines were followed. The patient was positioned over the fluoroscopy table. The area was prepped in the usual manner. The time-out was completed. The target area was identified using fluoroscopy. A 12-in long, straight, sterile hemostat was used with fluoroscopic guidance to locate the targets for each level blocked. Once located, the skin was marked with an approved surgical skin marker. Once all sites were marked, the skin (epidermis, dermis, and hypodermis), as well as deeper tissues (fat, connective tissue and muscle) were infiltrated with a small amount of a short-acting local anesthetic, loaded on a 10cc syringe with a 25G, 1.5-in  Needle. An appropriate amount of time was allowed for local anesthetics to take effect before proceeding to the next step. Local Anesthetic: Lidocaine 2.0% The unused portion of the local anesthetic was discarded in the proper designated containers. Technical description of process:  L3 Medial Branch Nerve Block (MBB): The target area for the L3 medial branch is at the junction of the postero-lateral aspect of the superior articular process and the superior,  posterior, and medial edge of the transverse process of L4. Under fluoroscopic guidance, a Quincke needle was inserted until contact was made with os over the superior postero-lateral aspect of the pedicular shadow (target area). After negative aspiration for blood, 1.50mL of the nerve block solution was injected without difficulty or complication. The needle was removed intact. L4 Medial Branch Nerve Block (MBB): The target area for the L4 medial branch is at the junction of the postero-lateral aspect of the superior articular process and the superior, posterior, and medial edge of the transverse process of L5. Under fluoroscopic guidance, a Quincke needle was inserted until contact was made with os over the superior postero-lateral aspect of the pedicular shadow (target area). After negative aspiration for blood, 1.5 mL of the nerve block solution was injected without difficulty or complication. The needle was removed intact. L5 Medial Branch Nerve Block (MBB): The target area for the L5 medial branch is at the junction of the postero-lateral aspect of the superior articular process and the superior, posterior, and medial edge of the sacral ala. Under fluoroscopic guidance, a Quincke needle was inserted until contact was made with os over the superior postero-lateral aspect of the pedicular shadow (target area). After negative aspiration for blood,1.33mL of the nerve block solution was injected without difficulty or complication. The needle was removed intact.   10 cc solution made of 8cc of 0.2% ropivacaine, 2 cc of Decadron 10 mg/cc.  1.5 cc injected at each level above bilaterally.   Once the entire procedure was completed, the treated area was cleaned, making sure to leave some of the prepping solution back to take advantage of its long term bactericidal properties.         Illustration of the posterior view of the lumbar spine and the posterior neural structures. Laminae of L2 through S1 are  labeled. DPRL5, dorsal primary ramus of L5; DPRS1, dorsal primary ramus of S1; DPR3, dorsal primary ramus of L3; FJ, facet (zygapophyseal) joint L3-L4; I, inferior articular process of L4; LB1, lateral branch of dorsal primary ramus of L1; IAB, inferior articular branches from L3 medial branch (supplies L4-L5 facet joint); IBP, intermediate branch plexus; MB3, medial branch of dorsal primary ramus of  L3; NR3, third lumbar nerve root; S, superior articular process of L5; SAB, superior articular branches from L4 (supplies L4-5 facet joint also); TP3, transverse process of L3.  Vitals:   03/20/23 1156 03/20/23 1200 03/20/23 1205 03/20/23 1210  BP: (!) 146/110 (!) 133/100 (!) 133/100 (!) 150/108  Pulse:      Resp: 16 18 16 18   Temp:      TempSrc:      SpO2: 100% 97% 95% 95%  Weight:      Height:         Start Time: 1159 hrs. End Time: 1210 hrs.  Imaging Guidance (Spinal):          Type of Imaging Technique: Fluoroscopy Guidance (Spinal) Indication(s): Assistance in needle guidance and placement for procedures requiring needle placement in or near specific anatomical locations not easily accessible without such assistance. Exposure Time: Please see nurses notes. Contrast: None used. Fluoroscopic Guidance: I was personally present during the use of fluoroscopy. "Tunnel Vision Technique" used to obtain the best possible view of the target area. Parallax error corrected before commencing the procedure. "Direction-depth-direction" technique used to introduce the needle under continuous pulsed fluoroscopy. Once target was reached, antero-posterior, oblique, and lateral fluoroscopic projection used confirm needle placement in all planes. Images permanently stored in EMR. Interpretation: No contrast injected. I personally interpreted the imaging intraoperatively. Adequate needle placement confirmed in multiple planes. Permanent images saved into the patient's record.  Post-operative Assessment:   Post-procedure Vital Signs:  Pulse/HCG Rate: 8179 Temp: (!) 97 F (36.1 C) Resp: 18 BP: (!) 150/108 SpO2: 95 %  EBL: None  Complications: No immediate post-treatment complications observed by team, or reported by patient.  Note: The patient tolerated the entire procedure well. A repeat set of vitals were taken after the procedure and the patient was kept under observation following institutional policy, for this type of procedure. Post-procedural neurological assessment was performed, showing return to baseline, prior to discharge. The patient was provided with post-procedure discharge instructions, including a section on how to identify potential problems. Should any problems arise concerning this procedure, the patient was given instructions to immediately contact us, at any time, without hesitation. In any case, we plan to contact the patient by telephone for a follow-up status report regarding this interventional procedure.  Comments:  No additional relevant information.  Plan of Care  Orders:  Orders Placed This Encounter  Procedures   DG PAIN [facility] C-ARM 1-60 MIN NO REPORT    Intraoperative interpretation by procedural physician at [place].    Standing Status:   Standing    Number of Occurrences:   1    Order Specific Question:   Reason for exam:    Answer:   Assistance in needle guidance and placement for procedures requiring needle placement in or near specific anatomical locations not easily accessible without such assistance.    Medications ordered for procedure: Meds ordered this encounter  Medications   lidocaine (XYLOCAINE) 2 % (with pres) injection 400 mg   dexamethasone (DECADRON) injection 10 mg   dexamethasone (DECADRON) injection 10 mg   ropivacaine (PF) 2 mg/mL (0.2%) (NAROPIN) injection 9 mL   ropivacaine (PF) 2 mg/mL (0.2%) (NAROPIN) injection 9 mL   Medications administered: We administered lidocaine, dexamethasone, dexamethasone,  ropivacaine (PF) 2 mg/mL (0.2%), and ropivacaine (PF) 2 mg/mL (0.2%).  See the medical record for exact dosing, route, and time of administration.  Follow-up plan:   Return in about 6 weeks (around 05/01/2023) for f25f ppe.  Right lower extremity CRPS, lumbar sympathetic nerve block denied by insurance.  Right L4-5 ESI #2 01/01/22, #3 02/28/22, #4 11/16/22, Consider spinal cord stimulator trial in future.  Bilateral L3, L4, L5 medial branch nerve block 01/16/2023, 03/20/23      Recent Visits Date Type Provider Dept  02/13/23 Office Visit Gillis Santa, MD [facility]  01/16/23 Procedure visit Gillis Santa, MD [facility]  01/02/23 Office Visit Gillis Santa, MD [facility]  Showing recent visits within past 90 days and meeting all other requirements Today's Visits Date Type Provider Dept  03/20/23 Procedure visit Gillis Santa, MD [facility]  Showing today's visits and meeting all other requirements Future Appointments Date Type Provider Dept  05/01/23 Appointment Gillis Santa, MD [facility]  Showing future appointments within next 90 days and meeting all other requirements  Disposition: Discharge home  Discharge (Date  Time): 03/20/2023; 1225 hrs.   Primary Care Physician: Tracie Harrier, MD Location: [facility] Note by: Gillis Santa, MD Date: 03/20/2023; Time: 12:51 PM  Disclaimer:  Medicine is not an exact science. The only guarantee in medicine is that nothing is guaranteed. It is important to note that the decision to proceed with this intervention was based on the information collected from the patient. The Data and conclusions were drawn from the patient's questionnaire, the interview, and the physical examination. Because the information was provided in large part by the patient, it cannot be guaranteed that it has not been purposely or unconsciously manipulated. Every effort has been made to  obtain as much relevant data as possible for this evaluation. It is important to note that the conclusions that lead to this procedure are derived in large part from the available data. Always take into account that the treatment will also be dependent on availability of resources and existing treatment guidelines, considered by other Pain Management Practitioners as being common knowledge and practice, at the time of the intervention. For Medico-Legal purposes, it is also important to point out that variation in procedural techniques and pharmacological choices are the acceptable norm. The indications, contraindications, technique, and results of the above procedure should only be interpreted and judged by a Board-Certified Interventional Pain Specialist with extensive familiarity and expertise in the same exact procedure and technique.

## 2023-03-20 NOTE — Patient Instructions (Signed)
Pain Management Discharge Instructions  General Discharge Instructions :  If you need to reach your doctor call: Monday-Friday 8:00 am - 4:00 pm at [phone] or toll free [phone].  After clinic hours [phone] to have operator reach doctor.  Bring all of your medication bottles to all your appointments in the pain clinic.  To cancel or reschedule your appointment with Pain Management please remember to call 24 hours in advance to avoid a fee.  Refer to the educational materials which you have been given on: General Risks, I had my Procedure. Discharge Instructions, Post Sedation.  Post Procedure Instructions:  The drugs you were given will stay in your system until tomorrow, so for the next 24 hours you should not drive, make any legal decisions or drink any alcoholic beverages.  You may eat anything you prefer, but it is better to start with liquids then soups and crackers, and gradually work up to solid foods.  Please notify your doctor immediately if you have any unusual bleeding, trouble breathing or pain that is not related to your normal pain.  Depending on the type of procedure that was done, some parts of your body may feel week and/or numb.  This usually clears up by tonight or the next day.  Walk with the use of an assistive device or accompanied by an adult for the 24 hours.  You may use ice on the affected area for the first 24 hours.  Put ice in a Ziploc bag and cover with a towel and place against area 15 minutes on 15 minutes off.  You may switch to heat after 24 hours.Facet Blocks Patient Information  Description: The facets are joints in the spine between the vertebrae.  Like any joints in the body, facets can become irritated and painful.  Arthritis can also effect the facets.  By injecting steroids and local anesthetic in and around these joints, we can temporarily block the nerve supply to them.  Steroids act directly on irritated nerves and tissues to  reduce selling and inflammation which often leads to decreased pain.  Facet blocks may be done anywhere along the spine from the neck to the low back depending upon the location of your pain.   After numbing the skin with local anesthetic (like Novocaine), a small needle is passed onto the facet joints under x-ray guidance.  You may experience a sensation of pressure while this is being done.  The entire block usually lasts about 15-25 minutes.   Conditions which may be treated by facet blocks:  Low back/buttock pain Neck/shoulder pain Certain types of headaches  Preparation for the injection:  Do not eat any solid food or dairy products within 8 hours of your appointment. You may drink clear liquid up to 3 hours before appointment.  Clear liquids include water, black coffee, juice or soda.  No milk or cream please. You may take your regular medication, including pain medications, with a sip of water before your appointment.  Diabetics should hold regular insulin (if taken separately) and take 1/2 normal NPH dose the morning of the procedure.  Carry some sugar containing items with you to your appointment. A driver must accompany you and be prepared to drive you home after your procedure. Bring all your current medications with you. An IV may be inserted and sedation may be given at the discretion of the physician. A blood pressure cuff, EKG and other monitors will often be applied during the procedure.  Some patients may need to   have extra oxygen administered for a short period. You will be asked to provide medical information, including your allergies and medications, prior to the procedure.  We must know immediately if you are taking blood thinners (like Coumadin/Warfarin) or if you are allergic to IV iodine contrast (dye).  We must know if you could possible be pregnant.  Possible side-effects:  Bleeding from needle site Infection (rare, may require surgery) Nerve injury (rare) Numbness  & tingling (temporary) Difficulty urinating (rare, temporary) Spinal headache (a headache worse with upright posture) Light-headedness (temporary) Pain at injection site (serveral days) Decreased blood pressure (rare, temporary) Weakness in arm/leg (temporary) Pressure sensation in back/neck (temporary)   Call if you experience:  Fever/chills associated with headache or increased back/neck pain Headache worsened by an upright position New onset, weakness or numbness of an extremity below the injection site Hives or difficulty breathing (go to the emergency room) Inflammation or drainage at the injection site(s) Severe back/neck pain greater than usual New symptoms which are concerning to you  Please note:  Although the local anesthetic injected can often make your back or neck feel good for several hours after the injection, the pain will likely return. It takes 3-7 days for steroids to work.  You may not notice any pain relief for at least one week.  If effective, we will often do a series of 2-3 injections spaced 3-6 weeks apart to maximally decrease your pain.  After the initial series, you may be a candidate for a more permanent nerve block of the facets.  If you have any questions, please call [phone] [facility] 

## 2023-03-21 ENCOUNTER — Telehealth: Payer: Self-pay

## 2023-03-21 NOTE — Telephone Encounter (Signed)
Attempt call to patient to discuss post-procedure. No answer and LVM to call pain clinic for any concerns.   Al Decant, RN

## 2023-04-09 ENCOUNTER — Other Ambulatory Visit: Payer: Self-pay | Admitting: Student in an Organized Health Care Education/Training Program

## 2023-04-09 DIAGNOSIS — G5793 Unspecified mononeuropathy of bilateral lower limbs: Secondary | ICD-10-CM

## 2023-04-09 DIAGNOSIS — M5416 Radiculopathy, lumbar region: Secondary | ICD-10-CM

## 2023-04-09 DIAGNOSIS — M792 Neuralgia and neuritis, unspecified: Secondary | ICD-10-CM

## 2023-04-09 DIAGNOSIS — G5771 Causalgia of right lower limb: Secondary | ICD-10-CM

## 2023-04-09 DIAGNOSIS — G894 Chronic pain syndrome: Secondary | ICD-10-CM

## 2023-05-01 ENCOUNTER — Encounter: Payer: Self-pay | Admitting: Student in an Organized Health Care Education/Training Program

## 2023-05-01 ENCOUNTER — Ambulatory Visit
Payer: BC Managed Care – PPO | Attending: Student in an Organized Health Care Education/Training Program | Admitting: Student in an Organized Health Care Education/Training Program

## 2023-05-01 VITALS — BP 141/95 | HR 84 | Temp 98.2°F | Resp 16 | Ht 61.0 in | Wt 184.0 lb

## 2023-05-01 DIAGNOSIS — G894 Chronic pain syndrome: Secondary | ICD-10-CM | POA: Insufficient documentation

## 2023-05-01 DIAGNOSIS — M47816 Spondylosis without myelopathy or radiculopathy, lumbar region: Secondary | ICD-10-CM | POA: Diagnosis not present

## 2023-05-01 NOTE — Progress Notes (Signed)
PROVIDER NOTE: Information contained herein reflects review and annotations entered in association with encounter. Interpretation of such information and data should be left to medically-trained personnel. Information provided to patient can be located elsewhere in the medical record under "Patient Instructions". Document created using STT-dictation technology, any transcriptional errors that may result from process are unintentional.    Patient: Valerie Welch  Service Category: E/M  Provider: Edward Jolly, MD  DOB: 10/05/1971  DOS: 05/01/2023  Referring Provider: Barbette Reichmann, MD  MRN: [id number]  Specialty: Interventional Pain Management  PCP: Barbette Reichmann, MD  Type: Established Patient  Setting: Ambulatory outpatient    Location: Office  Delivery: Face-to-face     HPI  Ms. MADAILEIN DEGUIRE, a 52 y.o. year old female, is here today because of her Lumbar facet arthropathy [M47.816]. Ms. Henk primary complain today is Hip Pain (Right, radiates over to the tail bone. )  Pertinent problems: Ms. Taffe has History of foot surgery; Generalized anxiety disorder; Neuropathic pain of both legs; Complex regional pain syndrome type II of right lower limb; and Neuropathic pain of right foot on their pertinent problem list. Pain Assessment: Severity of Chronic pain is reported as a 6 /10. Location: Hip Right/into tailbone. Onset: More than a month ago. Quality: Discomfort, Constant, Other (Comment), Burning (fire and stiffness). Timing: Constant. Modifying factor(s): hot showers, cool showers, topicals. Vitals:  height is 5\' 1"  (1.549 m) and weight is 184 lb (83.5 kg). Her temporal temperature is 98.2 F (36.8 C). Her blood pressure is 141/95 (abnormal) and her pulse is 84. Her respiration is 16 and oxygen saturation is 100%.  BMI: Estimated body mass index is 34.77 kg/m as calculated from the following:   Height as of this encounter: 5\' 1"  (1.549 m).   Weight as of this  encounter: 184 lb (83.5 kg). Last encounter: 02/13/2023. Last procedure: 03/20/2023.  Reason for encounter: post-procedure evaluation and assessment.    Post-procedure evaluation   Type: Lumbar Facet, Medial Branch Block(s) #2  Laterality: Bilateral  Level: L3, L4, and L5 Medial Branch Level(s). Injecting these levels blocks the L3-4 and L4-5 lumbar facet joints. Imaging: Fluoroscopic guidance         Anesthesia: Local anesthesia (1-2% Lidocaine) Anxiolysis: Oral Valium         DOS: 03/20/2023 Performed by: Edward Jolly, MD  Primary Purpose: Diagnostic/Therapeutic Indications: Low back pain severe enough to impact quality of life or function. 1. Lumbar facet arthropathy   2. Chronic pain syndrome    NAS-11 Pain score:   Pre-procedure: 5 /10   Post-procedure: 5 /10      Effectiveness:  Initial hour after procedure: 0 %  Subsequent 4-6 hours post-procedure: 0 %  Analgesia past initial 6 hours: 90 % (pain returned last week and has manifested in right hip); 90% pain relief for approx 4 weeks with gradual return Ongoing improvement:  Analgesic:  60% pain relief on left, 30% on right currently Function: Ms. Lenington reports improvement in function ROM: Ms. Louro reports improvement in ROM    ROS  Constitutional: Denies any fever or chills Gastrointestinal: No reported hemesis, hematochezia, vomiting, or acute GI distress Musculoskeletal:  low back pain with radiation to right hip Neurological: No reported episodes of acute onset apraxia, aphasia, dysarthria, agnosia, amnesia, paralysis, loss of coordination, or loss of consciousness  Medication Review  ALPRAZolam, Ashwagandha, B-12, Bepotastine Besilate, DSS, DULoxetine, Fluticasone-Salmeterol, HYDROcodone-Acetaminophen, NON FORMULARY, albuterol, amLODipine, azelastine, buPROPion, cetirizine, diclofenac sodium, fluconazole, gabapentin, iron polysaccharides, levothyroxine, montelukast, multivitamin with minerals,  omeprazole, polyethylene glycol, promethazine, rosuvastatin, and trolamine salicylate  History Review  Allergy: Ms. Deruyter is allergic to beef-derived products, other, percocet [oxycodone-acetaminophen], and pitocin [oxytocin]. Drug: Ms. Lebeda  reports no history of drug use. Alcohol:  reports current alcohol use. Tobacco:  reports that she has never smoked. She has never used smokeless tobacco. Social: Ms. Hung  reports that she has never smoked. She has never used smokeless tobacco. She reports current alcohol use. She reports that she does not use drugs. Medical:  has a past medical history of Anemia, Arthritis, Asthma, Barrett's esophagus, Bursitis, Depression, Fibromyalgia, GERD (gastroesophageal reflux disease), History of hiatal hernia, Hypothyroidism, Nontoxic uninodular goiter, and Pernicious anemia. Surgical: Ms. Bolstad  has a past surgical history that includes Cesarean section; Cholecystectomy, laparoscopic; Ankle arthroscopy (Right, 09/26/2018); Tenotomy / flexor tendon transfer (Right, 09/26/2018); Arthrodesis metatarsalphalangeal joint (mtpj) (Right, 09/26/2018); Exostectectomy toe (Right, 09/26/2018); Cholecystectomy; Colonoscopy with propofol (N/A, 06/16/2020); Esophagogastroduodenoscopy (egd) with propofol (N/A, 06/16/2020); Robotic assisted laparoscopic hysterectomy and salpingectomy (N/A, 07/05/2020); Colonoscopy with propofol (N/A, 02/15/2023); and Esophagogastroduodenoscopy (egd) with propofol (N/A, 02/15/2023). Family: family history includes Cancer in her paternal grandfather; Diabetes in her mother; Heart disease in her mother; Hypertension in her maternal grandfather; Stroke in her brother, father, and mother.  Laboratory Chemistry Profile   Renal Lab Results  Component Value Date   BUN 7 07/01/2020   CREATININE 1.00 07/01/2020   GFRAA >60 07/01/2020   GFRNONAA >60 07/01/2020    Hepatic No results found for: "AST", "ALT", "ALBUMIN", "ALKPHOS",  "HCVAB", "AMYLASE", "LIPASE", "AMMONIA"  Electrolytes Lab Results  Component Value Date   NA 139 07/01/2020   K 4.5 07/01/2020   CL 108 07/01/2020   CALCIUM 9.2 07/01/2020    Bone No results found for: "VD25OH", "VD125OH2TOT", "ZO1096EA5", "WU9811BJ4", "25OHVITD1", "25OHVITD2", "25OHVITD3", "TESTOFREE", "TESTOSTERONE"  Inflammation (CRP: Acute Phase) (ESR: Chronic Phase) No results found for: "CRP", "ESRSEDRATE", "LATICACIDVEN"       Note: Above Lab results reviewed.  Physical Exam  General appearance: Well nourished, well developed, and well hydrated. In no apparent acute distress Mental status: Alert, oriented x 3 (person, place, & time)       Respiratory: No evidence of acute respiratory distress Eyes: PERLA Vitals: BP (!) 141/95 (BP Location: Right Arm, Patient Position: Sitting, Cuff Size: Normal)   Pulse 84   Temp 98.2 F (36.8 C) (Temporal)   Resp 16   Ht 5\' 1"  (1.549 m)   Wt 184 lb (83.5 kg)   LMP 09/10/2019   SpO2 100%   BMI 34.77 kg/m  BMI: Estimated body mass index is 34.77 kg/m as calculated from the following:   Height as of this encounter: 5\' 1"  (1.549 m).   Weight as of this encounter: 184 lb (83.5 kg). Ideal: Ideal body weight: 47.8 kg (105 lb 6.1 oz) Adjusted ideal body weight: 62.1 kg (136 lb 13.2 oz)  Lumbar Spine Area Exam  Skin & Axial Inspection: No masses, redness, or swelling Alignment: Symmetrical Functional ROM: Pain restricted ROM       however improved from before Stability: No instability detected Muscle Tone/Strength: Functionally intact. No obvious neuro-muscular anomalies detected. Sensory (Neurological): Improved Palpation: No palpable anomalies       Provocative Tests: Hyperextension/rotation test: (+) bilaterally for facet joint pain. Lumbar quadrant test (Kemp's test): (+) bilaterally for facet joint pain.   Gait & Posture Assessment  Ambulation: Unassisted Gait: Relatively normal for age and body habitus Posture: WNL  Lower  Extremity Exam      Side:  Right lower extremity   Side: Left lower extremity  Stability: No instability observed           Stability: No instability observed          Skin & Extremity Inspection: Skin color, temperature, and hair growth are WNL. No peripheral edema or cyanosis. No masses, redness, swelling, asymmetry, or associated skin lesions. No contractures.   Skin & Extremity Inspection: Skin color, temperature, and hair growth are WNL. No peripheral edema or cyanosis. No masses, redness, swelling, asymmetry, or associated skin lesions. No contractures.  Functional ROM: Unrestricted ROM                   Functional ROM: Unrestricted ROM                  Muscle Tone/Strength: Functionally intact. No obvious neuro-muscular anomalies detected.   Muscle Tone/Strength: Functionally intact. No obvious neuro-muscular anomalies detected.  Sensory (Neurological): Unimpaired         Sensory (Neurological): Unimpaired        DTR: Patellar: deferred today Achilles: deferred today Plantar: deferred today   DTR: Patellar: deferred today Achilles: deferred today Plantar: deferred today  Palpation: No palpable anomalies   Palpation: No palpable anomalies    Assessment   Diagnosis Status  1. Lumbar facet arthropathy   2. Lumbar spondylosis   3. Chronic pain syndrome    Responding Responding Persistent   Updated Problems: Problem  Lumbar Facet Arthropathy    Plan of Care    Ms. QUANA MCLANAHAN has a current medication list which includes the following long-term medication(s): amlodipine, azelastine, cetirizine, duloxetine, gabapentin, iron polysaccharides, levothyroxine, montelukast, omeprazole, and promethazine.  Positive response to 2 sets of diagnostic lumbar MBNBs L3,4,5 Discussed lumbar RFA for therapeutic purposes, start with right side first  Orders:  Orders Placed This Encounter  Procedures   Radiofrequency,Lumbar    Standing Status:   Future    Standing Expiration  Date:   08/01/2023    Scheduling Instructions:     Side(s): RIGHT     Level(s):L3, L4, L5, Medial Branch Nerve(s)     Sedation: With Sedation     Scheduling Timeframe: As soon as pre-approved    Order Specific Question:   Where will this procedure be performed?    Answer:   [facility]   Radiofrequency,Lumbar    Standing Status:   Future    Standing Expiration Date:   08/01/2023    Scheduling Instructions:     Side(s):LEFT     Level(s): L3, L4, L5, Medial Branch Nerve(s)     Sedation: With Sedation     Scheduling Timeframe: 2 weeks after right    Order Specific Question:   Where will this procedure be performed?    Answer:   [facility]   Follow-up plan:   Return in about 26 days (around 05/27/2023) for Right L3, 4, 5 RFA, in clinic IV Versed.      Right lower extremity CRPS, lumbar sympathetic nerve block denied by insurance.  Right L4-5 ESI #2 01/01/22, #3 02/28/22, #4 11/16/22, Consider spinal cord stimulator trial in future.  Bilateral L3, L4, L5 medial branch nerve block 01/16/2023, 03/20/23        Recent Visits Date Type Provider Dept  03/20/23 Procedure visit Edward Jolly, MD [facility]  02/13/23 Office Visit Edward Jolly, MD [facility]  Showing recent visits within past 90 days and meeting all other requirements Today's Visits Date Type  Provider Dept  05/01/23 Office Visit Edward Jolly, MD [facility]  Showing today's visits and meeting all other requirements Future Appointments No visits were found meeting these conditions. Showing future appointments within next 90 days and meeting all other requirements  I discussed the assessment and treatment plan with the patient. The patient was provided an opportunity to ask questions and all were answered. The patient agreed with the plan and demonstrated an understanding of the instructions.  Patient advised to call back or seek an in-person evaluation if the symptoms or condition  worsens.  Duration of encounter: .  Total time on encounter, as per AMA guidelines included both the face-to-face and non-face-to-face time personally spent by the physician and/or other qualified health care professional(s) on the day of the encounter (includes time in activities that require the physician or other qualified health care professional and does not include time in activities normally performed by clinical staff). Physician's time may include the following activities when performed: Preparing to see the patient (e.g., pre-charting review of records, searching for previously ordered imaging, lab work, and nerve conduction tests) Review of prior analgesic pharmacotherapies. Reviewing PMP Interpreting ordered tests (e.g., lab work, imaging, nerve conduction tests) Performing post-procedure evaluations, including interpretation of diagnostic procedures Obtaining and/or reviewing separately obtained history Performing a medically appropriate examination and/or evaluation Counseling and educating the patient/family/caregiver Ordering medications, tests, or procedures Referring and communicating with other health care professionals (when not separately reported) Documenting clinical information in the electronic or other health record Independently interpreting results (not separately reported) and communicating results to the patient/ family/caregiver Care coordination (not separately reported)  Note by: Edward Jolly, MD Date: 05/01/2023; Time: 3:42 PM

## 2023-05-01 NOTE — Progress Notes (Signed)
Safety precautions to be maintained throughout the outpatient stay will include: orient to surroundings, keep bed in low position, maintain call bell within reach at all times, provide assistance with transfer out of bed and ambulation.  

## 2023-05-01 NOTE — Patient Instructions (Signed)
____________________________________________________________________________________________  General Risks and Possible Complications  Patient Responsibilities: It is important that you read this as it is part of your informed consent. It is our duty to inform you of the risks and possible complications associated with treatments offered to you. It is your responsibility as a patient to read this and to ask questions about anything that is not clear or that you believe was not covered in this document.  Patient's Rights: You have the right to refuse treatment. You also have the right to change your mind, even after initially having agreed to have the treatment done. However, under this last option, if you wait until the last second to change your mind, you may be charged for the materials used up to that point.  Introduction: Medicine is not an exact science. Everything in Medicine, including the lack of treatment(s), carries the potential for danger, harm, or loss (which is by definition: Risk). In Medicine, a complication is a secondary problem, condition, or disease that can aggravate an already existing one. All treatments carry the risk of possible complications. The fact that a side effects or complications occurs, does not imply that the treatment was conducted incorrectly. It must be clearly understood that these can happen even when everything is done following the highest safety standards.  No treatment: You can choose not to proceed with the proposed treatment alternative. The "PRO(s)" would include: avoiding the risk of complications associated with the therapy. The "CON(s)" would include: not getting any of the treatment benefits. These benefits fall under one of three categories: diagnostic; therapeutic; and/or palliative. Diagnostic benefits include: getting information which can ultimately lead to improvement of the disease or symptom(s). Therapeutic benefits are those associated with  the successful treatment of the disease. Finally, palliative benefits are those related to the decrease of the primary symptoms, without necessarily curing the condition (example: decreasing the pain from a flare-up of a chronic condition, such as incurable terminal cancer).  General Risks and Complications: These are associated to most interventional treatments. They can occur alone, or in combination. They fall under one of the following six (6) categories: no benefit or worsening of symptoms; bleeding; infection; nerve damage; allergic reactions; and/or death. No benefits or worsening of symptoms: In Medicine there are no guarantees, only probabilities. No healthcare provider can ever guarantee that a medical treatment will work, they can only state the probability that it may. Furthermore, there is always the possibility that the condition may worsen, either directly, or indirectly, as a consequence of the treatment. Bleeding: This is more common if the patient is taking a blood thinner, either prescription or over the counter (example: Goody Powders, Fish oil, Aspirin, Garlic, etc.), or if suffering a condition associated with impaired coagulation (example: Hemophilia, cirrhosis of the liver, low platelet counts, etc.). However, even if you do not have one on these, it can still happen. If you have any of these conditions, or take one of these drugs, make sure to notify your treating physician. Infection: This is more common in patients with a compromised immune system, either due to disease (example: diabetes, cancer, human immunodeficiency virus [HIV], etc.), or due to medications or treatments (example: therapies used to treat cancer and rheumatological diseases). However, even if you do not have one on these, it can still happen. If you have any of these conditions, or take one of these drugs, make sure to notify your treating physician. Nerve Damage: This is more common when the treatment is an    invasive one, but it can also happen with the use of medications, such as those used in the treatment of cancer. The damage can occur to small secondary nerves, or to large primary ones, such as those in the spinal cord and brain. This damage may be temporary or permanent and it may lead to impairments that can range from temporary numbness to permanent paralysis and/or brain death. Allergic Reactions: Any time a substance or material comes in contact with our body, there is the possibility of an allergic reaction. These can range from a mild skin rash (contact dermatitis) to a severe systemic reaction (anaphylactic reaction), which can result in death. Death: In general, any medical intervention can result in death, most of the time due to an unforeseen complication. ____________________________________________________________________________________________    ______________________________________________________________________  Preparing for your procedure  Appointments: If you think you may not be able to keep your appointment, call 24-48 hours in advance to cancel. We need time to make it available to others.  During your procedure appointment there will be: No Prescription Refills. No disability issues to discussed. No medication changes or discussions.  Instructions: Food intake: Avoid eating anything solid for at least 8 hours prior to your procedure. Clear liquid intake: You may take clear liquids such as water up to 2 hours prior to your procedure. (No carbonated drinks. No soda.) Transportation: Unless otherwise stated by your physician, bring a driver. Morning Medicines: Except for blood thinners, take all of your other morning medications with a sip of water. Make sure to take your heart and blood pressure medicines. If your blood pressure's lower number is above 100, the case will be rescheduled. Blood thinners: Make sure to stop your blood thinners as instructed.  If you take  a blood thinner, but were not instructed to stop it, call our office [phone] and ask to talk to a nurse. Not stopping a blood thinner prior to certain procedures could lead to serious complications. Diabetics on insulin: Notify the staff so that you can be scheduled 1st case in the morning. If your diabetes requires high dose insulin, take only  of your normal insulin dose the morning of the procedure and notify the staff that you have done so. Preventing infections: Shower with an antibacterial soap the morning of your procedure.  Build-up your immune system: Take 1000 mg of Vitamin C with every meal (3 times a day) the day prior to your procedure. Antibiotics: Inform the nursing staff if you are taking any antibiotics or if you have any conditions that may require antibiotics prior to procedures. (Example: recent joint implants)   Pregnancy: If you are pregnant make sure to notify the nursing staff. Not doing so may result in injury to the fetus, including death.  Sickness: If you have a cold, fever, or any active infections, call and cancel or reschedule your procedure. Receiving steroids while having an infection may result in complications. Arrival: You must be in the facility at least 30 minutes prior to your scheduled procedure. Tardiness: Your scheduled time is also the cutoff time. If you do not arrive at least 15 minutes prior to your procedure, you will be rescheduled.  Children: Do not bring any children with you. Make arrangements to keep them home. Dress appropriately: There is always a possibility that your clothing may get soiled. Avoid long dresses. Valuables: Do not bring any jewelry or valuables.  Reasons to call and reschedule or cancel your procedure: (Following these recommendations will minimize the risk   of a serious complication.) Surgeries: Avoid having procedures within 2 weeks of any surgery. (Avoid for 2 weeks before or after any surgery). Flu Shots: Avoid having  procedures within 2 weeks of a flu shots or . (Avoid for 2 weeks before or after immunizations). Barium: Avoid having a procedure within 7-10 days after having had a radiological study involving the use of radiological contrast. (Myelograms, Barium swallow or enema study). Heart attacks: Avoid any elective procedures or surgeries for the initial 6 months after a "Myocardial Infarction" (Heart Attack). Blood thinners: It is imperative that you stop these medications before procedures. Let us know if you if you take any blood thinner.  Infection: Avoid procedures during or within two weeks of an infection (including chest colds or gastrointestinal problems). Symptoms associated with infections include: Localized redness, fever, chills, night sweats or profuse sweating, burning sensation when voiding, cough, congestion, stuffiness, runny nose, sore throat, diarrhea, nausea, vomiting, cold or Flu symptoms, recent or current infections. It is specially important if the infection is over the area that we intend to treat. Heart and lung problems: Symptoms that may suggest an active cardiopulmonary problem include: cough, chest pain, breathing difficulties or shortness of breath, dizziness, ankle swelling, uncontrolled high or unusually low blood pressure, and/or palpitations. If you are experiencing any of these symptoms, cancel your procedure and contact your primary care physician for an evaluation.  Remember:  Regular Business hours are:  Monday to Thursday 8:00 AM to 4:00 PM  Provider's Schedule: Francisco Naveira, MD:  Procedure days: Tuesday and Thursday 7:30 AM to 4:00 PM  Bilal Lateef, MD:  Procedure days: Monday and Wednesday 7:30 AM to 4:00 PM  ______________________________________________________________________   Facet Blocks Patient Information  Description: The facets are joints in the spine between the vertebrae.  Like any joints in the body, facets can become irritated and painful.   Arthritis can also effect the facets.  By injecting steroids and local anesthetic in and around these joints, we can temporarily block the nerve supply to them.  Steroids act directly on irritated nerves and tissues to reduce selling and inflammation which often leads to decreased pain.  Facet blocks may be done anywhere along the spine from the neck to the low back depending upon the location of your pain.   After numbing the skin with local anesthetic (like Novocaine), a small needle is passed onto the facet joints under x-ray guidance.  You may experience a sensation of pressure while this is being done.  The entire block usually lasts about 15-25 minutes.   Conditions which may be treated by facet blocks:  Low back/buttock pain Neck/shoulder pain Certain types of headaches  Preparation for the injection:  Do not eat any solid food or dairy products within 8 hours of your appointment. You may drink clear liquid up to 3 hours before appointment.  Clear liquids include water, black coffee, juice or soda.  No milk or cream please. You may take your regular medication, including pain medications, with a sip of water before your appointment.  Diabetics should hold regular insulin (if taken separately) and take 1/2 normal NPH dose the morning of the procedure.  Carry some sugar containing items with you to your appointment. A driver must accompany you and be prepared to drive you home after your procedure. Bring all your current medications with you. An IV may be inserted and sedation may be given at the discretion of the physician. A blood pressure cuff, EKG and other monitors will   often be applied during the procedure.  Some patients may need to have extra oxygen administered for a short period. You will be asked to provide medical information, including your allergies and medications, prior to the procedure.  We must know immediately if you are taking blood thinners (like Coumadin/Warfarin) or if  you are allergic to IV iodine contrast (dye).  We must know if you could possible be pregnant.  Possible side-effects:  Bleeding from needle site Infection (rare, may require surgery) Nerve injury (rare) Numbness & tingling (temporary) Difficulty urinating (rare, temporary) Spinal headache (a headache worse with upright posture) Light-headedness (temporary) Pain at injection site (serveral days) Decreased blood pressure (rare, temporary) Weakness in arm/leg (temporary) Pressure sensation in back/neck (temporary)   Call if you experience:  Fever/chills associated with headache or increased back/neck pain Headache worsened by an upright position New onset, weakness or numbness of an extremity below the injection site Hives or difficulty breathing (go to the emergency room) Inflammation or drainage at the injection site(s) Severe back/neck pain greater than usual New symptoms which are concerning to you  Please note:  Although the local anesthetic injected can often make your back or neck feel good for several hours after the injection, the pain will likely return. It takes 3-7 days for steroids to work.  You may not notice any pain relief for at least one week.  If effective, we will often do a series of 2-3 injections spaced 3-6 weeks apart to maximally decrease your pain.  After the initial series, you may be a candidate for a more permanent nerve block of the facets.  If you have any questions, please call [phone] [facility] 

## 2023-05-17 ENCOUNTER — Other Ambulatory Visit: Payer: Self-pay | Admitting: Internal Medicine

## 2023-05-17 DIAGNOSIS — R053 Chronic cough: Secondary | ICD-10-CM

## 2023-05-22 ENCOUNTER — Ambulatory Visit
Admission: RE | Admit: 2023-05-22 | Discharge: 2023-05-22 | Disposition: A | Discharge status: Home / Self Care | Payer: BC Managed Care – PPO | Source: Ambulatory Visit | Attending: Internal Medicine | Admitting: Internal Medicine

## 2023-05-22 DIAGNOSIS — R053 Chronic cough: Secondary | ICD-10-CM | POA: Diagnosis present

## 2023-05-22 MED ORDER — IOHEXOL 300 MG/ML  SOLN
75.0000 mL | Freq: Once | INTRAMUSCULAR | Status: AC | PRN
  Administered 2023-05-22: 75 mL via INTRAVENOUS

## 2023-05-27 ENCOUNTER — Ambulatory Visit
Admission: RE | Admit: 2023-05-27 | Discharge: 2023-05-27 | Disposition: A | Discharge status: Home / Self Care | Payer: BC Managed Care – PPO | Source: Ambulatory Visit | Attending: Student in an Organized Health Care Education/Training Program | Admitting: Student in an Organized Health Care Education/Training Program

## 2023-05-27 ENCOUNTER — Ambulatory Visit
Payer: BC Managed Care – PPO | Attending: Student in an Organized Health Care Education/Training Program | Admitting: Student in an Organized Health Care Education/Training Program

## 2023-05-27 ENCOUNTER — Encounter: Payer: Self-pay | Admitting: Student in an Organized Health Care Education/Training Program

## 2023-05-27 DIAGNOSIS — G894 Chronic pain syndrome: Secondary | ICD-10-CM

## 2023-05-27 DIAGNOSIS — M47816 Spondylosis without myelopathy or radiculopathy, lumbar region: Secondary | ICD-10-CM | POA: Insufficient documentation

## 2023-05-27 MED ORDER — DEXAMETHASONE SODIUM PHOSPHATE 10 MG/ML IJ SOLN
10.0000 mg | Freq: Once | INTRAMUSCULAR | Status: AC
  Administered 2023-05-27: 10 mg
  Filled 2023-05-27: qty 1

## 2023-05-27 MED ORDER — ROPIVACAINE HCL 2 MG/ML IJ SOLN
9.0000 mL | Freq: Once | INTRAMUSCULAR | Status: AC
  Administered 2023-05-27: 9 mL via PERINEURAL
  Filled 2023-05-27: qty 20

## 2023-05-27 MED ORDER — IOHEXOL 180 MG/ML  SOLN
10.0000 mL | Freq: Once | INTRAMUSCULAR | Status: DC
  Filled 2023-05-27: qty 20

## 2023-05-27 MED ORDER — LIDOCAINE HCL 2 % IJ SOLN
20.0000 mL | Freq: Once | INTRAMUSCULAR | Status: AC
  Administered 2023-05-27: 100 mg
  Filled 2023-05-27: qty 40

## 2023-05-27 MED ORDER — MIDAZOLAM HCL 2 MG/2ML IJ SOLN
0.5000 mg | Freq: Once | INTRAMUSCULAR | Status: AC
  Administered 2023-05-27: 2 mg via INTRAVENOUS
  Filled 2023-05-27: qty 2

## 2023-05-27 NOTE — Patient Instructions (Signed)
___________________________________________________________________________________________  Post-Radiofrequency (RF) Discharge Instructions  You have just completed a Radiofrequency Neurotomy.  The following instructions will provide you with information and guidelines for self-care upon discharge.  If at any time you have questions or concerns please call your physician. DO NOT DRIVE YOURSELF!!  Instructions: Apply ice: Fill a plastic sandwich bag with crushed ice. Cover it with a small towel and apply to injection site. Apply for 15 minutes then remove x 15 minutes. Repeat sequence on day of procedure, until you go to bed. The purpose is to minimize swelling and discomfort after procedure. Apply heat: Apply heat to procedure site starting the day following the procedure. The purpose is to treat any soreness and discomfort from the procedure. Food intake: No eating limitations, unless stipulated above.  Nevertheless, if you have had sedation, you may experience some nausea.  In this case, it may be wise to wait at least two hours prior to resuming regular diet. Physical activities: Keep activities to a minimum for the first 8 hours after the procedure. For the first 24 hours after the procedure, do not drive a motor vehicle,  Operate heavy machinery, power tools, or handle any weapons.  Consider walking with the use of an assistive device or accompanied by an adult for the first 24 hours.  Do not drink alcoholic beverages including beer.  Do not make any important decisions or sign any legal documents. Go home and rest today.  Resume activities tomorrow, as tolerated.  Use caution in moving about as you may experience mild leg weakness.  Use caution in cooking, use of household electrical appliances and climbing steps. Driving: If you have received any sedation, you are not allowed to drive for 24 hours after your procedure. Blood thinner: Restart your blood thinner 6 hours after your procedure.  (Only for those taking blood thinners) Insulin: As soon as you can eat, you may resume your normal dosing schedule. (Only for those taking insulin) Medications: May resume pre-procedure medications.  Do not take any drugs, other than what has been prescribed to you. Infection prevention: Keep procedure site clean and dry. Post-procedure Pain Diary: Extremely important that this be done correctly and accurately. Recorded information will be used to determine the next step in treatment. Pain evaluated is that of treated area only. Do not include pain from an untreated area. Complete every hour, on the hour, for the initial 8 hours. Set an alarm to help you do this part accurately. Do not go to sleep and have it completed later. It will not be accurate. Follow-up appointment: Keep your follow-up appointment after the procedure. Usually 2-6 weeks after radiofrequency. Bring you pain diary. The information collected will be essential for your long-term care.   Expect: From numbing medicine (AKA: Local Anesthetics): Numbness or decrease in pain. Onset: Full effect within 15 minutes of injected. Duration: It will depend on the type of local anesthetic used. On the average, 1 to 8 hours.  From steroids (when added): Decrease in swelling or inflammation. Once inflammation is improved, relief of the pain will follow. Onset of benefits: Depends on the amount of swelling present. The more swelling, the longer it will take for the benefits to be seen. In some cases, up to 10 days. Duration: Steroids will stay in the system x 2 weeks. Duration of benefits will depend on multiple posibilities including persistent irritating factors. From procedure: Some discomfort is to be expected once the numbing medicine wears off. In the case of radiofrequency procedures,  this may last as long as 6 weeks. Additional post-procedure pain medication is provided for this. Discomfort is minimized if ice and heat are applied as  instructed.  Call if: You experience numbness and weakness that gets worse with time, as opposed to wearing off. He experience any unusual bleeding, difficulty breathing, or loss of the ability to control your bowel and bladder. (This applies to Spinal procedures only) You experience any redness, swelling, heat, red streaks, elevated temperature, fever, or any other signs of a possible infection.  Emergency Numbers: Durning business hours (Monday - Thursday, 8:00 AM - 4:00 PM) (Friday, 9:00 AM - 12:00 Noon): [phone] After hours: [phone] ____________________________________________________________________________________________   Radiofrequency Ablation Radiofrequency ablation is a procedure that is performed to relieve pain. The procedure is often used for back, neck, or arm pain. Radiofrequency ablation involves the use of a machine that creates radio waves to make heat. During the procedure, the heat is applied to the nerve that carries the pain signal. The heat damages the nerve and interferes with the pain signal. Pain relief usually starts about 2 weeks after the procedure and lasts for 6 months to 1 year. Tell a health care provider about: Any allergies you have. All medicines you are taking, including vitamins, herbs, eye drops, creams, and over-the-counter medicines. Any problems you or family members have had with anesthetic medicines. Any bleeding problems you have. Any surgeries you have had. Any medical conditions you have. Whether you are pregnant or may be pregnant. What are the risks? Generally, this is a safe procedure. However, problems may occur, including: Pain or soreness at the injection site. Allergic reaction to medicines given during the procedure. Bleeding. Infection at the injection site. Damage to nerves or blood vessels. What happens before the procedure? When to stop eating and drinking Follow instructions from your health care provider about  what you may eat and drink before your procedure. These may include: 8 hours before the procedure Stop eating most foods. Do not eat meat, fried foods, or fatty foods. Eat only light foods, such as toast or crackers. All liquids are okay except energy drinks and alcohol. 6 hours before the procedure Stop eating. Drink only clear liquids, such as water, clear fruit juice, black coffee, plain tea, and sports drinks. Do not drink energy drinks or alcohol. 2 hours before the procedure Stop drinking all liquids. You may be allowed to take medicine with small sips of water. If you do not follow your health care provider's instructions, your procedure may be delayed or canceled. Medicines Ask your health care provider about: Changing or stopping your regular medicines. This is especially important if you are taking diabetes medicines or blood thinners. Taking medicines such as aspirin and ibuprofen. These medicines can thin your blood. Do not take these medicines unless your health care provider tells you to take them. Taking over-the-counter medicines, vitamins, herbs, and supplements. General instructions Ask your health care provider what steps will be taken to help prevent infection. These steps may include: Removing hair at the procedure site. Washing skin with a germ-killing soap. Taking antibiotic medicine. If you will be going home right after the procedure, plan to have a responsible adult: Take you home from the hospital or clinic. You will not be allowed to drive. Care for you for the time you are told. What happens during the procedure?  You will be awake during the procedure. You will need to be able to talk with the health care provider  during the procedure. An IV will be inserted into one of your veins. You will be given one or more of the following: A medicine to help you relax (sedative). A medicine to numb the area (local anesthetic). Your health care provider will insert  a radiofrequency needle into the area to be treated. This is done with the help of fluoroscopy. A wire that carries the radio waves (electrode) will be put through the radiofrequency needle. An electrical pulse will be sent through the electrode to verify the correct nerve that is causing your pain. You will feel a tingling sensation, and you may have muscle twitching. The tissue around the needle tip will be heated by an electric current that comes from the radiofrequency machine. This will numb the nerves. The needle will be removed. A bandage (dressing) will be put on the insertion area. The procedure may vary among health care providers and hospitals. What happens after the procedure? Your blood pressure, heart rate, breathing rate, and blood oxygen level will be monitored until you leave the hospital or clinic. Return to your normal activities as told by your health care provider. Ask your health care provider what activities are safe for you. If you were given a sedative during the procedure, it can affect you for several hours. Do not drive or operate machinery until your health care provider says that it is safe. Summary Radiofrequency ablation is a procedure that is performed to relieve pain. The procedure is often used for back, neck, or arm pain. Radiofrequency ablation involves the use of a machine that creates radio waves to make heat. Plan to have a responsible adult take you home from the hospital or clinic. Do not drive or operate machinery until your health care provider says that it is safe. Return to your normal activities as told by your health care provider. Ask your health care provider what activities are safe for you. This information is not intended to replace advice given to you by your health care provider. Make sure you discuss any questions you have with your health care provider. Document Revised: 05/30/2021 Document Reviewed: 05/30/2021 Elsevier Patient Education   2024 [employer].

## 2023-05-27 NOTE — Progress Notes (Signed)
PROVIDER NOTE: Interpretation of information contained herein should be left to medically-trained personnel. Specific patient instructions are provided elsewhere under "Patient Instructions" section of medical record. This document was created in part using STT-dictation technology, any transcriptional errors that may result from this process are unintentional.  Patient: Valerie Welch Type: Established DOB: Sep 11, 1971 MRN: [id number] PCP: Barbette Reichmann, MD  Service: Procedure DOS: 05/27/2023 Setting: Ambulatory Location: Ambulatory outpatient facility Delivery: Face-to-face Provider: Edward Jolly, MD Specialty: Interventional Pain Management Specialty designation: 09 Location: Outpatient facility Ref. Prov.: Edward Jolly, MD       Interventional Therapy   Procedure: Lumbar Facet, Medial Branch Radiofrequency Ablation (RFA) #1  Laterality: Right (-RT)  Level: L3, L4, and L5 Medial Branch Level(s). These levels will denervate the L1-2, L2-3, L3-4, L4-5, and L5-S1 lumbar facet joints.  Imaging: Fluoroscopy-guided         Anesthesia: Local anesthesia (1-2% Lidocaine) Anxiolysis: IV Versed 2.0 mg DOS: 05/27/2023  Performed by: Edward Jolly, MD  Purpose: Therapeutic/Palliative Indications: Low back pain severe enough to impact quality of life or function. Indications: 1. Lumbar facet arthropathy   2. Chronic pain syndrome   3. Lumbar spondylosis    Valerie Welch has been dealing with the above chronic pain for longer than three months and has either failed to respond, was unable to tolerate, or simply did not get enough benefit from other more conservative therapies including, but not limited to: 1. Over-the-counter medications 2. Anti-inflammatory medications 3. Muscle relaxants 4. Membrane stabilizers 5. Opioids 6. Physical therapy and/or chiropractic manipulation 7. Modalities (Heat, ice, etc.) 8. Invasive techniques such as nerve blocks. Valerie Welch has  attained more than 50% relief of the pain from a series of diagnostic injections conducted in separate occasions.  Pain Score: Pre-procedure: 5 /10 Post-procedure: 5 /10     Position / Prep / Materials:  Position: Prone  Prep solution: DuraPrep (Iodine Povacrylex [0.7% available iodine] and Isopropyl Alcohol, 74% w/w) Prep Area: Entire Lumbosacral Region (Lower back from mid-thoracic region to end of tailbone and from flank to flank.) Materials:  Tray: RFA (Radiofrequency) tray Needle(s):  Type: RFA (Teflon-coated radiofrequency ablation needles) Gauge (G): 22  Length: Regular (10cm) Qty: 3      Pre-op H&P Assessment:  Valerie Welch is a 52 y.o. (year old), female patient, seen today for interventional treatment. She  has a past surgical history that includes Cesarean section; Cholecystectomy, laparoscopic; Ankle arthroscopy (Right, 09/26/2018); Tenotomy / flexor tendon transfer (Right, 09/26/2018); Arthrodesis metatarsalphalangeal joint (mtpj) (Right, 09/26/2018); Exostectectomy toe (Right, 09/26/2018); Cholecystectomy; Colonoscopy with propofol (N/A, 06/16/2020); Esophagogastroduodenoscopy (egd) with propofol (N/A, 06/16/2020); Robotic assisted laparoscopic hysterectomy and salpingectomy (N/A, 07/05/2020); Colonoscopy with propofol (N/A, 02/15/2023); and Esophagogastroduodenoscopy (egd) with propofol (N/A, 02/15/2023). Valerie Welch has a current medication list which includes the following prescription(s): pantoprazole, prednisone, albuterol, alprazolam, amlodipine, ashwagandha, azelastine, bepreve, bupropion, cetirizine, b-12, diclofenac sodium, dss, duloxetine, fluconazole, fluticasone-salmeterol, gabapentin, hydrocodone-acetaminophen, iron polysaccharides, levothyroxine, montelukast, multivitamin with minerals, NON FORMULARY, omeprazole, polyethylene glycol, promethazine, rosuvastatin, and trolamine salicylate, and the following Facility-Administered Medications: iohexol. Her primarily  concern today is the Back Pain (lower)  Initial Vital Signs:  Pulse/HCG Rate: 75ECG Heart Rate: 73 Temp: 97.9 F (36.6 C) Resp: 18 BP: (!) 142/95 SpO2: 100 %  BMI: Estimated body mass index is 34.01 kg/m as calculated from the following:   Height as of this encounter: 5\' 1"  (1.549 m).   Weight as of this encounter: 180 lb (81.6 kg).  Risk Assessment: Allergies: Reviewed. She is allergic to beef-derived  products, other, percocet [oxycodone-acetaminophen], and pitocin [oxytocin].  Allergy Precautions: None required Coagulopathies: Reviewed. None identified.  Blood-thinner therapy: None at this time Active Infection(s): Reviewed. None identified. Valerie Welch is afebrile  Site Confirmation: Valerie Welch was asked to confirm the procedure and laterality before marking the site Procedure checklist: Completed Consent: Before the procedure and under the influence of no sedative(s), amnesic(s), or anxiolytics, the patient was informed of the treatment options, risks and possible complications. To fulfill our ethical and legal obligations, as recommended by the American Medical Association's Code of Ethics, I have informed the patient of my clinical impression; the nature and purpose of the treatment or procedure; the risks, benefits, and possible complications of the intervention; the alternatives, including doing nothing; the risk(s) and benefit(s) of the alternative treatment(s) or procedure(s); and the risk(s) and benefit(s) of doing nothing. The patient was provided information about the general risks and possible complications associated with the procedure. These may include, but are not limited to: failure to achieve desired goals, infection, bleeding, organ or nerve damage, allergic reactions, paralysis, and death. In addition, the patient was informed of those risks and complications associated to Spine-related procedures, such as failure to decrease pain; infection (i.e.:  Meningitis, epidural or intraspinal abscess); bleeding (i.e.: epidural hematoma, subarachnoid hemorrhage, or any other type of intraspinal or peri-dural bleeding); organ or nerve damage (i.e.: Any type of peripheral nerve, nerve root, or spinal cord injury) with subsequent damage to sensory, motor, and/or autonomic systems, resulting in permanent pain, numbness, and/or weakness of one or several areas of the body; allergic reactions; (i.e.: anaphylactic reaction); and/or death. Furthermore, the patient was informed of those risks and complications associated with the medications. These include, but are not limited to: allergic reactions (i.e.: anaphylactic or anaphylactoid reaction(s)); adrenal axis suppression; blood sugar elevation that in diabetics may result in ketoacidosis or comma; water retention that in patients with history of congestive heart failure may result in shortness of breath, pulmonary edema, and decompensation with resultant heart failure; weight gain; swelling or edema; medication-induced neural toxicity; particulate matter embolism and blood vessel occlusion with resultant organ, and/or nervous system infarction; and/or aseptic necrosis of one or more joints. Finally, the patient was informed that Medicine is not an exact science; therefore, there is also the possibility of unforeseen or unpredictable risks and/or possible complications that may result in a catastrophic outcome. The patient indicated having understood very clearly. We have given the patient no guarantees and we have made no promises. Enough time was given to the patient to ask questions, all of which were answered to the patient's satisfaction. Ms. Pignone has indicated that she wanted to continue with the procedure. Attestation: I, the ordering provider, attest that I have discussed with the patient the benefits, risks, side-effects, alternatives, likelihood of achieving goals, and potential problems during recovery  for the procedure that I have provided informed consent. Date  Time: 05/27/2023 10:01 AM   Pre-Procedure Preparation:  Monitoring: As per clinic protocol. Respiration, ETCO2, SpO2, BP, heart rate and rhythm monitor placed and checked for adequate function Safety Precautions: Patient was assessed for positional comfort and pressure points before starting the procedure. Time-out: I initiated and conducted the "Time-out" before starting the procedure, as per protocol. The patient was asked to participate by confirming the accuracy of the "Time Out" information. Verification of the correct person, site, and procedure were performed and confirmed by me, the nursing staff, and the patient. "Time-out" conducted as per Joint Commission's Universal Protocol (UP.01.01.01). Time:  1054 Start Time: 1054 hrs.  Description of Procedure:          Laterality: See above. Levels:  See above. Safety Precautions: Aspiration looking for blood return was conducted prior to all injections. At no point did we inject any substances, as a needle was being advanced. Before injecting, the patient was told to immediately notify me if she was experiencing any new onset of "ringing in the ears, or metallic taste in the mouth". No attempts were made at seeking any paresthesias. Safe injection practices and needle disposal techniques used. Medications properly checked for expiration dates. SDV (single dose vial) medications used. After the completion of the procedure, all disposable equipment used was discarded in the proper designated medical waste containers. Local Anesthesia: Protocol guidelines were followed. The patient was positioned over the fluoroscopy table. The area was prepped in the usual manner. The time-out was completed. The target area was identified using fluoroscopy. A 12-in long, straight, sterile hemostat was used with fluoroscopic guidance to locate the targets for each level blocked. Once located, the skin was  marked with an approved surgical skin marker. Once all sites were marked, the skin (epidermis, dermis, and hypodermis), as well as deeper tissues (fat, connective tissue and muscle) were infiltrated with a small amount of a short-acting local anesthetic, loaded on a 10cc syringe with a 25G, 1.5-in  Needle. An appropriate amount of time was allowed for local anesthetics to take effect before proceeding to the next step. Technical description of process:   Radiofrequency Ablation (RFA) L3 Medial Branch Nerve RFA: The target area for the L3 medial branch is at the junction of the postero-lateral aspect of the superior articular process and the superior, posterior, and medial edge of the transverse process of L4. Under fluoroscopic guidance, a Radiofrequency needle was inserted until contact was made with os over the superior postero-lateral aspect of the pedicular shadow (target area). Sensory and motor testing was conducted to properly adjust the position of the needle. Once satisfactory placement of the needle was achieved, the numbing solution was slowly injected after negative aspiration for blood. 2.0 mL of the nerve block solution was injected without difficulty or complication. After waiting for at least 3 minutes, the ablation was performed. Once completed, the needle was removed intact. L4 Medial Branch Nerve RFA: The target area for the L4 medial branch is at the junction of the postero-lateral aspect of the superior articular process and the superior, posterior, and medial edge of the transverse process of L5. Under fluoroscopic guidance, a Radiofrequency needle was inserted until contact was made with os over the superior postero-lateral aspect of the pedicular shadow (target area). Sensory and motor testing was conducted to properly adjust the position of the needle. Once satisfactory placement of the needle was achieved, the numbing solution was slowly injected after negative aspiration for blood.  2.0 mL of the nerve block solution was injected without difficulty or complication. After waiting for at least 3 minutes, the ablation was performed. Once completed, the needle was removed intact. L5 Medial Branch Nerve RFA: The target area for the L5 medial branch is at the junction of the postero-lateral aspect of the superior articular process of S1 and the superior, posterior, and medial edge of the sacral ala. Under fluoroscopic guidance, a Radiofrequency needle was inserted until contact was made with os over the superior postero-lateral aspect of the pedicular shadow (target area). Sensory and motor testing was conducted to properly adjust the position of the needle. Once satisfactory  placement of the needle was achieved, the numbing solution was slowly injected after negative aspiration for blood. 2.0 mL of the nerve block solution was injected without difficulty or complication. After waiting for at least 3 minutes, the ablation was performed. Once completed, the needle was removed intact.  Radiofrequency lesioning (ablation):  Radiofrequency Generator: Medtronic AccurianTM AG 1000 RF Generator Sensory Stimulation Parameters: 50 Hz was used to locate & identify the nerve, making sure that the needle was positioned such that there was no sensory stimulation below 0.3 V or above 0.7 V. Motor Stimulation Parameters: 2 Hz was used to evaluate the motor component. Care was taken not to lesion any nerves that demonstrated motor stimulation of the lower extremities at an output of less than 2.5 times that of the sensory threshold, or a maximum of 2.0 V. Lesioning Technique Parameters: Standard Radiofrequency settings. (Not bipolar or pulsed.) Temperature Settings: 80 degrees C Lesioning time: 60 seconds Intra-operative Compliance: Compliant  Once the entire procedure was completed, the treated area was cleaned, making sure to leave some of the prepping solution back to take advantage of its long term  bactericidal properties.    Illustration of the posterior view of the lumbar spine and the posterior neural structures. Laminae of L2 through S1 are labeled. DPRL5, dorsal primary ramus of L5; DPRS1, dorsal primary ramus of S1; DPR3, dorsal primary ramus of L3; FJ, facet (zygapophyseal) joint L3-L4; I, inferior articular process of L4; LB1, lateral branch of dorsal primary ramus of L1; IAB, inferior articular branches from L3 medial branch (supplies L4-L5 facet joint); IBP, intermediate branch plexus; MB3, medial branch of dorsal primary ramus of L3; NR3, third lumbar nerve root; S, superior articular process of L5; SAB, superior articular branches from L4 (supplies L4-5 facet joint also); TP3, transverse process of L3.  Facet Joint Innervation (* possible contribution)  L1-2 T12, L1 (L2*)  Medial Branch  L2-3 L1, L2 (L3*)         "          "  L3-4 L2, L3 (L4*)         "          "  L4-5 L3, L4 (L5*)         "          "  L5-S1 L4, L5, S1          "          "    Vitals:   05/27/23 1058 05/27/23 1103 05/27/23 1109 05/27/23 1117  BP: (!) 146/132 (!) 157/110 (!) 158/119 (!) 151/92  Pulse:      Resp: 18 16 18 16   Temp:    98.2 F (36.8 C)  TempSrc:      SpO2: 100% 100% 100%   Weight:      Height:        Start Time: 1054 hrs. End Time: 1108 hrs.  Imaging Guidance (Spinal):          Type of Imaging Technique: Fluoroscopy Guidance (Spinal) Indication(s): Assistance in needle guidance and placement for procedures requiring needle placement in or near specific anatomical locations not easily accessible without such assistance. Exposure Time: Please see nurses notes. Contrast: None used. Fluoroscopic Guidance: I was personally present during the use of fluoroscopy. "Tunnel Vision Technique" used to obtain the best possible view of the target area. Parallax error corrected before commencing the procedure. "Direction-depth-direction" technique used to introduce the needle under continuous  pulsed fluoroscopy. Once  target was reached, antero-posterior, oblique, and lateral fluoroscopic projection used confirm needle placement in all planes. Images permanently stored in EMR. Interpretation: No contrast injected. I personally interpreted the imaging intraoperatively. Adequate needle placement confirmed in multiple planes. Permanent images saved into the patient's record.  Antibiotic Prophylaxis:   Anti-infectives (From admission, onward)    None      Indication(s): None identified  Post-operative Assessment:  Post-procedure Vital Signs:  Pulse/HCG Rate: 7580 Temp: 98.2 F (36.8 C) Resp: 16 BP: (!) 151/92 SpO2: 100 %  EBL: None  Complications: No immediate post-treatment complications observed by team, or reported by patient.  Note: The patient tolerated the entire procedure well. A repeat set of vitals were taken after the procedure and the patient was kept under observation following institutional policy, for this type of procedure. Post-procedural neurological assessment was performed, showing return to baseline, prior to discharge. The patient was provided with post-procedure discharge instructions, including a section on how to identify potential problems. Should any problems arise concerning this procedure, the patient was given instructions to immediately contact us, at any time, without hesitation. In any case, we plan to contact the patient by telephone for a follow-up status report regarding this interventional procedure.  Comments:  No additional relevant information.  Plan of Care (POC)  Orders:  Orders Placed This Encounter  Procedures   DG PAIN CLIN[facility] C-ARM 1-60 MIN NO REPORT    Intraoperative interpretation by procedural physician at [facility].    Standing Status:   Standing    Number of Occurrences:   1    Order Specific Question:   Reason for exam:    Answer:   Assistance in needle guidance and placement for procedures requiring needle  placement in or near specific anatomical locations not easily accessible without such assistance.     Medications ordered for procedure: Meds ordered this encounter  Medications   iohexol (OMNIPAQUE) 180 MG/ML injection 10 mL    Must be Myelogram-compatible. If not available, you may substitute with a water-soluble, non-ionic, hypoallergenic, myelogram-compatible radiological contrast medium.   lidocaine (XYLOCAINE) 2 % (with pres) injection 400 mg   midazolam (VERSED) injection 0.5-2 mg    Make sure Flumazenil is available in the pyxis when using this medication. If oversedation occurs, administer 0.2 mg IV over 15 sec. If after 45 sec no response, administer 0.2 mg again over 1 min; may repeat at 1 min intervals; not to exceed 4 doses (1 mg)   dexamethasone (DECADRON) injection 10 mg   ropivacaine (PF) 2 mg/mL (0.2%) (NAROPIN) injection 9 mL   Medications administered: We administered lidocaine, midazolam, dexamethasone, and ropivacaine (PF) 2 mg/mL (0.2%).  See the medical record for exact dosing, route, and time of administration.  Follow-up plan:   Return for KEEP SCHEDULED APPOINTMENT ON 06/19..       Right lower extremity CRPS, lumbar sympathetic nerve block denied by insurance.  Right L4-5 ESI #2 01/01/22, #3 02/28/22, #4 11/16/22, Consider spinal cord stimulator trial in future.  Bilateral L3, L4, L5 medial branch nerve block 01/16/2023, 03/20/23. Right L3,4,5 05/27/23     Recent Visits Date Type Provider Dept  05/01/23 Office Visit Edward Jolly, MD [facility]  03/20/23 Procedure visit Edward Jolly, MD [facility]  Showing recent visits within past 90 days and meeting all other requirements Today's Visits Date Type Provider Dept  05/27/23 Procedure visit Edward Jolly, MD [facility]  Showing today's visits and meeting all other requirements Future Appointments  Date Type Provider Dept  06/12/23 Appointment Edward Jolly, MD [facility]   Showing future appointments within next 90 days and meeting all other requirements  Disposition: Discharge home  Discharge (Date  Time): 05/27/2023; 1118 hrs.   Primary Care Physician: Barbette Reichmann, MD Location: [facility] Note by: Edward Jolly, MD (TTS technology used. I apologize for any typographical errors that were not detected and corrected.) Date: 05/27/2023; Time: 12:04 PM  Disclaimer:  Medicine is not an Visual merchandiser. The only guarantee in medicine is that nothing is guaranteed. It is important to note that the decision to proceed with this intervention was based on the information collected from the patient. The Data and conclusions were drawn from the patient's questionnaire, the interview, and the physical examination. Because the information was provided in large part by the patient, it cannot be guaranteed that it has not been purposely or unconsciously manipulated. Every effort has been made to obtain as much relevant data as possible for this evaluation. It is important to note that the conclusions that lead to this procedure are derived in large part from the available data. Always take into account that the treatment will also be dependent on availability of resources and existing treatment guidelines, considered by other Pain Management Practitioners as being common knowledge and practice, at the time of the intervention. For Medico-Legal purposes, it is also important to point out that variation in procedural techniques and pharmacological choices are the acceptable norm. The indications, contraindications, technique, and results of the above procedure should only be interpreted and judged by a Board-Certified Interventional Pain Specialist with extensive familiarity and expertise in the same exact procedure and technique.

## 2023-05-28 ENCOUNTER — Telehealth: Payer: Self-pay

## 2023-05-28 NOTE — Telephone Encounter (Signed)
Post procedure follow up.  Patient states she is doing ok.  

## 2023-06-12 ENCOUNTER — Ambulatory Visit
Payer: BC Managed Care – PPO | Attending: Student in an Organized Health Care Education/Training Program | Admitting: Student in an Organized Health Care Education/Training Program

## 2023-06-12 ENCOUNTER — Ambulatory Visit
Admission: RE | Admit: 2023-06-12 | Discharge: 2023-06-12 | Disposition: A | Discharge status: Home / Self Care | Payer: BC Managed Care – PPO | Source: Ambulatory Visit | Attending: Student in an Organized Health Care Education/Training Program | Admitting: Student in an Organized Health Care Education/Training Program

## 2023-06-12 ENCOUNTER — Encounter: Payer: Self-pay | Admitting: Student in an Organized Health Care Education/Training Program

## 2023-06-12 DIAGNOSIS — G894 Chronic pain syndrome: Secondary | ICD-10-CM | POA: Diagnosis present

## 2023-06-12 DIAGNOSIS — M47816 Spondylosis without myelopathy or radiculopathy, lumbar region: Secondary | ICD-10-CM

## 2023-06-12 MED ORDER — MIDAZOLAM HCL 2 MG/2ML IJ SOLN
0.5000 mg | Freq: Once | INTRAMUSCULAR | Status: AC
  Administered 2023-06-12: 2 mg via INTRAVENOUS

## 2023-06-12 MED ORDER — PENTAFLUOROPROP-TETRAFLUOROETH EX AERO
INHALATION_SPRAY | Freq: Once | CUTANEOUS | Status: AC
  Administered 2023-06-12: 30 via TOPICAL
  Filled 2023-06-12: qty 116

## 2023-06-12 MED ORDER — ROPIVACAINE HCL 2 MG/ML IJ SOLN
9.0000 mL | Freq: Once | INTRAMUSCULAR | Status: AC
  Administered 2023-06-12: 9 mL via PERINEURAL

## 2023-06-12 MED ORDER — ROPIVACAINE HCL 2 MG/ML IJ SOLN
INTRAMUSCULAR | Status: AC
  Filled 2023-06-12: qty 20

## 2023-06-12 MED ORDER — DEXAMETHASONE SODIUM PHOSPHATE 10 MG/ML IJ SOLN
10.0000 mg | Freq: Once | INTRAMUSCULAR | Status: AC
  Administered 2023-06-12: 10 mg

## 2023-06-12 MED ORDER — MIDAZOLAM HCL 2 MG/2ML IJ SOLN
INTRAMUSCULAR | Status: AC
  Filled 2023-06-12: qty 2

## 2023-06-12 MED ORDER — DEXAMETHASONE SODIUM PHOSPHATE 10 MG/ML IJ SOLN
INTRAMUSCULAR | Status: AC
  Filled 2023-06-12: qty 1

## 2023-06-12 MED ORDER — LIDOCAINE HCL 2 % IJ SOLN
INTRAMUSCULAR | Status: AC
  Filled 2023-06-12: qty 20

## 2023-06-12 MED ORDER — LACTATED RINGERS IV SOLN: Freq: Once | INTRAVENOUS | Status: AC

## 2023-06-12 NOTE — Patient Instructions (Addendum)
Post-procedure Information What to expect: Most procedures involve the use of a local anesthetic (numbing medicine), and a steroid (anti-inflammatory medicine).  The local anesthetics may cause temporary numbness and weakness of the legs or arms, depending on the location of the block. This numbness/weakness may last 4-6 hours, depending on the local anesthetic used. In rare instances, it can last up to 24 hours. While numb, you must be very careful not to injure the extremity.  After any procedure, you could expect the pain to get better within 15-20 minutes. This relief is temporary and may last 4-6 hours. Once the local anesthetics wears off, you could experience discomfort, possibly more than usual, for up to 10 (ten) days. In the case of radiofrequencies, it may last up to 6 weeks. Surgeries may take up to 8 weeks for the healing process. The discomfort is due to the irritation caused by needles going through skin and muscle. To minimize the discomfort, we recommend using ice the first day, and heat from then on. The ice should be applied for 15 minutes on, and 15 minutes off. Keep repeating this cycle until bedtime. Avoid applying the ice directly to the skin, to prevent frostbite. Heat should be used daily, until the pain improves (4-10 days). Be careful not to burn yourself.  Occasionally you may experience muscle spasms or cramps. These occur as a consequence of the irritation caused by the needle sticks to the muscle and the blood that will inevitably be lost into the surrounding muscle tissue. Blood tends to be very irritating to tissues, which tend to react by going into spasm. These spasms may start the same day of your procedure, but they may also take days to develop. This late onset type of spasm or cramp is usually caused by electrolyte imbalances triggered by the steroids, at the level of the kidney. Cramps and spasms tend to respond well to muscle relaxants, multivitamins (some are  triggered by the procedure, but may have their origins in vitamin deficiencies), and "Gatorade", or any sports drinks that can replenish any electrolyte imbalances. (If you are a diabetic, ask your pharmacist to get you a sugar-free brand.) Warm showers or baths may also be helpful. Stretching exercises are highly recommended. General Instructions:  Be alert for signs of possible infection: redness, swelling, heat, red streaks, elevated temperature, and/or fever. These typically appear 4 to 6 days after the procedure. Immediately notify your doctor if you experience unusual bleeding, difficulty breathing, or loss of bowel or bladder control. If you experience increased pain, do not increase your pain medicine intake, unless instructed by your pain physician. Post-Procedure Care:  Be careful in moving about. Muscle spasms in the area of the injection may occur. Applying ice or heat to the area is often helpful. The incidence of spinal headaches after epidural injections ranges between 1.4% and 6%. If you develop a headache that does not seem to respond to conservative therapy, please let your physician know. This can be treated with an epidural blood patch.   Post-procedure numbness or redness is to be expected, however it should average 4 to 6 hours. If numbness and weakness of your extremities begins to develop 4 to 6 hours after your procedure, and is felt to be progressing and worsening, immediately contact your physician.   Diet:  If you experience nausea, do not eat until this sensation goes away. If you had a "Stellate Ganglion Block" for upper extremity "Reflex Sympathetic Dystrophy", do not eat or drink until your   hoarseness goes away. In any case, always start with liquids first and if you tolerate them well, then slowly progress to more solid foods. Activity:  For the first 4 to 6 hours after the procedure, use caution in moving about as you may experience numbness and/or weakness. Use caution in  cooking, using household electrical appliances, and climbing steps. If you need to reach your Doctor call our office: [phone] Monday-Thursday 8:00 am - 4:00 PM    Fridays: Closed     In case of an emergency: In case of emergency, call 911 or go to the nearest emergency room and have the physician there call us.  Interpretation of Procedure Every nerve block has two components: a diagnostic component, and a treatment component. Unrealistic expectations are the most common causes of "perceived failure".  In a perfect world, a single nerve block should be able to completely and permanently eliminate the pain. Sadly, the world is not perfect.  Most pain management nerve blocks are performed using local anesthetics and steroids. Steroids are responsible for any long-term benefit that you may experience. Their purpose is to decrease any chronic swelling that may exist in the area. Steroids begin to work immediately after being injected. However, most patients will not experience any benefits until 5 to 10 days after the injection, when the swelling has come down to the point where they can tell a difference. Steroids will only help if there is swelling to be treated. As such, they can assist with the diagnosis. If effective, they suggest an inflammatory component to the pain, and if ineffective, they rule out inflammation as the main cause or component of the problem. If the problem is one of mechanical compression, you will get no benefit from those steroids.   In the case of local anesthetics, they have a crucial role in the diagnosis of your condition. Most will begin to work within15 to 20 minutes after injection. The duration will depend on the type used (short- vs. Long-acting). It is of outmost importance that patients keep tract of their pain, after the procedure. To assist with this matter, a "Post-procedure Pain Diary" is provided. Make sure to complete it and to bring it back to your  follow-up appointment.  As long as the patient keeps accurate, detailed records of their symptoms after every procedure, and returns to have those interpreted, every procedure will provide us with invaluable information. Even a block that does not provide the patient with any relief, will always provide us with information about the mechanism and the origin of the pain. The only time a nerve block can be considered a waste of time is when patients do not keep track of the results, or do not keep their post-procedure appointment.  Reporting the results back to your physician The Pain Score  Pain is a subjective complaint. It cannot be seen, touched, or measured. We depend entirely on the patient's report of the pain in order to assess your condition and treatment. To evaluate the pain, we use a pain scale, where "0" means "No Pain", and a "10" is "the worst possible pain that you can even imagine" (i.e. something like been eaten alive by a shark or being torn apart by a lion).   You will frequently be asked to rate your pain. Please be as accurate, remember that medical decisions will be based on your responses. Please do not rate your pain above a 10. Doing so is actually interpreted as "symptom magnification" (exaggeration), as   well as lack of understanding with regards to the scale. To put this into perspective, when you tell us that your pain is at a 10 (ten), what you are saying is that there is nothing we can do to make this pain any worse. (Carefully think about that.)  ___________________________________________________________________________________________  Post-Radiofrequency (RF) Discharge Instructions  You have just completed a Radiofrequency Neurotomy.  The following instructions will provide you with information and guidelines for self-care upon discharge.  If at any time you have questions or concerns please call your physician. DO NOT DRIVE YOURSELF!!  Instructions: Apply ice: Fill  a plastic sandwich bag with crushed ice. Cover it with a small towel and apply to injection site. Apply for 15 minutes then remove x 15 minutes. Repeat sequence on day of procedure, until you go to bed. The purpose is to minimize swelling and discomfort after procedure. Apply heat: Apply heat to procedure site starting the day following the procedure. The purpose is to treat any soreness and discomfort from the procedure. Food intake: No eating limitations, unless stipulated above.  Nevertheless, if you have had sedation, you may experience some nausea.  In this case, it may be wise to wait at least two hours prior to resuming regular diet. Physical activities: Keep activities to a minimum for the first 8 hours after the procedure. For the first 24 hours after the procedure, do not drive a motor vehicle,  Operate heavy machinery, power tools, or handle any weapons.  Consider walking with the use of an assistive device or accompanied by an adult for the first 24 hours.  Do not drink alcoholic beverages including beer.  Do not make any important decisions or sign any legal documents. Go home and rest today.  Resume activities tomorrow, as tolerated.  Use caution in moving about as you may experience mild leg weakness.  Use caution in cooking, use of household electrical appliances and climbing steps. Driving: If you have received any sedation, you are not allowed to drive for 24 hours after your procedure. Blood thinner: Restart your blood thinner 6 hours after your procedure. (Only for those taking blood thinners) Insulin: As soon as you can eat, you may resume your normal dosing schedule. (Only for those taking insulin) Medications: May resume pre-procedure medications.  Do not take any drugs, other than what has been prescribed to you. Infection prevention: Keep procedure site clean and dry. Post-procedure Pain Diary: Extremely important that this be done correctly and accurately. Recorded information will  be used to determine the next step in treatment. Pain evaluated is that of treated area only. Do not include pain from an untreated area. Complete every hour, on the hour, for the initial 8 hours. Set an alarm to help you do this part accurately. Do not go to sleep and have it completed later. It will not be accurate. Follow-up appointment: Keep your follow-up appointment after the procedure. Usually 2-6 weeks after radiofrequency. Bring you pain diary. The information collected will be essential for your long-term care.   Expect: From numbing medicine (AKA: Local Anesthetics): Numbness or decrease in pain. Onset: Full effect within 15 minutes of injected. Duration: It will depend on the type of local anesthetic used. On the average, 1 to 8 hours.  From steroids (when added): Decrease in swelling or inflammation. Once inflammation is improved, relief of the pain will follow. Onset of benefits: Depends on the amount of swelling present. The more swelling, the longer it will take for the benefits to   be seen. In some cases, up to 10 days. Duration: Steroids will stay in the system x 2 weeks. Duration of benefits will depend on multiple posibilities including persistent irritating factors. From procedure: Some discomfort is to be expected once the numbing medicine wears off. In the case of radiofrequency procedures, this may last as long as 6 weeks. Additional post-procedure pain medication is provided for this. Discomfort is minimized if ice and heat are applied as instructed.  Call if: You experience numbness and weakness that gets worse with time, as opposed to wearing off. He experience any unusual bleeding, difficulty breathing, or loss of the ability to control your bowel and bladder. (This applies to Spinal procedures only) You experience any redness, swelling, heat, red streaks, elevated temperature, fever, or any other signs of a possible infection.  Emergency Numbers: Durning business  hours (Monday - Thursday, 8:00 AM - 4:00 PM) (Friday, 9:00 AM - 12:00 Noon): [phone] After hours: [phone] ____________________________________________________________________________________________   

## 2023-06-12 NOTE — Progress Notes (Signed)
Safety precautions to be maintained throughout the outpatient stay will include: orient to surroundings, keep bed in low position, maintain call bell within reach at all times, provide assistance with transfer out of bed and ambulation.  

## 2023-06-12 NOTE — Progress Notes (Signed)
PROVIDER NOTE: Interpretation of information contained herein should be left to medically-trained personnel. Specific patient instructions are provided elsewhere under "Patient Instructions" section of medical record. This document was created in part using STT-dictation technology, any transcriptional errors that may result from this process are unintentional.  Patient: Valerie Welch Type: Established DOB: 15-May-1971 MRN: [id number] PCP: Barbette Reichmann, MD  Service: Procedure DOS: 06/12/2023 Setting: Ambulatory Location: Ambulatory outpatient facility Delivery: Face-to-face Provider: Edward Jolly, MD Specialty: Interventional Pain Management Specialty designation: 09 Location: Outpatient facility Ref. Prov.: Edward Jolly, MD       Interventional Therapy   Procedure: Lumbar Facet, Medial Branch Radiofrequency Ablation (RFA) #1  Laterality: Left (-LT)  Level: L3, L4, and L5 Medial Branch Level(s). These levels will denervate the L3-4 and L4-5 lumbar facet joints.  Imaging: Fluoroscopy-guided         Anesthesia: Local anesthesia (1-2% Lidocaine) Anxiolysis: IV Versed 2.0 mg DOS: 06/12/2023  Performed by: Edward Jolly, MD  Purpose: Therapeutic/Palliative Indications: Low back pain severe enough to impact quality of life or function. Indications: 1. Lumbar facet arthropathy   2. Chronic pain syndrome   3. Lumbar spondylosis    Valerie Welch has been dealing with the above chronic pain for longer than three months and has either failed to respond, was unable to tolerate, or simply did not get enough benefit from other more conservative therapies including, but not limited to: 1. Over-the-counter medications 2. Anti-inflammatory medications 3. Muscle relaxants 4. Membrane stabilizers 5. Opioids 6. Physical therapy and/or chiropractic manipulation 7. Modalities (Heat, ice, etc.) 8. Invasive techniques such as nerve blocks. Valerie Welch has attained more than 50%  relief of the pain from a series of diagnostic injections conducted in separate occasions.  Pain Score: Pre-procedure: 4 /10 Post-procedure: 4 /10     Position / Prep / Materials:  Position: Prone  Prep solution: DuraPrep (Iodine Povacrylex [0.7% available iodine] and Isopropyl Alcohol, 74% w/w) Prep Area: Entire Lumbosacral Region (Lower back from mid-thoracic region to end of tailbone and from flank to flank.) Materials:  Tray: RFA (Radiofrequency) tray Needle(s):  Type: RFA (Teflon-coated radiofrequency ablation needles) Gauge (G): 22  Length: Regular (10cm) Qty: 3      Pre-op H&P Assessment:  Valerie Welch is a 52 y.o. (year old), female patient, seen today for interventional treatment. She  has a past surgical history that includes Cesarean section; Cholecystectomy, laparoscopic; Ankle arthroscopy (Right, 09/26/2018); Tenotomy / flexor tendon transfer (Right, 09/26/2018); Arthrodesis metatarsalphalangeal joint (mtpj) (Right, 09/26/2018); Exostectectomy toe (Right, 09/26/2018); Cholecystectomy; Colonoscopy with propofol (N/A, 06/16/2020); Esophagogastroduodenoscopy (egd) with propofol (N/A, 06/16/2020); Robotic assisted laparoscopic hysterectomy and salpingectomy (N/A, 07/05/2020); Colonoscopy with propofol (N/A, 02/15/2023); and Esophagogastroduodenoscopy (egd) with propofol (N/A, 02/15/2023). Valerie Welch has a current medication list which includes the following prescription(s): albuterol, alprazolam, amlodipine, ashwagandha, azelastine, bepreve, bisacodyl, bupropion, cetirizine, b-12, diclofenac sodium, duloxetine, fluticasone-salmeterol, gabapentin, hydrocodone-acetaminophen, iron polysaccharides, levothyroxine, montelukast, NON FORMULARY, pantoprazole, polyethylene glycol, rosuvastatin, dss, fluconazole, fluticasone-salmeterol, multivitamin with minerals, omeprazole, prednisone, promethazine, and trolamine salicylate, and the following Facility-Administered Medications: lactated  ringers. Her primarily concern today is the Back Pain  Initial Vital Signs:  Pulse/HCG Rate: 94  Temp: (!) 97.3 F (36.3 C) Resp: 17 BP: (!) 141/94 SpO2: 99 %  BMI: Estimated body mass index is 34.01 kg/m as calculated from the following:   Height as of this encounter: 5\' 1"  (1.549 m).   Weight as of this encounter: 180 lb (81.6 kg).  Risk Assessment: Allergies: Reviewed. She is allergic to beef-derived products, other,  percocet [oxycodone-acetaminophen], and pitocin [oxytocin].  Allergy Precautions: None required Coagulopathies: Reviewed. None identified.  Blood-thinner therapy: None at this time Active Infection(s): Reviewed. None identified. Valerie Welch is afebrile  Site Confirmation: Valerie Welch was asked to confirm the procedure and laterality before marking the site Procedure checklist: Completed Consent: Before the procedure and under the influence of no sedative(s), amnesic(s), or anxiolytics, the patient was informed of the treatment options, risks and possible complications. To fulfill our ethical and legal obligations, as recommended by the American Medical Association's Code of Ethics, I have informed the patient of my clinical impression; the nature and purpose of the treatment or procedure; the risks, benefits, and possible complications of the intervention; the alternatives, including doing nothing; the risk(s) and benefit(s) of the alternative treatment(s) or procedure(s); and the risk(s) and benefit(s) of doing nothing. The patient was provided information about the general risks and possible complications associated with the procedure. These may include, but are not limited to: failure to achieve desired goals, infection, bleeding, organ or nerve damage, allergic reactions, paralysis, and death. In addition, the patient was informed of those risks and complications associated to Spine-related procedures, such as failure to decrease pain; infection (i.e.:  Meningitis, epidural or intraspinal abscess); bleeding (i.e.: epidural hematoma, subarachnoid hemorrhage, or any other type of intraspinal or peri-dural bleeding); organ or nerve damage (i.e.: Any type of peripheral nerve, nerve root, or spinal cord injury) with subsequent damage to sensory, motor, and/or autonomic systems, resulting in permanent pain, numbness, and/or weakness of one or several areas of the body; allergic reactions; (i.e.: anaphylactic reaction); and/or death. Furthermore, the patient was informed of those risks and complications associated with the medications. These include, but are not limited to: allergic reactions (i.e.: anaphylactic or anaphylactoid reaction(s)); adrenal axis suppression; blood sugar elevation that in diabetics may result in ketoacidosis or comma; water retention that in patients with history of congestive heart failure may result in shortness of breath, pulmonary edema, and decompensation with resultant heart failure; weight gain; swelling or edema; medication-induced neural toxicity; particulate matter embolism and blood vessel occlusion with resultant organ, and/or nervous system infarction; and/or aseptic necrosis of one or more joints. Finally, the patient was informed that Medicine is not an exact science; therefore, there is also the possibility of unforeseen or unpredictable risks and/or possible complications that may result in a catastrophic outcome. The patient indicated having understood very clearly. We have given the patient no guarantees and we have made no promises. Enough time was given to the patient to ask questions, all of which were answered to the patient's satisfaction. Ms. Salih has indicated that she wanted to continue with the procedure. Attestation: I, the ordering provider, attest that I have discussed with the patient the benefits, risks, side-effects, alternatives, likelihood of achieving goals, and potential problems during recovery  for the procedure that I have provided informed consent. Date  Time: 06/12/2023 10:01 AM   Pre-Procedure Preparation:  Monitoring: As per clinic protocol. Respiration, ETCO2, SpO2, BP, heart rate and rhythm monitor placed and checked for adequate function Safety Precautions: Patient was assessed for positional comfort and pressure points before starting the procedure. Time-out: I initiated and conducted the "Time-out" before starting the procedure, as per protocol. The patient was asked to participate by confirming the accuracy of the "Time Out" information. Verification of the correct person, site, and procedure were performed and confirmed by me, the nursing staff, and the patient. "Time-out" conducted as per Joint Commission's Universal Protocol (UP.01.01.01). Time: 1034 Start  Time: 1034 hrs.  Description of Procedure:          Laterality: See above. Levels:  See above. Safety Precautions: Aspiration looking for blood return was conducted prior to all injections. At no point did we inject any substances, as a needle was being advanced. Before injecting, the patient was told to immediately notify me if she was experiencing any new onset of "ringing in the ears, or metallic taste in the mouth". No attempts were made at seeking any paresthesias. Safe injection practices and needle disposal techniques used. Medications properly checked for expiration dates. SDV (single dose vial) medications used. After the completion of the procedure, all disposable equipment used was discarded in the proper designated medical waste containers. Local Anesthesia: Protocol guidelines were followed. The patient was positioned over the fluoroscopy table. The area was prepped in the usual manner. The time-out was completed. The target area was identified using fluoroscopy. A 12-in long, straight, sterile hemostat was used with fluoroscopic guidance to locate the targets for each level blocked. Once located, the skin was  marked with an approved surgical skin marker. Once all sites were marked, the skin (epidermis, dermis, and hypodermis), as well as deeper tissues (fat, connective tissue and muscle) were infiltrated with a small amount of a short-acting local anesthetic, loaded on a 10cc syringe with a 25G, 1.5-in  Needle. An appropriate amount of time was allowed for local anesthetics to take effect before proceeding to the next step. Technical description of process:   Radiofrequency Ablation (RFA) L3 Medial Branch Nerve RFA: The target area for the L3 medial branch is at the junction of the postero-lateral aspect of the superior articular process and the superior, posterior, and medial edge of the transverse process of L4. Under fluoroscopic guidance, a Radiofrequency needle was inserted until contact was made with os over the superior postero-lateral aspect of the pedicular shadow (target area). Sensory and motor testing was conducted to properly adjust the position of the needle. Once satisfactory placement of the needle was achieved, the numbing solution was slowly injected after negative aspiration for blood. 2.0 mL of the nerve block solution was injected without difficulty or complication. After waiting for at least 3 minutes, the ablation was performed. Once completed, the needle was removed intact. L4 Medial Branch Nerve RFA: The target area for the L4 medial branch is at the junction of the postero-lateral aspect of the superior articular process and the superior, posterior, and medial edge of the transverse process of L5. Under fluoroscopic guidance, a Radiofrequency needle was inserted until contact was made with os over the superior postero-lateral aspect of the pedicular shadow (target area). Sensory and motor testing was conducted to properly adjust the position of the needle. Once satisfactory placement of the needle was achieved, the numbing solution was slowly injected after negative aspiration for blood.  2.0 mL of the nerve block solution was injected without difficulty or complication. After waiting for at least 3 minutes, the ablation was performed. Once completed, the needle was removed intact. L5 Medial Branch Nerve RFA: The target area for the L5 medial branch is at the junction of the postero-lateral aspect of the superior articular process of S1 and the superior, posterior, and medial edge of the sacral ala. Under fluoroscopic guidance, a Radiofrequency needle was inserted until contact was made with os over the superior postero-lateral aspect of the pedicular shadow (target area). Sensory and motor testing was conducted to properly adjust the position of the needle. Once satisfactory placement of  the needle was achieved, the numbing solution was slowly injected after negative aspiration for blood. 2.0 mL of the nerve block solution was injected without difficulty or complication. After waiting for at least 3 minutes, the ablation was performed. Once completed, the needle was removed intact.  Radiofrequency lesioning (ablation):  Radiofrequency Generator: Medtronic AccurianTM AG 1000 RF Generator Sensory Stimulation Parameters: 50 Hz was used to locate & identify the nerve, making sure that the needle was positioned such that there was no sensory stimulation below 0.3 V or above 0.7 V. Motor Stimulation Parameters: 2 Hz was used to evaluate the motor component. Care was taken not to lesion any nerves that demonstrated motor stimulation of the lower extremities at an output of less than 2.5 times that of the sensory threshold, or a maximum of 2.0 V. Lesioning Technique Parameters: Standard Radiofrequency settings. (Not bipolar or pulsed.) Temperature Settings: 80 degrees C Lesioning time: 60 seconds Intra-operative Compliance: Compliant  Once the entire procedure was completed, the treated area was cleaned, making sure to leave some of the prepping solution back to take advantage of its long term  bactericidal properties.    Illustration of the posterior view of the lumbar spine and the posterior neural structures. Laminae of L2 through S1 are labeled. DPRL5, dorsal primary ramus of L5; DPRS1, dorsal primary ramus of S1; DPR3, dorsal primary ramus of L3; FJ, facet (zygapophyseal) joint L3-L4; I, inferior articular process of L4; LB1, lateral branch of dorsal primary ramus of L1; IAB, inferior articular branches from L3 medial branch (supplies L4-L5 facet joint); IBP, intermediate branch plexus; MB3, medial branch of dorsal primary ramus of L3; NR3, third lumbar nerve root; S, superior articular process of L5; SAB, superior articular branches from L4 (supplies L4-5 facet joint also); TP3, transverse process of L3.  Facet Joint Innervation (* possible contribution)  L1-2 T12, L1 (L2*)  Medial Branch  L2-3 L1, L2 (L3*)         "          "  L3-4 L2, L3 (L4*)         "          "  L4-5 L3, L4 (L5*)         "          "  L5-S1 L4, L5, S1          "          "    Vitals:   06/12/23 1040 06/12/23 1045 06/12/23 1048 06/12/23 1056  BP: (!) 133/103 (!) 141/108 (!) 143/103 (!) 134/91  Pulse: 86 91 93 92  Resp: 16 12 14 16   Temp:      SpO2: 100% 100% 100% 100%  Weight:      Height:        Start Time: 1034 hrs. End Time: 1048 hrs.  Imaging Guidance (Spinal):          Type of Imaging Technique: Fluoroscopy Guidance (Spinal) Indication(s): Assistance in needle guidance and placement for procedures requiring needle placement in or near specific anatomical locations not easily accessible without such assistance. Exposure Time: Please see nurses notes. Contrast: None used. Fluoroscopic Guidance: I was personally present during the use of fluoroscopy. "Tunnel Vision Technique" used to obtain the best possible view of the target area. Parallax error corrected before commencing the procedure. "Direction-depth-direction" technique used to introduce the needle under continuous pulsed fluoroscopy. Once  target was reached, antero-posterior, oblique, and lateral fluoroscopic projection used confirm  needle placement in all planes. Images permanently stored in EMR. Interpretation: No contrast injected. I personally interpreted the imaging intraoperatively. Adequate needle placement confirmed in multiple planes. Permanent images saved into the patient's record.  Antibiotic Prophylaxis:   Anti-infectives (From admission, onward)    None      Indication(s): None identified  Post-operative Assessment:  Post-procedure Vital Signs:  Pulse/HCG Rate: 92  Temp: (!) 97.3 F (36.3 C) Resp: 16 BP: (!) 134/91 SpO2: 100 %  EBL: None  Complications: No immediate post-treatment complications observed by team, or reported by patient.  Note: The patient tolerated the entire procedure well. A repeat set of vitals were taken after the procedure and the patient was kept under observation following institutional policy, for this type of procedure. Post-procedural neurological assessment was performed, showing return to baseline, prior to discharge. The patient was provided with post-procedure discharge instructions, including a section on how to identify potential problems. Should any problems arise concerning this procedure, the patient was given instructions to immediately contact us, at any time, without hesitation. In any case, we plan to contact the patient by telephone for a follow-up status report regarding this interventional procedure.  Comments:  No additional relevant information.  Plan of Care (POC)  Orders:  Orders Placed This Encounter  Procedures   DG PAIN CLINIC C-ARM 1-60 MIN NO REPORT    Intraoperative interpretation by procedural physician at [facility].    Standing Status:   Standing    Number of Occurrences:   1    Order Specific Question:   Reason for exam:    Answer:   Assistance in needle guidance and placement for procedures requiring needle placement in or near  specific anatomical locations not easily accessible without such assistance.     Medications ordered for procedure: Meds ordered this encounter  Medications   pentafluoroprop-tetrafluoroeth (GEBAUERS) aerosol   lactated ringers infusion   midazolam (VERSED) injection 0.5-2 mg    Make sure Flumazenil is available in the pyxis when using this medication. If oversedation occurs, administer 0.2 mg IV over 15 sec. If after 45 sec no response, administer 0.2 mg again over 1 min; may repeat at 1 min intervals; not to exceed 4 doses (1 mg)   dexamethasone (DECADRON) injection 10 mg   ropivacaine (PF) 2 mg/mL (0.2%) (NAROPIN) injection 9 mL   Medications administered: We administered pentafluoroprop-tetrafluoroeth, lactated ringers, midazolam, dexamethasone, and ropivacaine (PF) 2 mg/mL (0.2%).  See the medical record for exact dosing, route, and time of administration.  Follow-up plan:   Return in about 6 weeks (around 07/24/2023), or PPE F2F.       Right lower extremity CRPS, lumbar sympathetic nerve block denied by insurance.  Right L4-5 ESI #2 01/01/22, #3 02/28/22, #4 11/16/22, Consider spinal cord stimulator trial in future.  Bilateral L3, L4, L5 medial branch nerve block 01/16/2023, 03/20/23. Right L3,4,5 05/27/23, Left L3,4,5 RFA 06/12/23     Recent Visits Date Type Provider Dept  05/27/23 Procedure visit Edward Jolly, MD [facility]  05/01/23 Office Visit Edward Jolly, MD [facility]  03/20/23 Procedure visit Edward Jolly, MD [facility]  Showing recent visits within past 90 days and meeting all other requirements Today's Visits Date Type Provider Dept  06/12/23 Procedure visit Edward Jolly, MD [facility]  Showing today's visits and meeting all other requirements Future Appointments Date Type Provider Dept  07/17/23 Appointment Edward Jolly, MD [facility]  Showing future appointments within next 90 days  and meeting all other  requirements  Disposition: Discharge home  Discharge (Date  Time): 06/12/2023; 1057 hrs.   Primary Care Physician: Barbette Reichmann, MD Location: [facility] Note by: Edward Jolly, MD (TTS technology used. I apologize for any typographical errors that were not detected and corrected.) Date: 06/12/2023; Time: 11:05 AM  Disclaimer:  Medicine is not an Visual merchandiser. The only guarantee in medicine is that nothing is guaranteed. It is important to note that the decision to proceed with this intervention was based on the information collected from the patient. The Data and conclusions were drawn from the patient's questionnaire, the interview, and the physical examination. Because the information was provided in large part by the patient, it cannot be guaranteed that it has not been purposely or unconsciously manipulated. Every effort has been made to obtain as much relevant data as possible for this evaluation. It is important to note that the conclusions that lead to this procedure are derived in large part from the available data. Always take into account that the treatment will also be dependent on availability of resources and existing treatment guidelines, considered by other Pain Management Practitioners as being common knowledge and practice, at the time of the intervention. For Medico-Legal purposes, it is also important to point out that variation in procedural techniques and pharmacological choices are the acceptable norm. The indications, contraindications, technique, and results of the above procedure should only be interpreted and judged by a Board-Certified Interventional Pain Specialist with extensive familiarity and expertise in the same exact procedure and technique.

## 2023-06-13 ENCOUNTER — Telehealth: Payer: Self-pay

## 2023-06-13 NOTE — Telephone Encounter (Signed)
Post procedure follow up.  Patient states she is doing good.  

## 2023-07-02 ENCOUNTER — Ambulatory Visit: Payer: BC Managed Care – PPO | Admitting: Obstetrics & Gynecology

## 2023-07-09 ENCOUNTER — Telehealth: Payer: Self-pay

## 2023-07-09 ENCOUNTER — Ambulatory Visit: Payer: BC Managed Care – PPO | Admitting: Obstetrics & Gynecology

## 2023-07-09 ENCOUNTER — Other Ambulatory Visit (HOSPITAL_COMMUNITY)
Admission: RE | Admit: 2023-07-09 | Discharge: 2023-07-09 | Disposition: A | Discharge status: Home / Self Care | Payer: BC Managed Care – PPO | Source: Ambulatory Visit | Attending: Obstetrics & Gynecology | Admitting: Obstetrics & Gynecology

## 2023-07-09 ENCOUNTER — Encounter: Payer: Self-pay | Admitting: Obstetrics & Gynecology

## 2023-07-09 VITALS — BP 128/80 | HR 80 | Resp 16 | Ht 60.25 in | Wt 180.0 lb

## 2023-07-09 DIAGNOSIS — Z113 Encounter for screening for infections with a predominantly sexual mode of transmission: Secondary | ICD-10-CM | POA: Diagnosis present

## 2023-07-09 DIAGNOSIS — Z01419 Encounter for gynecological examination (general) (routine) without abnormal findings: Secondary | ICD-10-CM

## 2023-07-09 DIAGNOSIS — N760 Acute vaginitis: Secondary | ICD-10-CM

## 2023-07-09 DIAGNOSIS — Z1272 Encounter for screening for malignant neoplasm of vagina: Secondary | ICD-10-CM | POA: Insufficient documentation

## 2023-07-09 DIAGNOSIS — Z90710 Acquired absence of both cervix and uterus: Secondary | ICD-10-CM | POA: Diagnosis not present

## 2023-07-09 DIAGNOSIS — N898 Other specified noninflammatory disorders of vagina: Secondary | ICD-10-CM

## 2023-07-09 LAB — WET PREP FOR TRICH, YEAST, CLUE

## 2023-07-09 MED ORDER — TINIDAZOLE 500 MG PO TABS
ORAL_TABLET | ORAL | 0 refills | Status: AC
Start: 2023-07-09 — End: ?

## 2023-07-09 NOTE — Telephone Encounter (Signed)
Pt LVM in triage line stating that rx that was supposed to be sent after appt today was not sent to [facility].  Per notes by ML from visit today (07/09/2023), wet prep confirmed bacterial vaginosis, tinidazole usage reviewed. However, rx was not sent per EMR.   Will send rx to preferred pharmacy and route to provider for final review and close. Pt notified and apologies were sent.

## 2023-07-09 NOTE — Progress Notes (Signed)
Valerie Welch Nov 04, 1971 [id number]   History:    52 y.o. [id number] Separated   RP:  Established patient presenting for annual gyn exam    HPI:  S/P XI Robotic TLH/Bilateral Salpingectomy 07/05/2020. No pelvic pain. Pap Neg 02/2020.  Pap/HPV HR/Gono-Chlam today with full STI screen.  Had sex with her husband who is having extramarital sex. Urine/BMs wnl.  Breasts normal.  Screening mammo neg 02/2023. BMI stable 34.86.  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 Colono 01/2023.  Health labs with Fam MD.   Past medical history,surgical history, family history and social history were all reviewed and documented in the EPIC chart.  Gynecologic History Patient's last menstrual period was 09/10/2019.  Obstetric History OB History  Gravida Para Term Preterm AB Living  4 2 2   2 2   SAB IAB Ectopic Multiple Live Births  1 1          # Outcome Date GA Lbr Len/2nd Weight Sex Type Anes PTL Lv  4 IAB           3 SAB           2 Term           1 Term              ROS: A ROS was performed and pertinent positives and negatives are included in the history. GENERAL: No fevers or chills. HEENT: No change in vision, no earache, sore throat or sinus congestion. NECK: No pain or stiffness. CARDIOVASCULAR: No chest pain or pressure. No palpitations. PULMONARY: No shortness of breath, cough or wheeze. GASTROINTESTINAL: No abdominal pain, nausea, vomiting or diarrhea, melena or bright red blood per rectum. GENITOURINARY: No urinary frequency, urgency, hesitancy or dysuria. MUSCULOSKELETAL: No joint or muscle pain, no back pain, no recent trauma. DERMATOLOGIC: No rash, no itching, no lesions. ENDOCRINE: No polyuria, polydipsia, no heat or cold intolerance. No recent change in weight. HEMATOLOGICAL: No anemia or easy bruising or bleeding. NEUROLOGIC: No headache, seizures, numbness, tingling or weakness. PSYCHIATRIC: No depression, no loss of interest in normal activity or change  in sleep pattern.     Exam:   BP 128/80   Pulse 80   Resp 16   Ht 5' 0.25" (1.53 m)   Wt 180 lb (81.6 kg)   LMP 09/10/2019 Comment: sexually active  BMI 34.86 kg/m   Body mass index is 34.86 kg/m.  General appearance : Well developed well nourished female. No acute distress HEENT: Eyes: no retinal hemorrhage or exudates,  Neck supple, trachea midline, no carotid bruits, no thyroidmegaly Lungs: Clear to auscultation, no rhonchi or wheezes, or rib retractions  Heart: Regular rate and rhythm, no murmurs or gallops Breast:Examined in sitting and supine position were symmetrical in appearance, no palpable masses or tenderness,  no skin retraction, no nipple inversion, no nipple discharge, no skin discoloration, no axillary or supraclavicular lymphadenopathy Abdomen: no palpable masses or tenderness, no rebound or guarding Extremities: no edema or skin discoloration or tenderness  Pelvic: Vulva: Normal             Vagina: No gross lesions or discharge  Cervix/Uterus absent.  Adnexa  Without masses or tenderness  Anus: Normal   Assessment/Plan:  52 y.o. female for annual exam   1. Encounter for Papanicolaou smear of vagina as part of routine gynecological examination S/P XI Robotic TLH/Bilateral Salpingectomy 07/05/2020. No pelvic pain. Pap Neg 02/2020.  Pap/HPV HR/Gono-Chlam today with full STI screen.  Had sex with her husband who is having extramarital sex. Urine/BMs wnl.  Breasts normal.  Screening mammo neg 02/2023. BMI stable 34.86.  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 Colono 01/2023.  Health labs with Fam MD. - Cytology - PAP( [place])  2. S/P total hysterectomy  3. Screen for STD (sexually transmitted disease) - HIV antibody (with reflex) - RPR - Hepatitis B Surface AntiGEN - Hepatitis C Antibody - Cytology - PAP( [place]) with HPV HR, Gono-Chlam  4. Vaginal discharge Bacterial Vaginosis confirmed by Wet Prep.  Tinidazole usage  reviewed.  Prescription sent to pharmacy. - WET PREP FOR TRICH, YEAST, CLUE   Genia Del MD, 1:49 PM

## 2023-07-10 ENCOUNTER — Encounter: Payer: Self-pay | Admitting: Obstetrics & Gynecology

## 2023-07-10 LAB — RPR: RPR Ser Ql: NONREACTIVE

## 2023-07-10 LAB — HIV ANTIBODY (ROUTINE TESTING W REFLEX): HIV 1&2 Ab, 4th Generation: NONREACTIVE

## 2023-07-10 LAB — HEPATITIS B SURFACE ANTIGEN: Hepatitis B Surface Ag: NONREACTIVE

## 2023-07-10 LAB — HEPATITIS C ANTIBODY: Hepatitis C Ab: NONREACTIVE

## 2023-07-11 LAB — CYTOLOGY - PAP
Chlamydia: NEGATIVE
Comment: NEGATIVE
Comment: NEGATIVE
Comment: NORMAL
Diagnosis: NEGATIVE
High risk HPV: NEGATIVE
Neisseria Gonorrhea: NEGATIVE

## 2023-07-11 NOTE — Telephone Encounter (Signed)
Will route to provider for review and close.

## 2023-07-17 ENCOUNTER — Encounter: Payer: Self-pay | Admitting: Student in an Organized Health Care Education/Training Program

## 2023-07-17 ENCOUNTER — Ambulatory Visit
Payer: BC Managed Care – PPO | Attending: Student in an Organized Health Care Education/Training Program | Admitting: Student in an Organized Health Care Education/Training Program

## 2023-07-17 DIAGNOSIS — M792 Neuralgia and neuritis, unspecified: Secondary | ICD-10-CM | POA: Insufficient documentation

## 2023-07-17 DIAGNOSIS — G5793 Unspecified mononeuropathy of bilateral lower limbs: Secondary | ICD-10-CM | POA: Insufficient documentation

## 2023-07-17 DIAGNOSIS — M5416 Radiculopathy, lumbar region: Secondary | ICD-10-CM | POA: Diagnosis not present

## 2023-07-17 DIAGNOSIS — G894 Chronic pain syndrome: Secondary | ICD-10-CM | POA: Diagnosis not present

## 2023-07-17 DIAGNOSIS — G5771 Causalgia of right lower limb: Secondary | ICD-10-CM | POA: Diagnosis present

## 2023-07-17 MED ORDER — DULOXETINE HCL 60 MG PO CPEP
60.0000 mg | ORAL_CAPSULE | Freq: Every day | ORAL | 1 refills | Status: AC
Start: 2023-07-17 — End: 2024-01-13

## 2023-07-17 NOTE — Progress Notes (Signed)
Safety precautions to be maintained throughout the outpatient stay will include: orient to surroundings, keep bed in low position, maintain call bell within reach at all times, provide assistance with transfer out of bed and ambulation.  

## 2023-07-17 NOTE — Patient Instructions (Signed)

## 2023-07-17 NOTE — Progress Notes (Signed)
PROVIDER NOTE: Information contained herein reflects review and annotations entered in association with encounter. Interpretation of such information and data should be left to medically-trained personnel. Information provided to patient can be located elsewhere in the medical record under "Patient Instructions". Document created using STT-dictation technology, any transcriptional errors that may result from process are unintentional.    Patient: Valerie Welch  Service Category: E/M  Provider: Edward Jolly, MD  DOB: 1971/06/04  DOS: 07/17/2023  Referring Provider: Barbette Reichmann, MD  MRN: [id number]  Specialty: Interventional Pain Management  PCP: Barbette Reichmann, MD  Type: Established Patient  Setting: Ambulatory outpatient    Location: Office  Delivery: Face-to-face     HPI  [name], a 52 y.o. year old female, is here today because of her No primary diagnosis found.. [name] primary complain today is Back Pain  Pertinent problems: [name] has History of foot surgery; Generalized anxiety disorder; Neuropathic pain of both legs; Complex regional pain syndrome type II of right lower limb; and Neuropathic pain of right foot on their pertinent problem list. Pain Assessment: Severity of Chronic pain is reported as a  (right 2/10, left 3/10)/10. Location: Back Lower/denies any radiating. Onset: More than a month ago. Quality: Aching, Sore, Nagging. Timing: Constant. Modifying factor(s): procedure, heat,ice. Vitals:  height is 5\' 1"  (1.549 m) and weight is 180 lb (81.6 kg). Her temperature is 97.3 F (36.3 C) (abnormal). Her blood pressure is 129/86 and her pulse is 96. Her respiration is 16 and oxygen saturation is 100%.  BMI: Estimated body mass index is 34.01 kg/m as calculated from the following:   Height as of this encounter: 5\' 1"  (1.549 m).   Weight as of this encounter: 180 lb (81.6 kg). Last encounter: 05/01/2023. Last procedure:  06/12/2023.  Reason for encounter: post-procedure evaluation and assessment.     Post-procedure evaluation   Procedure: Lumbar Facet, Medial Branch Radiofrequency Ablation (RFA) #1  Laterality: Left (-LT)  Level: L3, L4, and L5 Medial Branch Level(s). These levels will denervate the L3-4 and L4-5 lumbar facet joints.  Imaging: Fluoroscopy-guided         Anesthesia: Local anesthesia (1-2% Lidocaine) Anxiolysis: IV Versed 2.0 mg DOS: 06/12/2023  Performed by: Edward Jolly, MD  Procedure: Lumbar Facet, Medial Branch Radiofrequency Ablation (RFA) #1  Laterality: Right (-RT)  Level: L3, L4, and L5 Medial Branch Level(s). These levels will denervate the L1-2, L2-3, L3-4, L4-5, and L5-S1 lumbar facet joints.  Imaging: Fluoroscopy-guided         Anesthesia: Local anesthesia (1-2% Lidocaine) Anxiolysis: IV Versed 2.0 mg DOS: 05/27/2023  Performed by: Edward Jolly, MD  Purpose: Therapeutic/Palliative Indications: Low back pain severe enough to impact quality of life or function. Indications: 1. Lumbar facet arthropathy   2. Chronic pain syndrome   3. Lumbar spondylosis    [name] has been dealing with the above chronic pain for longer than three months and has either failed to respond, was unable to tolerate, or simply did not get enough benefit from other more conservative therapies including, but not limited to: 1. Over-the-counter medications 2. Anti-inflammatory medications 3. Muscle relaxants 4. Membrane stabilizers 5. Opioids 6. Physical therapy and/or chiropractic manipulation 7. Modalities (Heat, ice, etc.) 8. Invasive techniques such as nerve blocks. [name] has attained more than 50% relief of the pain from a series of diagnostic injections conducted in separate occasions.  Pain Score: Pre-procedure: 4 /10 Post-procedure: 4 /10     Effectiveness:  Initial hour after procedure:  100 %  Subsequent 4-6 hours post-procedure: 100 %  Analgesia past  initial 6 hours: 50 % (current)  Ongoing improvement:  Analgesic:  50% Function: [name] reports improvement in function ROM: [name] reports improvement in ROM   ROS  Constitutional: Denies any fever or chills Gastrointestinal: No reported hemesis, hematochezia, vomiting, or acute GI distress Musculoskeletal: Denies any acute onset joint swelling, redness, loss of ROM, or weakness Neurological: No reported episodes of acute onset apraxia, aphasia, dysarthria, agnosia, amnesia, paralysis, loss of coordination, or loss of consciousness  Medication Review  ALPRAZolam, Ashwagandha, B-12, Bepotastine Besilate, DSS, DULoxetine, Fluticasone-Salmeterol, HYDROcodone-Acetaminophen, NON FORMULARY, albuterol, amLODipine, azelastine, buPROPion, cetirizine, diclofenac sodium, gabapentin, iron polysaccharides, levothyroxine, montelukast, multivitamin with minerals, pantoprazole, polyethylene glycol, promethazine, rosuvastatin, tinidazole, and trolamine salicylate  History Review  Allergy: [name] is allergic to beef-derived products, other, percocet [oxycodone-acetaminophen], and pitocin [oxytocin]. Drug: [name]  reports no history of drug use. Alcohol:  reports current alcohol use. Tobacco:  reports that she has never smoked. She has never used smokeless tobacco. Social: [name]  reports that she has never smoked. She has never used smokeless tobacco. She reports current alcohol use. She reports that she does not use drugs. Medical:  has a past medical history of Anemia, Arthritis, Asthma, Barrett's esophagus, Bursitis, Depression, Fibromyalgia, GERD (gastroesophageal reflux disease), History of hiatal hernia, Hypothyroidism, Nontoxic uninodular goiter, and Pernicious anemia. Surgical: [name]  has a past surgical history that includes Cesarean section; Cholecystectomy, laparoscopic; Ankle arthroscopy (Right, 09/26/2018); Tenotomy / flexor tendon  transfer (Right, 09/26/2018); Arthrodesis metatarsalphalangeal joint (mtpj) (Right, 09/26/2018); Exostectectomy toe (Right, 09/26/2018); Cholecystectomy; Colonoscopy with propofol (N/A, 06/16/2020); Esophagogastroduodenoscopy (egd) with propofol (N/A, 06/16/2020); Robotic assisted laparoscopic hysterectomy and salpingectomy (N/A, 07/05/2020); Colonoscopy with propofol (N/A, 02/15/2023); and Esophagogastroduodenoscopy (egd) with propofol (N/A, 02/15/2023). Family: family history includes Cancer in her paternal grandfather; Diabetes in her mother; Heart disease in her mother; Hypertension in her maternal grandfather; Stroke in her brother, father, and mother.  Laboratory Chemistry Profile   Renal Lab Results  Component Value Date   BUN 7 07/01/2020   CREATININE 1.00 07/01/2020   GFRAA >60 07/01/2020   GFRNONAA >60 07/01/2020    Hepatic No results found for: "AST", "ALT", "ALBUMIN", "ALKPHOS", "HCVAB", "AMYLASE", "LIPASE", "AMMONIA"  Electrolytes Lab Results  Component Value Date   NA 139 07/01/2020   K 4.5 07/01/2020   CL 108 07/01/2020   CALCIUM 9.2 07/01/2020    Bone No results found for: "VD25OH", "VD125OH2TOT", "QM5784ON6", "EX5284XL2", "25OHVITD1", "25OHVITD2", "25OHVITD3", "TESTOFREE", "TESTOSTERONE"  Inflammation (CRP: Acute Phase) (ESR: Chronic Phase) No results found for: "CRP", "ESRSEDRATE", "LATICACIDVEN"       Note: Above Lab results reviewed.  Recent Imaging Review  DG PAIN CLINIC C-ARM 1-60 MIN NO REPORT Fluoro was used, but no Radiologist interpretation will be provided.  Please refer to "NOTES" tab for provider progress note. Note: Reviewed        Physical Exam  General appearance: Well nourished, well developed, and well hydrated. In no apparent acute distress Mental status: Alert, oriented x 3 (person, place, & time)       Respiratory: No evidence of acute respiratory distress Eyes: PERLA Vitals: BP 129/86   Pulse 96   Temp (!) 97.3 F (36.3 C)   Resp 16   Ht 5'  1" (1.549 m)   Wt 180 lb (81.6 kg)   LMP 09/10/2019 Comment: sexually active  SpO2 100%   BMI 34.01 kg/m  BMI: Estimated body mass index is 34.01  kg/m as calculated from the following:   Height as of this encounter: 5\' 1"  (1.549 m).   Weight as of this encounter: 180 lb (81.6 kg). Ideal: Ideal body weight: 47.8 kg (105 lb 6.1 oz) Adjusted ideal body weight: 61.3 kg (135 lb 3.6 oz)  Assessment   Diagnosis Status  1. Lumbar radiculopathy   2. Complex regional pain syndrome type II of right lower limb   3. Neuropathic pain of both legs   4. Neuropathic pain of right foot   5. Chronic pain syndrome    Controlled Controlled Controlled     Plan of Care    Ms. LAFREDA CASEBEER has a current medication list which includes the following long-term medication(s): amlodipine, azelastine, cetirizine, gabapentin, iron polysaccharides, levothyroxine, montelukast, pantoprazole, promethazine, and duloxetine.  Pharmacotherapy (Medications Ordered): Meds ordered this encounter  Medications   DULoxetine (CYMBALTA) 60 MG capsule    Sig: Take 1 capsule (60 mg total) by mouth daily.    Dispense:  90 capsule    Refill:  1   Orders:  No orders of the defined types were placed in this encounter.  Follow-up plan:   Return for patient will call to schedule F2F appt prn.      Right lower extremity CRPS, lumbar sympathetic nerve block denied by insurance.  Right L4-5 ESI #2 01/01/22, #3 02/28/22, #4 11/16/22, Consider spinal cord stimulator trial in future.  Bilateral L3, L4, L5 medial branch nerve block 01/16/2023, 03/20/23. Right L3,4,5 05/27/23, Left L3,4,5 RFA 06/12/23      Recent Visits Date Type Provider Dept  06/12/23 Procedure visit Edward Jolly, MD [facility]  05/27/23 Procedure visit Edward Jolly, MD [facility]  05/01/23 Office Visit Edward Jolly, MD [facility]  Showing recent visits within past 90 days and meeting all other requirements Today's  Visits Date Type Provider Dept  07/17/23 Office Visit Edward Jolly, MD [facility]  Showing today's visits and meeting all other requirements Future Appointments Date Type Provider Dept  09/24/23 Appointment Edward Jolly, MD [facility]  Showing future appointments within next 90 days and meeting all other requirements  I discussed the assessment and treatment plan with the patient. The patient was provided an opportunity to ask questions and all were answered. The patient agreed with the plan and demonstrated an understanding of the instructions.  Patient advised to call back or seek an in-person evaluation if the symptoms or condition worsens.  Duration of encounter: 20 minutes.  Total time on encounter, as per AMA guidelines included both the face-to-face and non-face-to-face time personally spent by the physician and/or other qualified health care professional(s) on the day of the encounter (includes time in activities that require the physician or other qualified health care professional and does not include time in activities normally performed by clinical staff). Physician's time may include the following activities when performed: Preparing to see the patient (e.g., pre-charting review of records, searching for previously ordered imaging, lab work, and nerve conduction tests) Review of prior analgesic pharmacotherapies. Reviewing PMP Interpreting ordered tests (e.g., lab work, imaging, nerve conduction tests) Performing post-procedure evaluations, including interpretation of diagnostic procedures Obtaining and/or reviewing separately obtained history Performing a medically appropriate examination and/or evaluation Counseling and educating the patient/family/caregiver Ordering medications, tests, or procedures Referring and communicating with other health care professionals (when not separately reported) Documenting clinical information in the electronic or other  health record Independently interpreting results (not separately reported) and communicating results to the patient/  family/caregiver Care coordination (not separately reported)  Note by: Edward Jolly, MD Date: 07/17/2023; Time: 3:51 PM

## 2023-07-24 ENCOUNTER — Ambulatory Visit: Payer: BC Managed Care – PPO | Admitting: Student in an Organized Health Care Education/Training Program

## 2023-09-16 HISTORY — PX: HIATAL HERNIA REPAIR: SHX195

## 2023-09-24 ENCOUNTER — Encounter: Payer: Self-pay | Admitting: Student in an Organized Health Care Education/Training Program

## 2023-09-24 ENCOUNTER — Ambulatory Visit
Payer: BC Managed Care – PPO | Attending: Student in an Organized Health Care Education/Training Program | Admitting: Student in an Organized Health Care Education/Training Program

## 2023-09-24 VITALS — BP 126/89 | HR 95 | Temp 97.3°F | Resp 18 | Ht 61.0 in | Wt 174.0 lb

## 2023-09-24 DIAGNOSIS — G5771 Causalgia of right lower limb: Secondary | ICD-10-CM | POA: Insufficient documentation

## 2023-09-24 DIAGNOSIS — M47816 Spondylosis without myelopathy or radiculopathy, lumbar region: Secondary | ICD-10-CM | POA: Diagnosis present

## 2023-09-24 DIAGNOSIS — G894 Chronic pain syndrome: Secondary | ICD-10-CM | POA: Insufficient documentation

## 2023-09-24 DIAGNOSIS — M5416 Radiculopathy, lumbar region: Secondary | ICD-10-CM | POA: Insufficient documentation

## 2023-09-24 DIAGNOSIS — M160 Bilateral primary osteoarthritis of hip: Secondary | ICD-10-CM | POA: Diagnosis not present

## 2023-09-24 DIAGNOSIS — M1611 Unilateral primary osteoarthritis, right hip: Secondary | ICD-10-CM | POA: Insufficient documentation

## 2023-09-24 DIAGNOSIS — M4726 Other spondylosis with radiculopathy, lumbar region: Secondary | ICD-10-CM

## 2023-09-24 DIAGNOSIS — G5793 Unspecified mononeuropathy of bilateral lower limbs: Secondary | ICD-10-CM | POA: Insufficient documentation

## 2023-09-24 DIAGNOSIS — M792 Neuralgia and neuritis, unspecified: Secondary | ICD-10-CM | POA: Insufficient documentation

## 2023-09-24 DIAGNOSIS — M1612 Unilateral primary osteoarthritis, left hip: Secondary | ICD-10-CM | POA: Diagnosis present

## 2023-09-24 MED ORDER — DULOXETINE HCL 60 MG PO CPEP: 60.0000 mg | ORAL_CAPSULE | Freq: Every day | ORAL | 1 refills | Status: DC

## 2023-09-24 NOTE — Progress Notes (Signed)
PROVIDER NOTE: Information contained herein reflects review and annotations entered in association with encounter. Interpretation of such information and data should be left to medically-trained personnel. Information provided to patient can be located elsewhere in the medical record under "Patient Instructions". Document created using STT-dictation technology, any transcriptional errors that may result from process are unintentional.    Patient: Valerie Welch  Service Category: E/M  Provider: Edward Jolly, MD  DOB: 12-Apr-1971  DOS: 09/24/2023  Referring Provider: Barbette Reichmann, MD  MRN: [id number]  Specialty: Interventional Pain Management  PCP: Barbette Reichmann, MD  Type: Established Patient  Setting: Ambulatory outpatient    Location: Office  Delivery: Face-to-face     HPI  Valerie Welch, a 52 y.o. year old female, is here today because of her Lumbar radiculopathy [M54.16]. Ms. Kaina primary complain today is Back Pain (lower)  Pertinent problems: Ms. Srader has History of foot surgery; Generalized anxiety disorder; Neuropathic pain of both legs; Complex regional pain syndrome type II of right lower limb; and Neuropathic pain of right foot on their pertinent problem list. Pain Assessment: Severity of Chronic pain is reported as a 6 /10. Location: Back Right, Lower/right leg to the knee. Onset: More than a month ago. Quality: Constant. Timing: Constant. Modifying factor(s): Vicodin, Neurontin, heat. Vitals:  height is 5\' 1"  (1.549 m) and weight is 174 lb (78.9 kg). Her temporal temperature is 97.3 F (36.3 C) (abnormal). Her blood pressure is 126/89 and her pulse is 95. Her respiration is 18 and oxygen saturation is 100%.  BMI: Estimated body mass index is 32.88 kg/m as calculated from the following:   Height as of this encounter: 5\' 1"  (1.549 m).   Weight as of this encounter: 174 lb (78.9 kg). Last encounter: 07/17/2023. Last procedure: 06/12/2023.  Reason  for encounter:   Follows up for increased low back pain and right leg pain after ROBOT ASSISTED LAPAROSCOPIC HIATAL HERNIA REPAIR WITH GASTROPEXY 09/16/23 Her last epidural was 11/21/2022 which provided her with 65% pain relief for about 6 to 8 months.  Given return of pain, we discussed repeating lumbar ESI as below. I will also refill her Cymbalta as below  ROS  Constitutional: Denies any fever or chills Gastrointestinal: No reported hemesis, hematochezia, vomiting, or acute GI distress Musculoskeletal:  Low back and right leg pain Neurological: No reported episodes of acute onset apraxia, aphasia, dysarthria, agnosia, amnesia, paralysis, loss of coordination, or loss of consciousness  Medication Review  ALPRAZolam, Ashwagandha, B-12, Bepotastine Besilate, DSS, DULoxetine, Fluticasone-Salmeterol, HYDROcodone-Acetaminophen, NON FORMULARY, albuterol, amLODipine, azelastine, buPROPion, cetirizine, diclofenac sodium, gabapentin, iron polysaccharides, levothyroxine, montelukast, multivitamin with minerals, pantoprazole, polyethylene glycol, promethazine, rosuvastatin, tinidazole, and trolamine salicylate  History Review  Allergy: Ms. Overgaard is allergic to beef-derived products, other, percocet [oxycodone-acetaminophen], and pitocin [oxytocin]. Drug: Ms. Mcdermitt  reports no history of drug use. Alcohol:  reports current alcohol use. Tobacco:  reports that she has never smoked. She has never used smokeless tobacco. Social: Ms. Arroyave  reports that she has never smoked. She has never used smokeless tobacco. She reports current alcohol use. She reports that she does not use drugs. Medical:  has a past medical history of Anemia, Arthritis, Asthma, Barrett's esophagus, Bursitis, Depression, Fibromyalgia, GERD (gastroesophageal reflux disease), History of hiatal hernia, Hypothyroidism, Nontoxic uninodular goiter, and Pernicious anemia. Surgical: Ms. Schwebel  has a past surgical  history that includes Cesarean section; Cholecystectomy, laparoscopic; Ankle arthroscopy (Right, 09/26/2018); Tenotomy / flexor tendon transfer (Right, 09/26/2018); Arthrodesis metatarsalphalangeal joint (mtpj) (Right, 09/26/2018); Exostectectomy  toe (Right, 09/26/2018); Cholecystectomy; Colonoscopy with propofol (N/A, 06/16/2020); Esophagogastroduodenoscopy (egd) with propofol (N/A, 06/16/2020); Robotic assisted laparoscopic hysterectomy and salpingectomy (N/A, 07/05/2020); Colonoscopy with propofol (N/A, 02/15/2023); and Esophagogastroduodenoscopy (egd) with propofol (N/A, 02/15/2023). Family: family history includes Cancer in her paternal grandfather; Diabetes in her mother; Heart disease in her mother; Hypertension in her maternal grandfather; Stroke in her brother, father, and mother.  Laboratory Chemistry Profile   Renal Lab Results  Component Value Date   BUN 7 07/01/2020   CREATININE 1.00 07/01/2020   GFRAA >60 07/01/2020   GFRNONAA >60 07/01/2020    Hepatic No results found for: "AST", "ALT", "ALBUMIN", "ALKPHOS", "HCVAB", "AMYLASE", "LIPASE", "AMMONIA"  Electrolytes Lab Results  Component Value Date   NA 139 07/01/2020   K 4.5 07/01/2020   CL 108 07/01/2020   CALCIUM 9.2 07/01/2020    Bone No results found for: "VD25OH", "VD125OH2TOT", "ZO1096EA5", "WU9811BJ4", "25OHVITD1", "25OHVITD2", "25OHVITD3", "TESTOFREE", "TESTOSTERONE"  Inflammation (CRP: Acute Phase) (ESR: Chronic Phase) No results found for: "CRP", "ESRSEDRATE", "LATICACIDVEN"       Note: Above Lab results reviewed.     Physical Exam  General appearance: Well nourished, well developed, and well hydrated. In no apparent acute distress Mental status: Alert, oriented x 3 (person, place, & time)       Respiratory: No evidence of acute respiratory distress Eyes: PERLA Vitals: BP 126/89   Pulse 95   Temp (!) 97.3 F (36.3 C) (Temporal)   Resp 18   Ht 5\' 1"  (1.549 m)   Wt 174 lb (78.9 kg)   LMP 09/10/2019 Comment:  sexually active  SpO2 100%   BMI 32.88 kg/m  BMI: Estimated body mass index is 32.88 kg/m as calculated from the following:   Height as of this encounter: 5\' 1"  (1.549 m).   Weight as of this encounter: 174 lb (78.9 kg). Ideal: Ideal body weight: 47.8 kg (105 lb 6.1 oz) Adjusted ideal body weight: 60.2 kg (132 lb 13.2 oz)  Lumbar Spine Area Exam  Skin & Axial Inspection: No masses, redness, or swelling Alignment: Symmetrical Functional ROM: Pain restricted ROM       Stability: No instability detected Muscle Tone/Strength: Functionally intact. No obvious neuro-muscular anomalies detected. Sensory (Neurological): Dermatomal pain pattern right L4-L5 Palpation: No palpable anomalies       Provocative Tests: Hyperextension/rotation test: deferred today       Lumbar quadrant test (Kemp's test): deferred today           Lower Extremity Exam      Side: Right lower extremity   Side: Left lower extremity  Stability: No instability observed           Stability: No instability observed          Skin & Extremity Inspection: Evidence of prior arthroplastic surgery redness, decreased hair growth, cool to touch, swelling   Skin & Extremity Inspection: Skin color, temperature, and hair growth are WNL. No peripheral edema or cyanosis. No masses, redness, swelling, asymmetry, or associated skin lesions. No contractures.  Functional ROM: Pain restricted ROM                   Functional ROM: Unrestricted ROM                  Muscle Tone/Strength: Functionally intact. No obvious neuro-muscular anomalies detected.   Muscle Tone/Strength: Functionally intact. No obvious neuro-muscular anomalies detected.  Sensory (Neurological): Neurogenic pain pattern positive straight leg raise test  Sensory (Neurological): Unimpaired        DTR: Patellar: deferred today Achilles: deferred today Plantar: deferred today   DTR: Patellar: deferred today Achilles: deferred today Plantar: deferred today   Palpation: No palpable anomalies   Palpation: No palpable anomalies    Assessment   Diagnosis Status  1. Lumbar radiculopathy   2. Lumbar facet arthropathy   3. Lumbar spondylosis   4. Arthritis of right hip   5. Arthritis of left hip   6. Chronic pain syndrome   7. Complex regional pain syndrome type II of right lower limb   8. Neuropathic pain of both legs   9. Neuropathic pain of right foot    Having a Flare-up Controlled Controlled   Plan of Care    Ms. DELYN SCHELLINGER has a current medication list which includes the following long-term medication(s): amlodipine, azelastine, cetirizine, gabapentin, iron polysaccharides, levothyroxine, montelukast, pantoprazole, promethazine, and duloxetine.  Pharmacotherapy (Medications Ordered): Meds ordered this encounter  Medications   DULoxetine (CYMBALTA) 60 MG capsule    Sig: Take 1 capsule (60 mg total) by mouth daily.    Dispense:  90 capsule    Refill:  1   Orders:  Orders Placed This Encounter  Procedures   Lumbar Epidural Injection    Standing Status:   Future    Standing Expiration Date:   12/25/2023    Scheduling Instructions:     Procedure: Interlaminar Lumbar Epidural Steroid injection (LESI)            Laterality: RIGHT L4-5     Sedation: PO Valium     Timeframe: ASAA    Order Specific Question:   Where will this procedure be performed?    Answer:   [facility]   Follow-up plan:   Return in about 2 weeks (around 10/08/2023) for L4-5 ESI , in clinic (PO Valium).      Right lower extremity CRPS, lumbar sympathetic nerve block denied by insurance.  Right L4-5 ESI #2 01/01/22, #3 02/28/22, #4 11/16/22, Consider spinal cord stimulator trial in future.  Bilateral L3, L4, L5 medial branch nerve block 01/16/2023, 03/20/23. Right L3,4,5 05/27/23, Left L3,4,5 RFA 06/12/23     Recent Visits Date Type Provider Dept  07/17/23 Office Visit Edward Jolly, MD [facility]  Showing recent visits within past 90  days and meeting all other requirements Today's Visits Date Type Provider Dept  09/24/23 Office Visit Edward Jolly, MD [facility]  Showing today's visits and meeting all other requirements Future Appointments Date Type Provider Dept  10/09/23 Appointment Edward Jolly, MD [facility]  Showing future appointments within next 90 days and meeting all other requirements  I discussed the assessment and treatment plan with the patient. The patient was provided an opportunity to ask questions and all were answered. The patient agreed with the plan and demonstrated an understanding of the instructions.  Patient advised to call back or seek an in-person evaluation if the symptoms or condition worsens.  Duration of encounter: .  Total time on encounter, as per AMA guidelines included both the face-to-face and non-face-to-face time personally spent by the physician and/or other qualified health care professional(s) on the day of the encounter (includes time in activities that require the physician or other qualified health care professional and does not include time in activities normally performed by clinical staff). Physician's time may include the following activities when performed: Preparing to see the patient (e.g., pre-charting review of records, searching for previously ordered  imaging, lab work, and nerve conduction tests) Review of prior analgesic pharmacotherapies. Reviewing PMP Interpreting ordered tests (e.g., lab work, imaging, nerve conduction tests) Performing post-procedure evaluations, including interpretation of diagnostic procedures Obtaining and/or reviewing separately obtained history Performing a medically appropriate examination and/or evaluation Counseling and educating the patient/family/caregiver Ordering medications, tests, or procedures Referring and communicating with other health care professionals (when not separately reported) Documenting  clinical information in the electronic or other health record Independently interpreting results (not separately reported) and communicating results to the patient/ family/caregiver Care coordination (not separately reported)  Note by: Edward Jolly, MD Date: 09/24/2023; Time: 9:57 AM

## 2023-09-24 NOTE — Patient Instructions (Signed)
 ______________________________________________________________________    Preparing for your procedure  Appointments: If you think you may not be able to keep your appointment, call 24-48 hours in advance to cancel. We need time to make it available to others.  During your procedure appointment there will be: No Prescription Refills. No disability issues to discussed. No medication changes or discussions.  Instructions: Food intake: Avoid eating anything solid for at least 8 hours prior to your procedure. Clear liquid intake: You may take clear liquids such as water up to 2 hours prior to your procedure. (No carbonated drinks. No soda.) Transportation: Unless otherwise stated by your physician, bring a driver. (Driver cannot be a Market researcher, Pharmacist, community, or any other form of public transportation.) Morning Medicines: Except for blood thinners, take all of your other morning medications with a sip of water. Make sure to take your heart and blood pressure medicines. If your blood pressure's lower number is above 100, the case will be rescheduled. Blood thinners: Make sure to stop your blood thinners as instructed.  If you take a blood thinner, but were not instructed to stop it, call our office [phone] and ask to talk to a nurse. Not stopping a blood thinner prior to certain procedures could lead to serious complications. Diabetics on insulin: Notify the staff so that you can be scheduled 1st case in the morning. If your diabetes requires high dose insulin, take only  of your normal insulin dose the morning of the procedure and notify the staff that you have done so. Preventing infections: Shower with an antibacterial soap the morning of your procedure.  Build-up your immune system: Take 1000 mg of Vitamin C with every meal (3 times a day) the day prior to your procedure. Antibiotics: Inform the nursing staff if you are taking any antibiotics or if you have any conditions that may require antibiotics  prior to procedures. (Example: recent joint implants)   Pregnancy: If you are pregnant make sure to notify the nursing staff. Not doing so may result in injury to the fetus, including death.  Sickness: If you have a cold, fever, or any active infections, call and cancel or reschedule your procedure. Receiving steroids while having an infection may result in complications. Arrival: You must be in the facility at least 30 minutes prior to your scheduled procedure. Tardiness: Your scheduled time is also the cutoff time. If you do not arrive at least 15 minutes prior to your procedure, you will be rescheduled.  Children: Do not bring any children with you. Make arrangements to keep them home. Dress appropriately: There is always a possibility that your clothing may get soiled. Avoid long dresses. Valuables: Do not bring any jewelry or valuables.  Reasons to call and reschedule or cancel your procedure: (Following these recommendations will minimize the risk of a serious complication.) Surgeries: Avoid having procedures within 2 weeks of any surgery. (Avoid for 2 weeks before or after any surgery). Flu Shots: Avoid having procedures within 2 weeks of a flu shots or . (Avoid for 2 weeks before or after immunizations). Barium: Avoid having a procedure within 7-10 days after having had a radiological study involving the use of radiological contrast. (Myelograms, Barium swallow or enema study). Heart attacks: Avoid any elective procedures or surgeries for the initial 6 months after a "Myocardial Infarction" (Heart Attack). Blood thinners: It is imperative that you stop these medications before procedures. Let us know if you if you take any blood thinner.  Infection: Avoid procedures during or within  two weeks of an infection (including chest colds or gastrointestinal problems). Symptoms associated with infections include: Localized redness, fever, chills, night sweats or profuse sweating, burning sensation  when voiding, cough, congestion, stuffiness, runny nose, sore throat, diarrhea, nausea, vomiting, cold or Flu symptoms, recent or current infections. It is specially important if the infection is over the area that we intend to treat. Heart and lung problems: Symptoms that may suggest an active cardiopulmonary problem include: cough, chest pain, breathing difficulties or shortness of breath, dizziness, ankle swelling, uncontrolled high or unusually low blood pressure, and/or palpitations. If you are experiencing any of these symptoms, cancel your procedure and contact your primary care physician for an evaluation.  Remember:  Regular Business hours are:  Monday to Thursday 8:00 AM to 4:00 PM  Provider's Schedule: Delano Metz, MD:  Procedure days: Tuesday and Thursday 7:30 AM to 4:00 PM  Edward Jolly, MD:  Procedure days: Monday and Wednesday 7:30 AM to 4:00 PM Last  Updated: 08/13/2023 ______________________________________________________________________

## 2023-10-09 ENCOUNTER — Ambulatory Visit
Admission: RE | Admit: 2023-10-09 | Discharge: 2023-10-09 | Disposition: A | Discharge status: Home / Self Care | Payer: BC Managed Care – PPO | Source: Ambulatory Visit | Attending: Student in an Organized Health Care Education/Training Program | Admitting: Student in an Organized Health Care Education/Training Program

## 2023-10-09 ENCOUNTER — Ambulatory Visit
Payer: BC Managed Care – PPO | Attending: Student in an Organized Health Care Education/Training Program | Admitting: Student in an Organized Health Care Education/Training Program

## 2023-10-09 ENCOUNTER — Encounter: Payer: Self-pay | Admitting: Student in an Organized Health Care Education/Training Program

## 2023-10-09 VITALS — BP 142/106 | HR 99 | Temp 98.1°F | Resp 15 | Ht 61.0 in | Wt 174.0 lb

## 2023-10-09 DIAGNOSIS — G894 Chronic pain syndrome: Secondary | ICD-10-CM | POA: Diagnosis present

## 2023-10-09 DIAGNOSIS — M5416 Radiculopathy, lumbar region: Secondary | ICD-10-CM | POA: Insufficient documentation

## 2023-10-09 DIAGNOSIS — M47816 Spondylosis without myelopathy or radiculopathy, lumbar region: Secondary | ICD-10-CM | POA: Insufficient documentation

## 2023-10-09 MED ORDER — DEXAMETHASONE SODIUM PHOSPHATE 10 MG/ML IJ SOLN
10.0000 mg | Freq: Once | INTRAMUSCULAR | Status: DC
  Filled 2023-10-09: qty 1

## 2023-10-09 MED ORDER — LIDOCAINE HCL 2 % IJ SOLN
20.0000 mL | Freq: Once | INTRAMUSCULAR | Status: DC
  Filled 2023-10-09: qty 20

## 2023-10-09 MED ORDER — DIAZEPAM 5 MG PO TABS
5.0000 mg | ORAL_TABLET | ORAL | Status: AC
  Administered 2023-10-09: 5 mg via ORAL

## 2023-10-09 MED ORDER — ROPIVACAINE HCL 2 MG/ML IJ SOLN
2.0000 mL | Freq: Once | INTRAMUSCULAR | Status: DC
  Filled 2023-10-09: qty 20

## 2023-10-09 MED ORDER — DIAZEPAM 5 MG PO TABS
ORAL_TABLET | ORAL | Status: AC
  Filled 2023-10-09: qty 1

## 2023-10-09 MED ORDER — SODIUM CHLORIDE 0.9% FLUSH: 2.0000 mL | Freq: Once | INTRAVENOUS | Status: DC

## 2023-10-09 MED ORDER — IOHEXOL 180 MG/ML  SOLN
10.0000 mL | Freq: Once | INTRAMUSCULAR | Status: DC
  Filled 2023-10-09: qty 20

## 2023-10-09 NOTE — Progress Notes (Signed)
Safety precautions to be maintained throughout the outpatient stay will include: orient to surroundings, keep bed in low position, maintain call bell within reach at all times, provide assistance with transfer out of bed and ambulation.  

## 2023-10-09 NOTE — Patient Instructions (Signed)

## 2023-10-09 NOTE — Progress Notes (Signed)
PROVIDER NOTE: Information contained herein reflects review and annotations entered in association with encounter. Interpretation of such information and data should be left to medically-trained personnel. Information provided to patient can be located elsewhere in the medical record under "Patient Instructions". Document created using STT-dictation technology, any transcriptional errors that may result from process are unintentional.    Patient: Valerie Welch  Service Category: Procedure Provider: Edward Jolly, MD DOB: Jan 02, 1971 DOS: 10/09/2023 Location: [facility] MRN: [id number] Setting: Ambulatory - outpatient Referring Provider: Barbette Reichmann, MD Type: Established Patient Specialty: Interventional Pain Management PCP: Barbette Reichmann, MD  Primary Reason for Visit: Interventional Pain Management Treatment. CC: Back Pain and Shoulder Pain (left)    Procedure:          Anesthesia, Analgesia, Anxiolysis:  Type: Therapeutic Inter-Laminar Epidural Steroid Injection  #1 in 2024 Region: Lumbar Level: L4-5 Level. Laterality: Right-Sided         Anesthesia: Local (1-2% Lidocaine)  Anxiolysis: PO Valium 5 mg Guidance: Fluoroscopy           Position: Prone with head of the table was raised to facilitate breathing.   Indications: 1. Lumbar radiculopathy   2. Chronic pain syndrome   3. Lumbar facet arthropathy      Pain Score: Pre-procedure: 7 /10 Post-procedure: 0-No pain/10    Pre-op H&P Assessment:  Ms. Pharris is a 52 y.o. (year old), female patient, seen today for interventional treatment. She  has a past surgical history that includes Cesarean section; Cholecystectomy, laparoscopic; Ankle arthroscopy (Right, 09/26/2018); Tenotomy / flexor tendon transfer (Right, 09/26/2018); Arthrodesis metatarsalphalangeal joint (mtpj) (Right, 09/26/2018); Exostectectomy toe (Right, 09/26/2018); Cholecystectomy; Colonoscopy with propofol (N/A, 06/16/2020);  Esophagogastroduodenoscopy (egd) with propofol (N/A, 06/16/2020); Robotic assisted laparoscopic hysterectomy and salpingectomy (N/A, 07/05/2020); Colonoscopy with propofol (N/A, 02/15/2023); Esophagogastroduodenoscopy (egd) with propofol (N/A, 02/15/2023); and Hiatal hernia repair (09/16/2023). Ms. James has a current medication list which includes the following prescription(s): albuterol, alprazolam, amlodipine, ashwagandha, azelastine, bepreve, bupropion, cetirizine, b-12, diclofenac sodium, dss, duloxetine, fluticasone-salmeterol, gabapentin, hydrocodone-acetaminophen, hydrocodone-acetaminophen, iron polysaccharides, levothyroxine, montelukast, multivitamin with minerals, NON FORMULARY, pantoprazole, polyethylene glycol, promethazine, rosuvastatin, trolamine salicylate, and tinidazole, and the following Facility-Administered Medications: dexamethasone, iohexol, lidocaine, ropivacaine (pf) 2 mg/ml (0.2%), and sodium chloride flush. Her primarily concern today is the Back Pain and Shoulder Pain (left)   Initial Vital Signs:  Pulse/HCG Rate: 86  Temp:  98.1 F (36.7 C) Resp: 17 BP:  (!) 144/96 SpO2: 100 %  BMI: Estimated body mass index is 32.88 kg/m as calculated from the following:   Height as of this encounter: 5\' 1"  (1.549 m).   Weight as of this encounter: 174 lb (78.9 kg).  Risk Assessment: Allergies: Reviewed. She is allergic to beef-derived products, other, percocet [oxycodone-acetaminophen], and pitocin [oxytocin].  Allergy Precautions: None required Coagulopathies: Reviewed. None identified.  Blood-thinner therapy: None at this time Active Infection(s): Reviewed. None identified. Ms. Anker is afebrile  Site Confirmation: Ms. Condrey was asked to confirm the procedure and laterality before marking the site Procedure checklist: Completed Consent: Before the procedure and under the influence of no sedative(s), amnesic(s), or anxiolytics, the patient was  informed of the treatment options, risks and possible complications. To fulfill our ethical and legal obligations, as recommended by the American Medical Association's Code of Ethics, I have informed the patient of my clinical impression; the nature and purpose of the treatment or procedure; the risks, benefits, and possible complications of the intervention; the alternatives, including doing nothing; the risk(s) and benefit(s) of the alternative  treatment(s) or procedure(s); and the risk(s) and benefit(s) of doing nothing. The patient was provided information about the general risks and possible complications associated with the procedure. These may include, but are not limited to: failure to achieve desired goals, infection, bleeding, organ or nerve damage, allergic reactions, paralysis, and death. In addition, the patient was informed of those risks and complications associated to Spine-related procedures, such as failure to decrease pain; infection (i.e.: Meningitis, epidural or intraspinal abscess); bleeding (i.e.: epidural hematoma, subarachnoid hemorrhage, or any other type of intraspinal or peri-dural bleeding); organ or nerve damage (i.e.: Any type of peripheral nerve, nerve root, or spinal cord injury) with subsequent damage to sensory, motor, and/or autonomic systems, resulting in permanent pain, numbness, and/or weakness of one or several areas of the body; allergic reactions; (i.e.: anaphylactic reaction); and/or death. Furthermore, the patient was informed of those risks and complications associated with the medications. These include, but are not limited to: allergic reactions (i.e.: anaphylactic or anaphylactoid reaction(s)); adrenal axis suppression; blood sugar elevation that in diabetics may result in ketoacidosis or comma; water retention that in patients with history of congestive heart failure may result in shortness of breath, pulmonary edema, and decompensation with resultant heart  failure; weight gain; swelling or edema; medication-induced neural toxicity; particulate matter embolism and blood vessel occlusion with resultant organ, and/or nervous system infarction; and/or aseptic necrosis of one or more joints. Finally, the patient was informed that Medicine is not an exact science; therefore, there is also the possibility of unforeseen or unpredictable risks and/or possible complications that may result in a catastrophic outcome. The patient indicated having understood very clearly. We have given the patient no guarantees and we have made no promises. Enough time was given to the patient to ask questions, all of which were answered to the patient's satisfaction. Ms. Biel has indicated that she wanted to continue with the procedure. Attestation: I, the ordering provider, attest that I have discussed with the patient the benefits, risks, side-effects, alternatives, likelihood of achieving goals, and potential problems during recovery for the procedure that I have provided informed consent. Date  Time: 10/09/2023  9:01 AM  Pre-Procedure Preparation:  Monitoring: As per clinic protocol. Respiration, ETCO2, SpO2, BP, heart rate and rhythm monitor placed and checked for adequate function Safety Precautions: Patient was assessed for positional comfort and pressure points before starting the procedure. Time-out: I initiated and conducted the "Time-out" before starting the procedure, as per protocol. The patient was asked to participate by confirming the accuracy of the "Time Out" information. Verification of the correct person, site, and procedure were performed and confirmed by me, the nursing staff, and the patient. "Time-out" conducted as per Joint Commission's Universal Protocol (UP.01.01.01). Time: 0825  Description of Procedure:          Target Area: The interlaminar space, initially targeting the lower laminar border of the superior vertebral body. Approach: Paramedial  approach. Area Prepped: Entire Posterior Lumbar Region DuraPrep (Iodine Povacrylex [0.7% available iodine] and Isopropyl Alcohol, 74% w/w) Safety Precautions: Aspiration looking for blood return was conducted prior to all injections. At no point did we inject any substances, as a needle was being advanced. No attempts were made at seeking any paresthesias. Safe injection practices and needle disposal techniques used. Medications properly checked for expiration dates. SDV (single dose vial) medications used. Description of the Procedure: Protocol guidelines were followed. The procedure needle was introduced through the skin, ipsilateral to the reported pain, and advanced to the target area. Bone was  contacted and the needle walked caudad, until the lamina was cleared. The epidural space was identified using "loss-of-resistance technique" with 2-3 ml of PF-NaCl (0.9% NSS), in a 5cc LOR glass syringe.  Vitals:   10/09/23 0907 10/09/23 0924 10/09/23 0929 10/09/23 0934  BP: (!) 144/96 (!) 151/112 (!) 144/115 (!) 142/106  Pulse: 86 99 100 99  Resp: 17 20 16 15   Temp: 98.1 F (36.7 C)     SpO2: 100% 99% 99% 98%  Weight: 174 lb (78.9 kg)     Height: 5\' 1"  (1.549 m)          Start Time: 0925 hrs. End Time: 0929 hrs.  Materials:  Needle(s) Type: Epidural needle Gauge: 22G Length: 3.5-in Medication(s): Please see orders for medications and dosing details.  6 cc solution made of 3 cc of preservative-free saline, 2 cc of 0.2% ropivacaine, 1 cc of Decadron 10 mg/cc.  Imaging Guidance (Spinal):          Type of Imaging Technique: Fluoroscopy Guidance (Spinal) Indication(s): Assistance in needle guidance and placement for procedures requiring needle placement in or near specific anatomical locations not easily accessible without such assistance. Exposure Time: Please see nurses notes. Contrast: Before injecting any contrast, we confirmed that the patient did not have an allergy to iodine,  shellfish, or radiological contrast. Once satisfactory needle placement was completed at the desired level, radiological contrast was injected. Contrast injected under live fluoroscopy. No contrast complications. See chart for type and volume of contrast used. Fluoroscopic Guidance: I was personally present during the use of fluoroscopy. "Tunnel Vision Technique" used to obtain the best possible view of the target area. Parallax error corrected before commencing the procedure. "Direction-depth-direction" technique used to introduce the needle under continuous pulsed fluoroscopy. Once target was reached, antero-posterior, oblique, and lateral fluoroscopic projection used confirm needle placement in all planes. Images permanently stored in EMR. Interpretation: I personally interpreted the imaging intraoperatively. Adequate needle placement confirmed in multiple planes. Appropriate spread of contrast into desired area was observed. No evidence of afferent or efferent intravascular uptake. No intrathecal or subarachnoid spread observed. Permanent images saved into the patient's record.   Post-operative Assessment:  Post-procedure Vital Signs:  Pulse/HCG Rate: 99  Temp:  98.1 F (36.7 C) Resp: 15 BP:  (!) 142/106 SpO2: 98 %  EBL: None  Complications: No immediate post-treatment complications observed by team, or reported by patient.  Note: The patient tolerated the entire procedure well. A repeat set of vitals were taken after the procedure and the patient was kept under observation following institutional policy, for this type of procedure. Post-procedural neurological assessment was performed, showing return to baseline, prior to discharge. The patient was provided with post-procedure discharge instructions, including a section on how to identify potential problems. Should any problems arise concerning this procedure, the patient was given instructions to immediately contact us, at any time, without  hesitation. In any case, we plan to contact the patient by telephone for a follow-up status report regarding this interventional procedure.  Comments:  No additional relevant information.  Plan of Care  Orders:  Orders Placed This Encounter  Procedures   Radiofrequency,Lumbar    Standing Status:   Future    Standing Expiration Date:   01/09/2024    Scheduling Instructions:     Side(s): Bilateral     Level(s):L3, L4, L5,  Medial Branch Nerve(s)     Sedation: With Sedation     Scheduling Timeframe: As soon as Designer, industrial/product  Question:   Where will this procedure be performed?    Answer:   [facility]   [facility] C-ARM 1-60 MIN NO REPORT    Intraoperative interpretation by procedural physician at [facility].    Standing Status:   Standing    Number of Occurrences:   1    Order Specific Question:   Reason for exam:    Answer:   Assistance in needle guidance and placement for procedures requiring needle placement in or near specific anatomical locations not easily accessible without such assistance.    Medications ordered for procedure: Meds ordered this encounter  Medications   iohexol (OMNIPAQUE) 180 MG/ML injection 10 mL    Must be Myelogram-compatible. If not available, you may substitute with a water-soluble, non-ionic, hypoallergenic, myelogram-compatible radiological contrast medium.   lidocaine (XYLOCAINE) 2 % (with pres) injection 400 mg   diazepam (VALIUM) tablet 5 mg    Make sure Flumazenil is available in the pyxis when using this medication. If oversedation occurs, administer 0.2 mg IV over 15 sec. If after 45 sec no response, administer 0.2 mg again over 1 min; may repeat at 1 min intervals; not to exceed 4 doses (1 mg)   ropivacaine (PF) 2 mg/mL (0.2%) (NAROPIN) injection 2 mL   sodium chloride flush (NS) 0.9 % injection 2 mL   dexamethasone (DECADRON) injection 10 mg   Medications administered: We administered diazepam.  See  the medical record for exact dosing, route, and time of administration.  Follow-up plan:   Return in about 9 weeks (around 12/11/2023) for B/L L3, 4, 5 RFA, ECT.     Recent Visits Date Type Provider Dept  09/24/23 Office Visit Edward Jolly, MD [facility]  07/17/23 Office Visit Edward Jolly, MD [facility]  Showing recent visits within past 90 days and meeting all other requirements Today's Visits Date Type Provider Dept  10/09/23 Procedure visit Edward Jolly, MD [facility]  Showing today's visits and meeting all other requirements Future Appointments No visits were found meeting these conditions. Showing future appointments within next 90 days and meeting all other requirements  Disposition: Discharge home  Discharge (Date  Time): 10/09/2023;   hrs.   Primary Care Physician: Barbette Reichmann, MD Location: [facility] Note by: Edward Jolly, MD Date: 10/09/2023; Time: 9:37 AM  Disclaimer:  Medicine is not an exact science. The only guarantee in medicine is that nothing is guaranteed. It is important to note that the decision to proceed with this intervention was based on the information collected from the patient. The Data and conclusions were drawn from the patient's questionnaire, the interview, and the physical examination. Because the information was provided in large part by the patient, it cannot be guaranteed that it has not been purposely or unconsciously manipulated. Every effort has been made to obtain as much relevant data as possible for this evaluation. It is important to note that the conclusions that lead to this procedure are derived in large part from the available data. Always take into account that the treatment will also be dependent on availability of resources and existing treatment guidelines, considered by other Pain Management Practitioners as being common knowledge and practice, at the time of the  intervention. For Medico-Legal purposes, it is also important to point out that variation in procedural techniques and pharmacological choices are the acceptable norm. The indications, contraindications, technique, and results of the above procedure should only be interpreted and judged by  a Board-Certified Interventional Pain Specialist with extensive familiarity and expertise in the same exact procedure and technique.

## 2023-10-10 ENCOUNTER — Telehealth: Payer: Self-pay

## 2023-10-10 NOTE — Telephone Encounter (Signed)
Post procedure follow up.  LM 

## 2023-11-15 ENCOUNTER — Encounter: Payer: Self-pay | Admitting: *Deleted

## 2023-11-29 ENCOUNTER — Encounter: Payer: Self-pay | Admitting: *Deleted

## 2023-12-06 ENCOUNTER — Ambulatory Visit
Admission: RE | Admit: 2023-12-06 | Discharge: 2023-12-06 | Disposition: A | Discharge status: Home / Self Care | Payer: BC Managed Care – PPO | Attending: Gastroenterology | Admitting: Gastroenterology

## 2023-12-06 ENCOUNTER — Ambulatory Visit: Payer: BC Managed Care – PPO | Admitting: Anesthesiology

## 2023-12-06 ENCOUNTER — Encounter: Payer: Self-pay | Admitting: *Deleted

## 2023-12-06 ENCOUNTER — Encounter
Admission: RE | Disposition: A | Discharge status: Home / Self Care | Payer: Self-pay | Source: Home / Self Care | Attending: Gastroenterology

## 2023-12-06 DIAGNOSIS — Z7951 Long term (current) use of inhaled steroids: Secondary | ICD-10-CM | POA: Diagnosis not present

## 2023-12-06 DIAGNOSIS — Z1211 Encounter for screening for malignant neoplasm of colon: Secondary | ICD-10-CM | POA: Diagnosis present

## 2023-12-06 DIAGNOSIS — G8929 Other chronic pain: Secondary | ICD-10-CM | POA: Diagnosis not present

## 2023-12-06 DIAGNOSIS — M797 Fibromyalgia: Secondary | ICD-10-CM | POA: Insufficient documentation

## 2023-12-06 DIAGNOSIS — I10 Essential (primary) hypertension: Secondary | ICD-10-CM | POA: Insufficient documentation

## 2023-12-06 DIAGNOSIS — E669 Obesity, unspecified: Secondary | ICD-10-CM | POA: Diagnosis not present

## 2023-12-06 DIAGNOSIS — K219 Gastro-esophageal reflux disease without esophagitis: Secondary | ICD-10-CM | POA: Diagnosis not present

## 2023-12-06 DIAGNOSIS — J45909 Unspecified asthma, uncomplicated: Secondary | ICD-10-CM | POA: Insufficient documentation

## 2023-12-06 DIAGNOSIS — Z6834 Body mass index (BMI) 34.0-34.9, adult: Secondary | ICD-10-CM | POA: Insufficient documentation

## 2023-12-06 HISTORY — PX: COLONOSCOPY WITH PROPOFOL: SHX5780

## 2023-12-06 SURGERY — COLONOSCOPY WITH PROPOFOL
Anesthesia: General

## 2023-12-06 MED ORDER — SODIUM CHLORIDE 0.9 % IV SOLN: INTRAVENOUS | Status: DC

## 2023-12-06 MED ORDER — LIDOCAINE HCL (PF) 2 % IJ SOLN
INTRAMUSCULAR | Status: AC
  Filled 2023-12-06: qty 5

## 2023-12-06 MED ORDER — DEXMEDETOMIDINE HCL IN NACL 200 MCG/50ML IV SOLN
INTRAVENOUS | Status: DC | PRN
  Administered 2023-12-06: 4 ug via INTRAVENOUS
  Administered 2023-12-06: 8 ug via INTRAVENOUS

## 2023-12-06 MED ORDER — LIDOCAINE HCL (CARDIAC) PF 100 MG/5ML IV SOSY
PREFILLED_SYRINGE | INTRAVENOUS | Status: DC | PRN
  Administered 2023-12-06: 50 mg via INTRAVENOUS

## 2023-12-06 MED ORDER — PROPOFOL 10 MG/ML IV BOLUS
INTRAVENOUS | Status: AC
  Filled 2023-12-06: qty 40

## 2023-12-06 MED ORDER — PROPOFOL 500 MG/50ML IV EMUL
INTRAVENOUS | Status: DC | PRN
  Administered 2023-12-06: 50 mg via INTRAVENOUS
  Administered 2023-12-06: 120 ug/kg/min via INTRAVENOUS

## 2023-12-06 NOTE — Transfer of Care (Signed)
Immediate Anesthesia Transfer of Care Note  Patient: Valerie Welch  Procedure(s) Performed: COLONOSCOPY WITH PROPOFOL  Patient Location: PACU and Endoscopy Unit  Anesthesia Type:General  Level of Consciousness: awake, alert , and oriented  Airway & Oxygen Therapy: Patient Spontanous Breathing  Post-op Assessment: Report given to RN and Post -op Vital signs reviewed and stable  Post vital signs: Reviewed and stable  Last Vitals:  Vitals Value Taken Time  BP    Temp    Pulse    Resp    SpO2      Last Pain:  Vitals:   12/06/23 0801  TempSrc: Temporal         Complications: No notable events documented.

## 2023-12-06 NOTE — Anesthesia Postprocedure Evaluation (Signed)
Anesthesia Post Note  Patient: Valerie Welch  Procedure(s) Performed: COLONOSCOPY WITH PROPOFOL  Patient location during evaluation: Endoscopy Anesthesia Type: General Level of consciousness: awake and alert Pain management: pain level controlled Vital Signs Assessment: post-procedure vital signs reviewed and stable Respiratory status: spontaneous breathing, nonlabored ventilation, respiratory function stable and patient connected to nasal cannula oxygen Cardiovascular status: blood pressure returned to baseline and stable Postop Assessment: no apparent nausea or vomiting Anesthetic complications: no   No notable events documented.   Last Vitals:  Vitals:   12/06/23 0930 12/06/23 0935  BP: 123/81 127/78  Pulse:    Resp: 16   Temp:    SpO2: 99% 100%    Last Pain:  Vitals:   12/06/23 0925  TempSrc: Temporal  PainSc: 0-No pain                 Louie Boston

## 2023-12-06 NOTE — Anesthesia Preprocedure Evaluation (Addendum)
Anesthesia Evaluation  Patient identified by MRN, date of birth, ID band Patient awake    Reviewed: Allergy & Precautions, NPO status , Patient's Chart, lab work & pertinent test results  History of Anesthesia Complications Negative for: history of anesthetic complications  Airway Mallampati: I  TM Distance: >3 FB Neck ROM: full    Dental no notable dental hx.    Pulmonary asthma    Pulmonary exam normal        Cardiovascular hypertension, On Medications Normal cardiovascular exam     Neuro/Psych  PSYCHIATRIC DISORDERS Anxiety Depression    negative neurological ROS     GI/Hepatic Neg liver ROS, hiatal hernia,GERD  Medicated,,  Endo/Other  Hypothyroidism    Renal/GU negative Renal ROS  negative genitourinary   Musculoskeletal  (+) Arthritis ,  Fibromyalgia -, narcotic dependent  Abdominal   Peds  Hematology  (+) Blood dyscrasia, anemia   Anesthesia Other Findings Past Medical History: No date: Anemia No date: Arthritis No date: Asthma No date: Barrett's esophagus No date: Bursitis     Comment:  hip, knees, shoulders No date: Depression No date: Fibromyalgia No date: GERD (gastroesophageal reflux disease) No date: History of hiatal hernia No date: Hypothyroidism No date: Nontoxic uninodular goiter No date: Pernicious anemia  Past Surgical History: 09/26/2018: ANKLE ARTHROSCOPY; Right     Comment:  Procedure: ANKLE ARTHROSCOPY/OCD REPAIR  &  DEBRIDEMENT;              EXTENSIVE;  Surgeon: Gwyneth Revels, DPM;  Location: [facility]              ORS;  Service: Podiatry;  Laterality: Right; 09/26/2018: ARTHRODESIS METATARSALPHALANGEAL JOINT (MTPJ); Right     Comment:  Procedure: ARTHRODESIS METATARSALPHALANGEAL JOINT (MTPJ)              FUSION;  Surgeon: Gwyneth Revels, DPM;  Location: [facility]               ORS;  Service: Podiatry;  Laterality: Right; No date: CESAREAN SECTION     Comment:  x2 No date:  CHOLECYSTECTOMY No date: CHOLECYSTECTOMY, LAPAROSCOPIC 06/16/2020: COLONOSCOPY WITH PROPOFOL; N/A     Comment:  Procedure: COLONOSCOPY WITH PROPOFOL;  Surgeon: [place],               Boykin Nearing, MD;  Location: [facility];  Service:               Gastroenterology;  Laterality: N/A; 02/15/2023: COLONOSCOPY WITH PROPOFOL; N/A     Comment:  Procedure: COLONOSCOPY WITH PROPOFOL;  Surgeon:               Regis Bill, MD;  Location: [facility];                Service: Endoscopy;  Laterality: N/A; 06/16/2020: ESOPHAGOGASTRODUODENOSCOPY (EGD) WITH PROPOFOL; N/A     Comment:  Procedure: ESOPHAGOGASTRODUODENOSCOPY (EGD) WITH               PROPOFOL;  Surgeon: Tol[place], Boykin Nearing, MD;  Location:               [facility];  Service: Gastroenterology;  Laterality:               N/A; 02/15/2023: ESOPHAGOGASTRODUODENOSCOPY (EGD) WITH PROPOFOL; N/A     Comment:  Procedure: ESOPHAGOGASTRODUODENOSCOPY (EGD) WITH               PROPOFOL;  Surgeon: Regis Bill, MD;  Location:  [facility];  Service: Endoscopy;  Laterality: N/A; 09/26/2018: EXOSTECTECTOMY TOE; Right     Comment:  Procedure: EXOSTECTECTOMY TOE-2ND MTPJ;  Surgeon:               Gwyneth Revels, DPM;  Location: [facility];  Service:               Podiatry;  Laterality: Right; 09/16/2023: HIATAL HERNIA REPAIR     Comment:  with gastropexy 07/05/2020: ROBOTIC ASSISTED LAPAROSCOPIC HYSTERECTOMY AND  SALPINGECTOMY; N/A     Comment:  Procedure: XI ROBOTIC ASSISTED TOTAL LAPAROSCOPIC               HYSTERECTOMY AND SALPINGECTOMY, omental adhesion and               bladder and uteralvesicle adhesions, IUD REMOVAL;                Surgeon: Genia Del, MD;  Location: [facility];  Service: Gynecology;  Laterality: N/A;                request 8:30am OR time in [place] Gyn block requests               two hours 09/26/2018: TENOTOMY / FLEXOR TENDON TRANSFER; Right     Comment:   Procedure: FLEXOR TENDON TRANSFER; DEEP;  Surgeon:               Gwyneth Revels, DPM;  Location: [facility];  Service:               Podiatry;  Laterality: Right;  BMI    Body Mass Index: 34.84 kg/m      Reproductive/Obstetrics negative OB ROS                             Anesthesia Physical Anesthesia Plan  ASA: 3  Anesthesia Plan: General   Post-op Pain Management: Minimal or no pain anticipated   Induction: Intravenous  PONV Risk Score and Plan: 2 and Propofol infusion and TIVA  Airway Management Planned: Natural Airway and Nasal Cannula  Additional Equipment:   Intra-op Plan:   Post-operative Plan:   Informed Consent: I have reviewed the patients History and Physical, chart, labs and discussed the procedure including the risks, benefits and alternatives for the proposed anesthesia with the patient or authorized representative who has indicated his/her understanding and acceptance.     Dental Advisory Given  Plan Discussed with: Anesthesiologist, CRNA and Surgeon  Anesthesia Plan Comments: (Patient consented for risks of anesthesia including but not limited to:  - adverse reactions to medications - risk of airway placement if required - damage to eyes, teeth, lips or other oral mucosa - nerve damage due to positioning  - sore throat or hoarseness - Damage to heart, brain, nerves, lungs, other parts of body or loss of life  Patient voiced understanding and assent.)        Anesthesia Quick Evaluation

## 2023-12-06 NOTE — H&P (Signed)
Outpatient short stay form Pre-procedure 12/06/2023  Regis Bill, MD  Primary Physician: Barbette Reichmann, MD  Reason for visit:  Surveillance colonoscopy  History of present illness:    52 y/o lady with history of obesity, hypertension, hypothyroidism, and chronic pain here for EGD/Colonoscopy for abdominal pain and severe constipation which was made worse by a hysterectomy. Last colonoscopy in early 2024 with inadequate prep. No blood thinners. Has also had cholecystectomy and hiatal hernia repair. No family history of GI malignancies.     Current Facility-Administered Medications:    0.9 %  sodium chloride infusion, , Intravenous, Continuous, Harding Thomure, Rossie Muskrat, MD, Last Rate: 20 mL/hr at 12/06/23 0833, New Bag at 12/06/23 0833  Medications Prior to Admission  Medication Sig Dispense Refill Last Dose/Taking   albuterol (VENTOLIN HFA) 108 (90 Base) MCG/ACT inhaler Inhale 1-2 puffs into the lungs every 4 (four) hours as needed for wheezing.   12/06/2023 Morning   amLODipine (NORVASC) 5 MG tablet Take 5 mg by mouth daily.   12/05/2023   ASHWAGANDHA PO Take 200 mg by mouth at bedtime.   Past Week   buPROPion (WELLBUTRIN XL) 300 MG 24 hr tablet Take 300 mg by mouth daily.   12/05/2023   cetirizine (ZYRTEC) 10 MG tablet Take 10 mg by mouth daily.   12/05/2023   Cyanocobalamin (B-12) 1000 MCG/ML KIT Inject 1,000 mcg as directed every 30 (thirty) days.   Past Week   DULoxetine (CYMBALTA) 60 MG capsule Take 1 capsule (60 mg total) by mouth daily. 90 capsule 1 12/05/2023   gabapentin (NEURONTIN) 300 MG capsule 300 mg in the morning, 300 mg in the afternoon, 600 mg nightly 120 capsule 5 12/05/2023   iron polysaccharides (NIFEREX) 150 MG capsule Take 150 mg by mouth at bedtime.   Past Week   levothyroxine (SYNTHROID, LEVOTHROID) 25 MCG tablet Take 25 mcg by mouth daily before breakfast.   12/06/2023 at  6:30 AM   montelukast (SINGULAIR) 10 MG tablet Take 10 mg by mouth at bedtime.    12/05/2023   Multiple Vitamin (MULTIVITAMIN WITH MINERALS) TABS tablet Take 1 tablet by mouth daily at 12 noon.   Past Week   pantoprazole (PROTONIX) 40 MG tablet Take 40 mg by mouth 2 (two) times daily.   12/06/2023 at  6:30 AM   rosuvastatin (CRESTOR) 5 MG tablet Take 5 mg by mouth at bedtime.   12/05/2023   ALPRAZolam (XANAX) 0.25 MG tablet Take 0.25 mg by mouth at bedtime as needed for anxiety or sleep.       azelastine (ASTELIN) 0.1 % nasal spray Place 1 spray into both nostrils daily as needed for allergies.       Bepotastine Besilate (BEPREVE) 1.5 % SOLN Place 1 drop into both eyes in the morning and at bedtime.       diclofenac sodium (VOLTAREN) 1 % GEL Apply 1 application topically daily as needed (pain).       Docusate Sodium (DSS) 100 MG CAPS Take by mouth.      Fluticasone-Salmeterol (WIXELA INHUB IN) Inhale into the lungs.      HYDROcodone-Acetaminophen 5-300 MG TABS Take by mouth daily as needed.      HYDROcodone-Acetaminophen 5-300 MG TABS Take 1 tablet by mouth daily as needed.      NON FORMULARY 500 mg. Friendly Hemp Extra Strength Pain Cream with CBD      polyethylene glycol (MIRALAX / GLYCOLAX) 17 g packet Take 17 g by mouth daily.  promethazine (PHENERGAN) 25 MG tablet Take 25 mg by mouth every 6 (six) hours as needed.      tinidazole (TINDAMAX) 500 MG tablet Take two tabs po BID for two days (Patient not taking: Reported on 10/09/2023) 8 tablet 0    trolamine salicylate (ASPERCREME) 10 % cream Apply 1 application  topically as needed for muscle pain.        Allergies  Allergen Reactions   Beef-Derived Drug Products Swelling   Other     Potato starch - lots of flem   Percocet [Oxycodone-Acetaminophen] Nausea And Vomiting   Pitocin [Oxytocin]     halucinations      Past Medical History:  Diagnosis Date   Anemia    Arthritis    Asthma    Barrett's esophagus    Bursitis    hip, knees, shoulders   Depression    Fibromyalgia    GERD (gastroesophageal  reflux disease)    History of hiatal hernia    Hypothyroidism    Nontoxic uninodular goiter    Pernicious anemia     Review of systems:  Otherwise negative.    Physical Exam  Gen: Alert, oriented. Appears stated age.  HEENT: PERRLA. Lungs: No respiratory distress CV: RRR Abd: soft, benign, no masses Ext: No edema    Planned procedures: Proceed with colonoscopy. The patient understands the nature of the planned procedure, indications, risks, alternatives and potential complications including but not limited to bleeding, infection, perforation, damage to internal organs and possible oversedation/side effects from anesthesia. The patient agrees and gives consent to proceed.  Please refer to procedure notes for findings, recommendations and patient disposition/instructions.     Regis Bill, MD [facility]

## 2023-12-06 NOTE — Op Note (Signed)
[facility] Gastroenterology Patient Name: Valerie Welch Procedure Date: 12/06/2023 8:46 AM MRN: [id number] Account #: [id number] Date of Birth: 1971-03-03 Admit Type: Outpatient Age: 52 Room: [facility] ENDO ROOM 3 Gender: Female Note Status: Finalized Instrument Name: Prentice Docker [id number] Procedure:             Colonoscopy Indications:           Surveillance: History of adenomatous polyps,                         inadequate prep on last exam (<90yr) Providers:             Eather Colas MD, MD Referring MD:          Barbette Reichmann, MD (Referring MD) Medicines:             Monitored Anesthesia Care Complications:         No immediate complications. Procedure:             Pre-Anesthesia Assessment:                        - Prior to the procedure, a History and Physical was                         performed, and patient medications and allergies were                         reviewed. The patient is competent. The risks and                         benefits of the procedure and the sedation options and                         risks were discussed with the patient. All questions                         were answered and informed consent was obtained.                         Patient identification and proposed procedure were                         verified by the physician, the nurse, the                         anesthesiologist, the anesthetist and the technician                         in the endoscopy suite. Mental Status Examination:                         alert and oriented. Airway Examination: normal                         oropharyngeal airway and neck mobility. Respiratory                         Examination: clear to auscultation. CV Examination:  normal. Prophylactic Antibiotics: The patient does not                         require prophylactic antibiotics. Prior                         Anticoagulants: The patient has taken no  anticoagulant                         or antiplatelet agents. ASA Grade Assessment: III - A                         patient with severe systemic disease. After reviewing                         the risks and benefits, the patient was deemed in                         satisfactory condition to undergo the procedure. The                         anesthesia plan was to use monitored anesthesia care                         (MAC). Immediately prior to administration of                         medications, the patient was re-assessed for adequacy                         to receive sedatives. The heart rate, respiratory                         rate, oxygen saturations, blood pressure, adequacy of                         pulmonary ventilation, and response to care were                         monitored throughout the procedure. The physical                         status of the patient was re-assessed after the                         procedure.                        After obtaining informed consent, the colonoscope was                         passed under direct vision. Throughout the procedure,                         the patient's blood pressure, pulse, and oxygen                         saturations were monitored continuously. The  Colonoscope was introduced through the anus and                         advanced to the the terminal ileum. The colonoscopy                         was performed without difficulty. The patient                         tolerated the procedure well. The quality of the bowel                         preparation was good. The terminal ileum, ileocecal                         valve, appendiceal orifice, and rectum were                         photographed. Findings:      The perianal and digital rectal examinations were normal.      The terminal ileum appeared normal.      The entire examined colon appeared normal on direct and retroflexion        views. Impression:            - The examined portion of the ileum was normal.                        - The entire examined colon is normal on direct and                         retroflexion views.                        - No specimens collected. Recommendation:        - Discharge patient to home.                        - Resume previous diet.                        - Continue present medications.                        - Repeat colonoscopy in 10 years for surveillance.                        - Return to referring physician as previously                         scheduled. Procedure Code(s):     --- Professional ---                        W2956, Colorectal cancer screening; colonoscopy on                         individual at high risk Diagnosis Code(s):     --- Professional ---                        Z86.010, Personal history of colonic polyps CPT  copyright 2022 [employer]. All rights reserved. The codes documented in this report are preliminary and upon coder review may  be revised to meet current compliance requirements. Eather Colas MD, MD 12/06/2023 9:24:47 AM Number of Addenda: 0 Note Initiated On: 12/06/2023 8:46 AM Scope Withdrawal Time: 0 hours 8 minutes 45 seconds  Total Procedure Duration: 0 hours 15 minutes 12 seconds  Estimated Blood Loss:  Estimated blood loss: none.      [facility]

## 2023-12-06 NOTE — Interval H&P Note (Signed)
History and Physical Interval Note:  12/06/2023 8:59 AM  Valerie Welch  has presented today for surgery, with the diagnosis of HX OF ADENOMATOUS POLYP OF COLON.  The various methods of treatment have been discussed with the patient and family. After consideration of risks, benefits and other options for treatment, the patient has consented to  Procedure(s): COLONOSCOPY WITH PROPOFOL (N/A) as a surgical intervention.  The patient's history has been reviewed, patient examined, no change in status, stable for surgery.  I have reviewed the patient's chart and labs.  Questions were answered to the patient's satisfaction.     Regis Bill  Ok to proceed with colonoscopy

## 2023-12-09 ENCOUNTER — Encounter: Payer: Self-pay | Admitting: Gastroenterology

## 2023-12-11 ENCOUNTER — Ambulatory Visit
Admission: RE | Admit: 2023-12-11 | Discharge: 2023-12-11 | Disposition: A | Discharge status: Home / Self Care | Payer: BC Managed Care – PPO | Source: Ambulatory Visit | Attending: Student in an Organized Health Care Education/Training Program | Admitting: Student in an Organized Health Care Education/Training Program

## 2023-12-11 ENCOUNTER — Encounter: Payer: Self-pay | Admitting: Student in an Organized Health Care Education/Training Program

## 2023-12-11 ENCOUNTER — Ambulatory Visit
Payer: BC Managed Care – PPO | Attending: Student in an Organized Health Care Education/Training Program | Admitting: Student in an Organized Health Care Education/Training Program

## 2023-12-11 VITALS — BP 120/72 | HR 84 | Temp 96.9°F | Resp 16 | Ht 61.0 in | Wt 182.0 lb

## 2023-12-11 DIAGNOSIS — M47816 Spondylosis without myelopathy or radiculopathy, lumbar region: Secondary | ICD-10-CM | POA: Diagnosis present

## 2023-12-11 MED ORDER — FENTANYL CITRATE (PF) 100 MCG/2ML IJ SOLN
25.0000 ug | INTRAMUSCULAR | Status: DC | PRN
  Administered 2023-12-11: 100 ug via INTRAVENOUS

## 2023-12-11 MED ORDER — ROPIVACAINE HCL 2 MG/ML IJ SOLN
9.0000 mL | Freq: Once | INTRAMUSCULAR | Status: AC
  Administered 2023-12-11: 9 mL via PERINEURAL

## 2023-12-11 MED ORDER — LACTATED RINGERS IV SOLN: Freq: Once | INTRAVENOUS | Status: AC

## 2023-12-11 MED ORDER — DEXAMETHASONE SODIUM PHOSPHATE 10 MG/ML IJ SOLN
INTRAMUSCULAR | Status: AC
  Filled 2023-12-11: qty 2

## 2023-12-11 MED ORDER — IOHEXOL 180 MG/ML  SOLN: 10.0000 mL | Freq: Once | INTRAMUSCULAR | Status: DC

## 2023-12-11 MED ORDER — MIDAZOLAM HCL 5 MG/5ML IJ SOLN
INTRAMUSCULAR | Status: AC
  Filled 2023-12-11: qty 5

## 2023-12-11 MED ORDER — LIDOCAINE HCL 2 % IJ SOLN
20.0000 mL | Freq: Once | INTRAMUSCULAR | Status: AC
  Administered 2023-12-11: 400 mg

## 2023-12-11 MED ORDER — MIDAZOLAM HCL 5 MG/5ML IJ SOLN
0.5000 mg | Freq: Once | INTRAMUSCULAR | Status: AC
  Administered 2023-12-11: 3 mg via INTRAVENOUS

## 2023-12-11 MED ORDER — FENTANYL CITRATE (PF) 100 MCG/2ML IJ SOLN
INTRAMUSCULAR | Status: AC
  Filled 2023-12-11: qty 2

## 2023-12-11 MED ORDER — LIDOCAINE HCL 2 % IJ SOLN
INTRAMUSCULAR | Status: AC
  Filled 2023-12-11: qty 20

## 2023-12-11 MED ORDER — ROPIVACAINE HCL 2 MG/ML IJ SOLN
INTRAMUSCULAR | Status: AC
  Filled 2023-12-11: qty 20

## 2023-12-11 NOTE — Patient Instructions (Signed)

## 2023-12-11 NOTE — Progress Notes (Signed)
Safety precautions to be maintained throughout the outpatient stay will include: orient to surroundings, keep bed in low position, maintain call bell within reach at all times, provide assistance with transfer out of bed and ambulation.  

## 2023-12-11 NOTE — Progress Notes (Signed)
PROVIDER NOTE: Interpretation of information contained herein should be left to medically-trained personnel. Specific patient instructions are provided elsewhere under "Patient Instructions" section of medical record. This document was created in part using STT-dictation technology, any transcriptional errors that may result from this process are unintentional.  Patient: Valerie Welch Type: Established DOB: [birth year] MRN: [id number] PCP: Barbette Reichmann, MD  Service: Procedure DOS: 12/11/2023 Setting: Ambulatory Location: Ambulatory outpatient facility Delivery: Face-to-face Provider: Edward Jolly, MD Specialty: Interventional Pain Management Specialty designation: 09 Location: Outpatient facility Ref. Prov.: Barbette Reichmann, MD       Interventional Therapy   Procedure: Lumbar Facet, Medial Branch Radiofrequency Ablation (RFA) #2  Laterality: Bilateral (-50)  Level: L3, L4, and L5 Medial Branch Level(s). These levels will denervate the L3-4 and L4-5 lumbar facet joints.  Imaging: Fluoroscopy-guided         Anesthesia: Local anesthesia (1-2% Lidocaine) Moderate Sedation DOS: 12/11/2023  Performed by: Edward Jolly, MD  Purpose: Therapeutic/Palliative Indications: Low back pain severe enough to impact quality of life or function. Indications: 1. Lumbar facet arthropathy    Valerie Welch has been dealing with the above chronic pain for longer than three months and has either failed to respond, was unable to tolerate, or simply did not get enough benefit from other more conservative therapies including, but not limited to: 1. Over-the-counter medications 2. Anti-inflammatory medications 3. Muscle relaxants 4. Membrane stabilizers 5. Opioids 6. Physical therapy and/or chiropractic manipulation 7. Modalities (Heat, ice, etc.) 8. Invasive techniques such as nerve blocks. Valerie Welch has attained more than 50% relief of the pain from a series of diagnostic injections  conducted in separate occasions.  Pain Score: Pre-procedure: 7 /10 Post-procedure: 0-No pain/10     Position / Prep / Materials:  Position: Prone  Prep solution: DuraPrep (Iodine Povacrylex [0.7% available iodine] and Isopropyl Alcohol, 74% w/w) Prep Area: Entire Lumbosacral Region (Lower back from mid-thoracic region to end of tailbone and from flank to flank.) Materials:  Tray: RFA (Radiofrequency) tray Needle(s):  Type: RFA (Teflon-coated radiofrequency ablation needles) Gauge (G): 22  Length: Regular (10cm) Qty: 3      Pre-op H&P Assessment:  Valerie Welch is a 52 y.o. (year old), female patient, seen today for interventional treatment. She  has a past surgical history that includes Cesarean section; Cholecystectomy, laparoscopic; Ankle arthroscopy (Right, 09/26/2018); Tenotomy / flexor tendon transfer (Right, 09/26/2018); Arthrodesis metatarsalphalangeal joint (mtpj) (Right, 09/26/2018); Exostectectomy toe (Right, 09/26/2018); Cholecystectomy; Colonoscopy with propofol (N/A, 06/16/2020); Esophagogastroduodenoscopy (egd) with propofol (N/A, 06/16/2020); Robotic assisted laparoscopic hysterectomy and salpingectomy (N/A, 07/05/2020); Colonoscopy with propofol (N/A, 02/15/2023); Esophagogastroduodenoscopy (egd) with propofol (N/A, 02/15/2023); Hiatal hernia repair (09/16/2023); and Colonoscopy with propofol (N/A, 12/06/2023). Valerie Welch has a current medication list which includes the following prescription(s): albuterol, alprazolam, amlodipine, ashwagandha, azelastine, bepreve, bupropion, cetirizine, b-12, diclofenac sodium, dss, duloxetine, fluticasone-salmeterol, gabapentin, hydrocodone-acetaminophen, hydrocodone-acetaminophen, iron polysaccharides, levothyroxine, metronidazole, montelukast, multivitamin with minerals, NON FORMULARY, pantoprazole, polyethylene glycol, promethazine, rosuvastatin, tinidazole, trolamine salicylate, ascorbic acid, and estradiol, and the following  Facility-Administered Medications: fentanyl and iohexol. Her primarily concern today is the Back Pain (Lower back, right knee)  Initial Vital Signs:  Pulse/HCG Rate: 84ECG Heart Rate: 86 (NSR) Temp: (!) 96.9 F (36.1 C) Resp: 15 BP: (!) 141/90 SpO2: 100 %  BMI: Estimated body mass index is 34.39 kg/m as calculated from the following:   Height as of this encounter: 5\' 1"  (1.549 m).   Weight as of this encounter: 182 lb (82.6 kg).  Risk Assessment: Allergies: Reviewed. She is allergic  to beef-derived drug products, other, percocet [oxycodone-acetaminophen], and pitocin [oxytocin].  Allergy Precautions: None required Coagulopathies: Reviewed. None identified.  Blood-thinner therapy: None at this time Active Infection(s): Reviewed. None identified. Valerie Welch is afebrile  Site Confirmation: Valerie Welch was asked to confirm the procedure and laterality before marking the site Procedure checklist: Completed Consent: Before the procedure and under the influence of no sedative(s), amnesic(s), or anxiolytics, the patient was informed of the treatment options, risks and possible complications. To fulfill our ethical and legal obligations, as recommended by the American Medical Association's Code of Ethics, I have informed the patient of my clinical impression; the nature and purpose of the treatment or procedure; the risks, benefits, and possible complications of the intervention; the alternatives, including doing nothing; the risk(s) and benefit(s) of the alternative treatment(s) or procedure(s); and the risk(s) and benefit(s) of doing nothing. The patient was provided information about the general risks and possible complications associated with the procedure. These may include, but are not limited to: failure to achieve desired goals, infection, bleeding, organ or nerve damage, allergic reactions, paralysis, and death. In addition, the patient was informed of those risks and  complications associated to Spine-related procedures, such as failure to decrease pain; infection (i.e.: Meningitis, epidural or intraspinal abscess); bleeding (i.e.: epidural hematoma, subarachnoid hemorrhage, or any other type of intraspinal or peri-dural bleeding); organ or nerve damage (i.e.: Any type of peripheral nerve, nerve root, or spinal cord injury) with subsequent damage to sensory, motor, and/or autonomic systems, resulting in permanent pain, numbness, and/or weakness of one or several areas of the body; allergic reactions; (i.e.: anaphylactic reaction); and/or death. Furthermore, the patient was informed of those risks and complications associated with the medications. These include, but are not limited to: allergic reactions (i.e.: anaphylactic or anaphylactoid reaction(s)); adrenal axis suppression; blood sugar elevation that in diabetics may result in ketoacidosis or comma; water retention that in patients with history of congestive heart failure may result in shortness of breath, pulmonary edema, and decompensation with resultant heart failure; weight gain; swelling or edema; medication-induced neural toxicity; particulate matter embolism and blood vessel occlusion with resultant organ, and/or nervous system infarction; and/or aseptic necrosis of one or more joints. Finally, the patient was informed that Medicine is not an exact science; therefore, there is also the possibility of unforeseen or unpredictable risks and/or possible complications that may result in a catastrophic outcome. The patient indicated having understood very clearly. We have given the patient no guarantees and we have made no promises. Enough time was given to the patient to ask questions, all of which were answered to the patient's satisfaction. Valerie Welch has indicated that she wanted to continue with the procedure. Attestation: I, the ordering provider, attest that I have discussed with the patient the benefits,  risks, side-effects, alternatives, likelihood of achieving goals, and potential problems during recovery for the procedure that I have provided informed consent. Date  Time: 12/11/2023  8:08 AM   Pre-Procedure Preparation:  Monitoring: As per clinic protocol. Respiration, ETCO2, SpO2, BP, heart rate and rhythm monitor placed and checked for adequate function Safety Precautions: Patient was assessed for positional comfort and pressure points before starting the procedure. Time-out: I initiated and conducted the "Time-out" before starting the procedure, as per protocol. The patient was asked to participate by confirming the accuracy of the "Time Out" information. Verification of the correct person, site, and procedure were performed and confirmed by me, the nursing staff, and the patient. "Time-out" conducted as per Joint Commission's  Universal Protocol (UP.01.01.01). Time: 0931 Start Time: 0931 hrs.  Description of Procedure:          Laterality: See above. Levels:  See above. Safety Precautions: Aspiration looking for blood return was conducted prior to all injections. At no point did we inject any substances, as a needle was being advanced. Before injecting, the patient was told to immediately notify me if she was experiencing any new onset of "ringing in the ears, or metallic taste in the mouth". No attempts were made at seeking any paresthesias. Safe injection practices and needle disposal techniques used. Medications properly checked for expiration dates. SDV (single dose vial) medications used. After the completion of the procedure, all disposable equipment used was discarded in the proper designated medical waste containers. Local Anesthesia: Protocol guidelines were followed. The patient was positioned over the fluoroscopy table. The area was prepped in the usual manner. The time-out was completed. The target area was identified using fluoroscopy. A 12-in long, straight, sterile hemostat was  used with fluoroscopic guidance to locate the targets for each level blocked. Once located, the skin was marked with an approved surgical skin marker. Once all sites were marked, the skin (epidermis, dermis, and hypodermis), as well as deeper tissues (fat, connective tissue and muscle) were infiltrated with a small amount of a short-acting local anesthetic, loaded on a 10cc syringe with a 25G, 1.5-in  Needle. An appropriate amount of time was allowed for local anesthetics to take effect before proceeding to the next step. Technical description of process:   Radiofrequency Ablation (RFA) L3 Medial Branch Nerve RFA: The target area for the L3 medial branch is at the junction of the postero-lateral aspect of the superior articular process and the superior, posterior, and medial edge of the transverse process of L4. Under fluoroscopic guidance, a Radiofrequency needle was inserted until contact was made with os over the superior postero-lateral aspect of the pedicular shadow (target area). Sensory and motor testing was conducted to properly adjust the position of the needle. Once satisfactory placement of the needle was achieved, the numbing solution was slowly injected after negative aspiration for blood. 2.0 mL of the nerve block solution was injected without difficulty or complication. After waiting for at least 3 minutes, the ablation was performed. Once completed, the needle was removed intact. L4 Medial Branch Nerve RFA: The target area for the L4 medial branch is at the junction of the postero-lateral aspect of the superior articular process and the superior, posterior, and medial edge of the transverse process of L5. Under fluoroscopic guidance, a Radiofrequency needle was inserted until contact was made with os over the superior postero-lateral aspect of the pedicular shadow (target area). Sensory and motor testing was conducted to properly adjust the position of the needle. Once satisfactory placement of  the needle was achieved, the numbing solution was slowly injected after negative aspiration for blood. 2.0 mL of the nerve block solution was injected without difficulty or complication. After waiting for at least 3 minutes, the ablation was performed. Once completed, the needle was removed intact. L5 Medial Branch Nerve RFA: The target area for the L5 medial branch is at the junction of the postero-lateral aspect of the superior articular process of S1 and the superior, posterior, and medial edge of the sacral ala. Under fluoroscopic guidance, a Radiofrequency needle was inserted until contact was made with os over the superior postero-lateral aspect of the pedicular shadow (target area). Sensory and motor testing was conducted to properly adjust the position of  the needle. Once satisfactory placement of the needle was achieved, the numbing solution was slowly injected after negative aspiration for blood. 2.0 mL of the nerve block solution was injected without difficulty or complication. After waiting for at least 3 minutes, the ablation was performed. Once completed, the needle was removed intact.  Radiofrequency lesioning (ablation):  Radiofrequency Generator: Medtronic AccurianTM AG 1000 RF Generator Sensory Stimulation Parameters: 50 Hz was used to locate & identify the nerve, making sure that the needle was positioned such that there was no sensory stimulation below 0.3 V or above 0.7 V. Motor Stimulation Parameters: 2 Hz was used to evaluate the motor component. Care was taken not to lesion any nerves that demonstrated motor stimulation of the lower extremities at an output of less than 2.5 times that of the sensory threshold, or a maximum of 2.0 V. Lesioning Technique Parameters: Standard Radiofrequency settings. (Not bipolar or pulsed.) Temperature Settings: 80 degrees C Lesioning time: 60 seconds Intra-operative Compliance: Compliant  Once the entire procedure was completed, the treated area  was cleaned, making sure to leave some of the prepping solution back to take advantage of its long term bactericidal properties.    Illustration of the posterior view of the lumbar spine and the posterior neural structures. Laminae of L2 through S1 are labeled. DPRL5, dorsal primary ramus of L5; DPRS1, dorsal primary ramus of S1; DPR3, dorsal primary ramus of L3; FJ, facet (zygapophyseal) joint L3-L4; I, inferior articular process of L4; LB1, lateral branch of dorsal primary ramus of L1; IAB, inferior articular branches from L3 medial branch (supplies L4-L5 facet joint); IBP, intermediate branch plexus; MB3, medial branch of dorsal primary ramus of L3; NR3, third lumbar nerve root; S, superior articular process of L5; SAB, superior articular branches from L4 (supplies L4-5 facet joint also); TP3, transverse process of L3.  Facet Joint Innervation (* possible contribution)  L1-2 T12, L1 (L2*)  Medial Branch  L2-3 L1, L2 (L3*)         "          "  L3-4 L2, L3 (L4*)         "          "  L4-5 L3, L4 (L5*)         "          "  L5-S1 L4, L5, S1          "          "    Vitals:   12/11/23 1000 12/11/23 1009 12/11/23 1019 12/11/23 1029  BP: (!) 133/97 131/88 113/68 120/72  Pulse:      Resp: 18 16 16 16   Temp:      SpO2: 100% 96% 100% 99%  Weight:      Height:        Start Time: 0931 hrs. End Time: 1000 hrs.  Imaging Guidance (Spinal):          Type of Imaging Technique: Fluoroscopy Guidance (Spinal) Indication(s): Assistance in needle guidance and placement for procedures requiring needle placement in or near specific anatomical locations not easily accessible without such assistance. Exposure Time: Please see nurses notes. Contrast: None used. Fluoroscopic Guidance: I was personally present during the use of fluoroscopy. "Tunnel Vision Technique" used to obtain the best possible view of the target area. Parallax error corrected before commencing the procedure. "Direction-depth-direction"  technique used to introduce the needle under continuous pulsed fluoroscopy. Once target was reached, antero-posterior, oblique, and lateral fluoroscopic  projection used confirm needle placement in all planes. Images permanently stored in EMR. Interpretation: No contrast injected. I personally interpreted the imaging intraoperatively. Adequate needle placement confirmed in multiple planes. Permanent images saved into the patient's record.  Antibiotic Prophylaxis:   Anti-infectives (From admission, onward)    None      Indication(s): None identified  Post-operative Assessment:  Post-procedure Vital Signs:  Pulse/HCG Rate: 8468 Temp: (!) 96.9 F (36.1 C) Resp: 16 BP: 120/72 SpO2: 99 %  EBL: None  Complications: No immediate post-treatment complications observed by team, or reported by patient.  Note: The patient tolerated the entire procedure well. A repeat set of vitals were taken after the procedure and the patient was kept under observation following institutional policy, for this type of procedure. Post-procedural neurological assessment was performed, showing return to baseline, prior to discharge. The patient was provided with post-procedure discharge instructions, including a section on how to identify potential problems. Should any problems arise concerning this procedure, the patient was given instructions to immediately contact us, at any time, without hesitation. In any case, we plan to contact the patient by telephone for a follow-up status report regarding this interventional procedure.  Comments:  No additional relevant information.  Plan of Care (POC)  Orders:  Orders Placed This Encounter  Procedures   DG PAIN CLINIC C-ARM 1-60 MIN NO REPORT    Intraoperative interpretation by procedural physician at [facility].    Standing Status:   Standing    Number of Occurrences:   1    Reason for exam::   Assistance in needle guidance and placement for procedures  requiring needle placement in or near specific anatomical locations not easily accessible without such assistance.     Medications ordered for procedure: Meds ordered this encounter  Medications   iohexol (OMNIPAQUE) 180 MG/ML injection 10 mL    Must be Myelogram-compatible. If not available, you may substitute with a water-soluble, non-ionic, hypoallergenic, myelogram-compatible radiological contrast medium.   lidocaine (XYLOCAINE) 2 % (with pres) injection 400 mg   lactated ringers infusion   midazolam (VERSED) 5 MG/5ML injection 0.5-2 mg    Make sure Flumazenil is available in the pyxis when using this medication. If oversedation occurs, administer 0.2 mg IV over 15 sec. If after 45 sec no response, administer 0.2 mg again over 1 min; may repeat at 1 min intervals; not to exceed 4 doses (1 mg)   fentaNYL (SUBLIMAZE) injection 25-50 mcg    Make sure Narcan is available in the pyxis when using this medication. In the event of respiratory depression (RR< 8/min): Titrate NARCAN (naloxone) in increments of 0.1 to 0.2 mg IV at 2-3 minute intervals, until desired degree of reversal.   ropivacaine (PF) 2 mg/mL (0.2%) (NAROPIN) injection 9 mL   ropivacaine (PF) 2 mg/mL (0.2%) (NAROPIN) injection 9 mL   Medications administered: We administered lidocaine, lactated ringers, midazolam, fentaNYL, ropivacaine (PF) 2 mg/mL (0.2%), and ropivacaine (PF) 2 mg/mL (0.2%).  See the medical record for exact dosing, route, and time of administration.  Follow-up plan:   Return in about 3 months (around 03/10/2024) for PPE, F2F.       Recent Visits Date Type Provider Dept  10/09/23 Procedure visit Edward Jolly, MD [facility]  09/24/23 Office Visit Edward Jolly, MD [facility]  Showing recent visits within past 90 days and meeting all other requirements Today's Visits Date Type Provider Dept  12/11/23 Procedure visit Edward Jolly, MD [facility]  Showing today's  visits  and meeting all other requirements Future Appointments No visits were found meeting these conditions. Showing future appointments within next 90 days and meeting all other requirements  Disposition: Discharge home  Discharge (Date  Time): 12/11/2023; 1030 hrs.   Primary Care Physician: Barbette Reichmann, MD Location: [facility] Note by: Edward Jolly, MD (TTS technology used. I apologize for any typographical errors that were not detected and corrected.) Date: 12/11/2023; Time: 10:34 AM  Disclaimer:  Medicine is not an Visual merchandiser. The only guarantee in medicine is that nothing is guaranteed. It is important to note that the decision to proceed with this intervention was based on the information collected from the patient. The Data and conclusions were drawn from the patient's questionnaire, the interview, and the physical examination. Because the information was provided in large part by the patient, it cannot be guaranteed that it has not been purposely or unconsciously manipulated. Every effort has been made to obtain as much relevant data as possible for this evaluation. It is important to note that the conclusions that lead to this procedure are derived in large part from the available data. Always take into account that the treatment will also be dependent on availability of resources and existing treatment guidelines, considered by other Pain Management Practitioners as being common knowledge and practice, at the time of the intervention. For Medico-Legal purposes, it is also important to point out that variation in procedural techniques and pharmacological choices are the acceptable norm. The indications, contraindications, technique, and results of the above procedure should only be interpreted and judged by a Board-Certified Interventional Pain Specialist with extensive familiarity and expertise in the same exact procedure and technique.

## 2023-12-30 ENCOUNTER — Other Ambulatory Visit: Payer: Self-pay | Admitting: Student in an Organized Health Care Education/Training Program

## 2023-12-30 DIAGNOSIS — G5771 Causalgia of right lower limb: Secondary | ICD-10-CM

## 2023-12-30 DIAGNOSIS — M5416 Radiculopathy, lumbar region: Secondary | ICD-10-CM

## 2023-12-30 DIAGNOSIS — G5793 Unspecified mononeuropathy of bilateral lower limbs: Secondary | ICD-10-CM

## 2023-12-30 DIAGNOSIS — G894 Chronic pain syndrome: Secondary | ICD-10-CM

## 2023-12-30 DIAGNOSIS — M792 Neuralgia and neuritis, unspecified: Secondary | ICD-10-CM

## 2024-01-01 ENCOUNTER — Other Ambulatory Visit: Payer: Self-pay | Admitting: *Deleted

## 2024-01-01 ENCOUNTER — Telehealth: Payer: Self-pay | Admitting: Student in an Organized Health Care Education/Training Program

## 2024-01-01 DIAGNOSIS — G5771 Causalgia of right lower limb: Secondary | ICD-10-CM

## 2024-01-01 DIAGNOSIS — M792 Neuralgia and neuritis, unspecified: Secondary | ICD-10-CM

## 2024-01-01 DIAGNOSIS — G894 Chronic pain syndrome: Secondary | ICD-10-CM

## 2024-01-01 DIAGNOSIS — G5793 Unspecified mononeuropathy of bilateral lower limbs: Secondary | ICD-10-CM

## 2024-01-01 DIAGNOSIS — M5416 Radiculopathy, lumbar region: Secondary | ICD-10-CM

## 2024-01-01 NOTE — Telephone Encounter (Signed)
 PT stated that she need a refill on her Gabapentin. Please give patient a call. TY

## 2024-01-01 NOTE — Telephone Encounter (Signed)
Rx request sent to Dr. Lateef 

## 2024-01-02 MED ORDER — GABAPENTIN 300 MG PO CAPS: ORAL_CAPSULE | ORAL | 5 refills | Status: DC

## 2024-01-06 IMAGING — MG MM DIGITAL DIAGNOSTIC UNILAT*R* W/ TOMO W/ CAD
4 series · 4 of 12 positions shown · non-contrast
Comparison: Previous exam(s).

CLINICAL DATA: 50-year-old female recalled from screening mammogram
dated 02/21/2022 for a possible right breast asymmetry.

EXAM:
DIGITAL DIAGNOSTIC UNILATERAL RIGHT MAMMOGRAM WITH TOMOSYNTHESIS AND
CAD
TECHNIQUE: Right digital diagnostic mammography and breast tomosynthesis was
performed. The images were evaluated with computer-aided detection.

[R CC synth-2D]
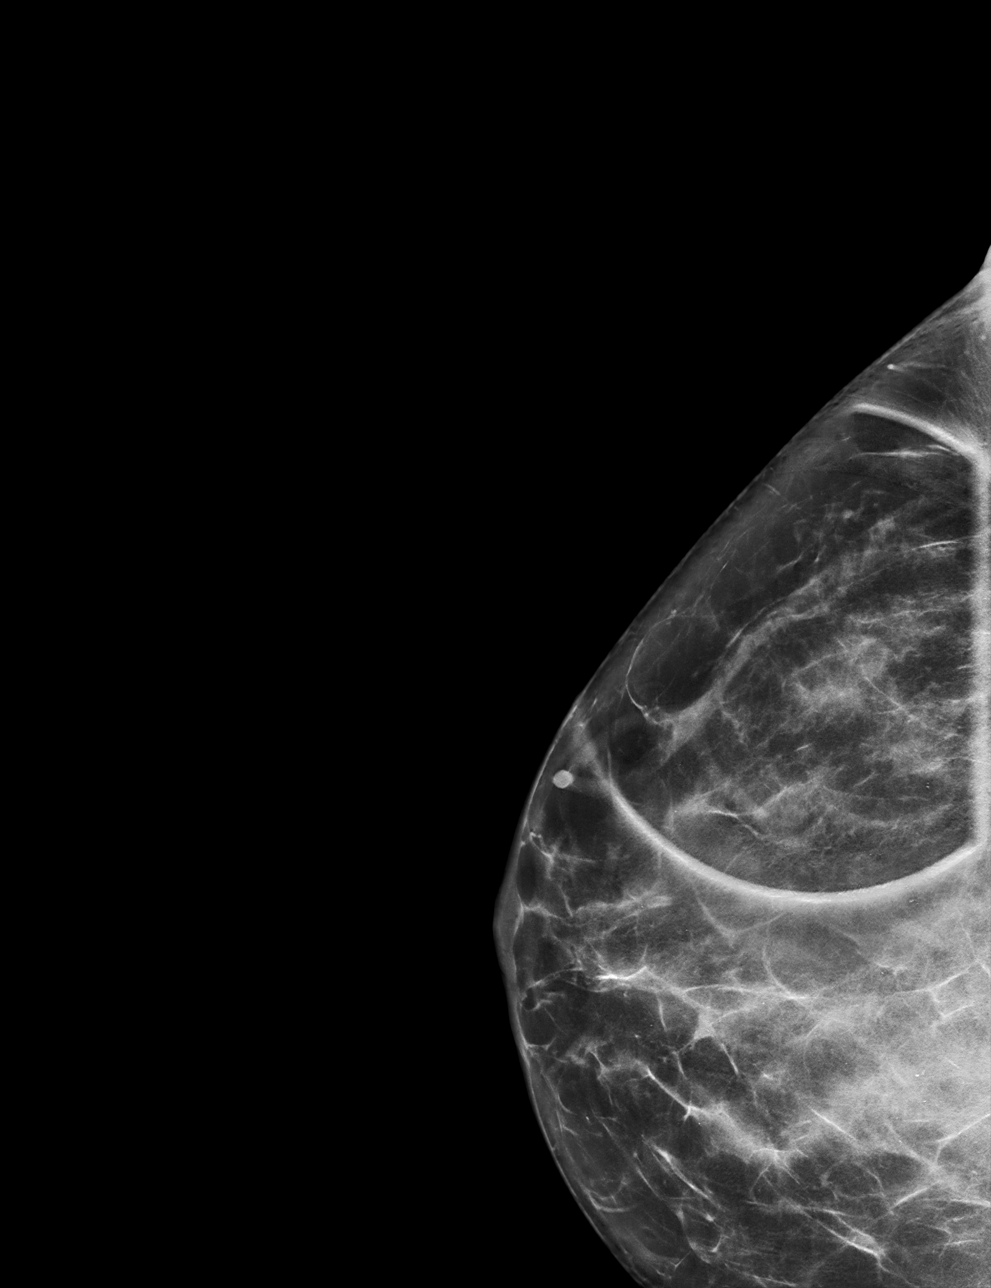

[R ML synth-2D]
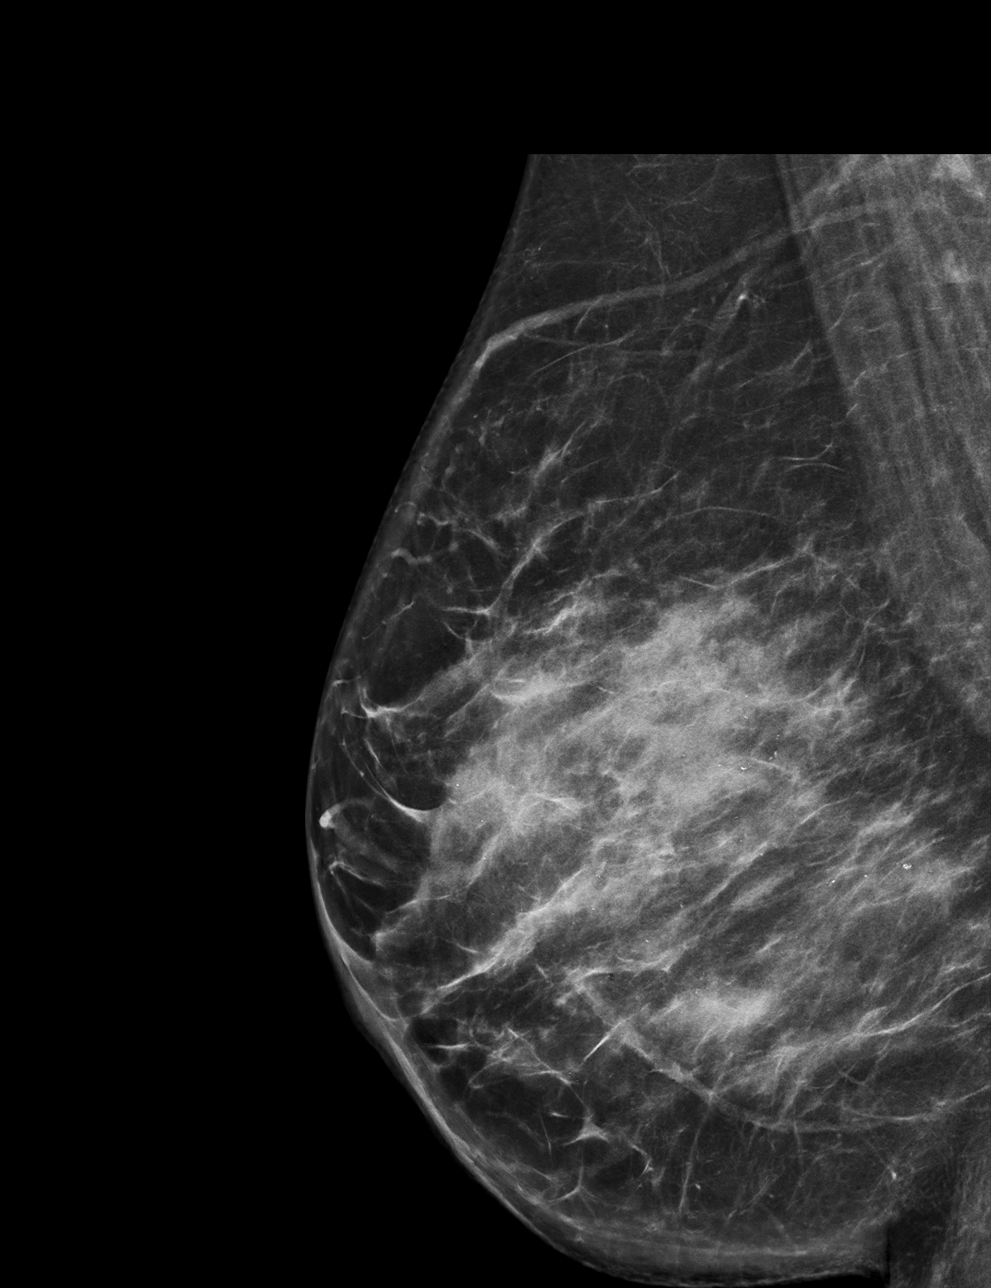

[R CC tomo · tomo slice 37/72.0]
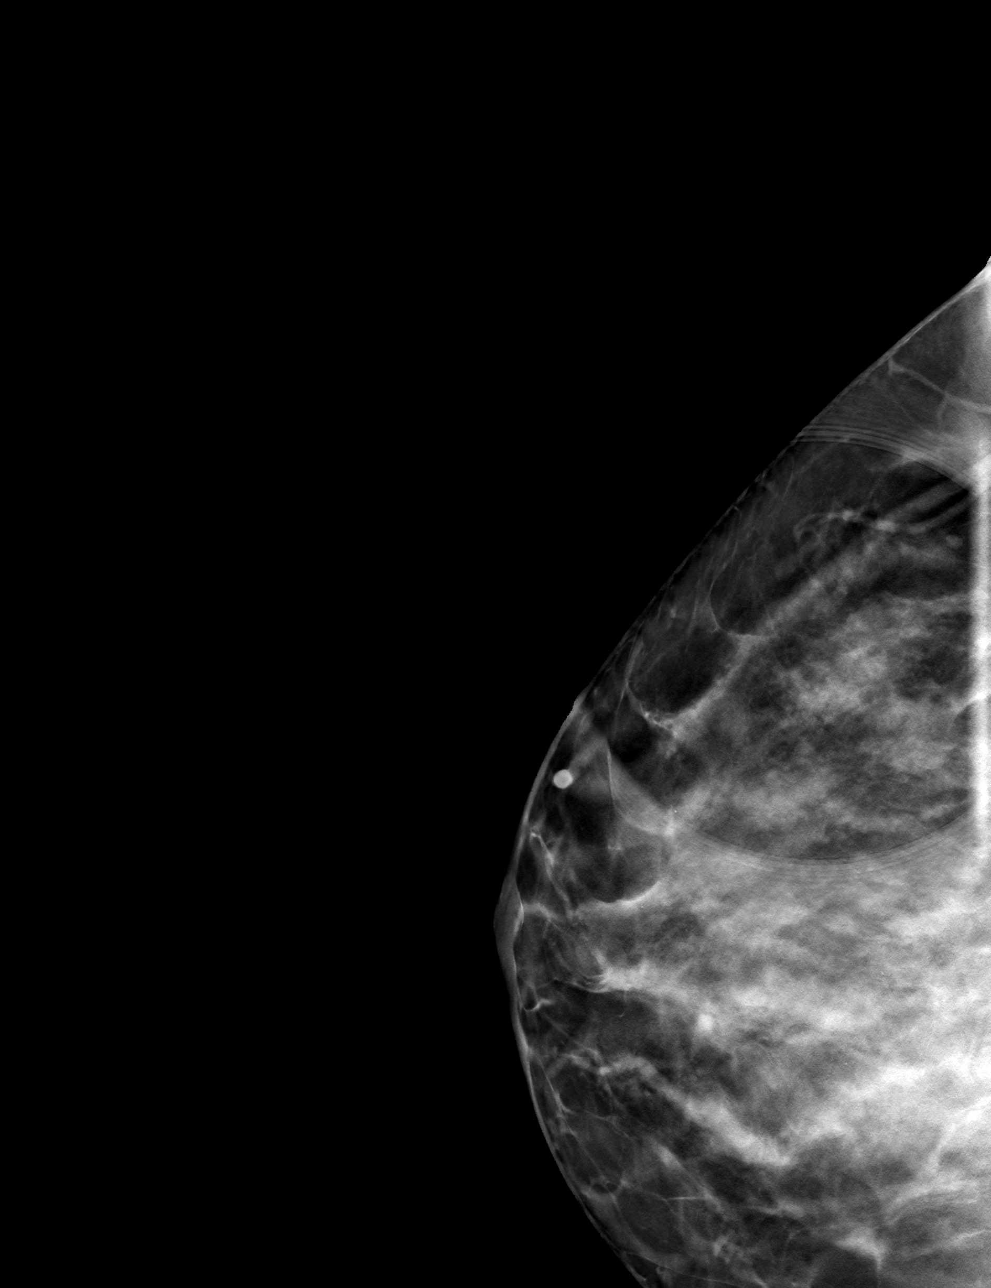

[R ML tomo · tomo slice 41/80.0]
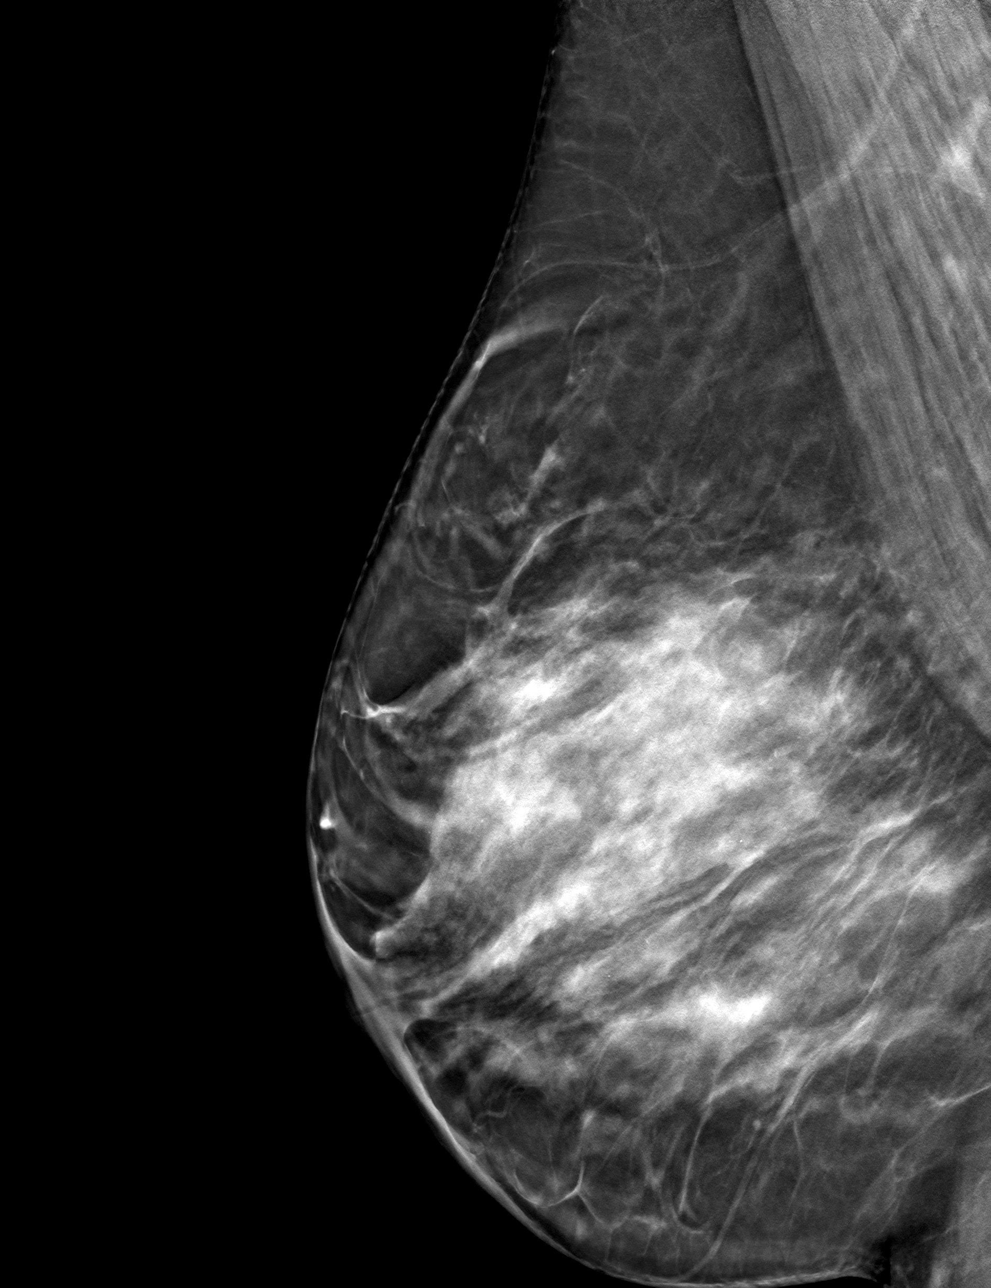

[4 of 12 positions shown; findings below may reference images not displayed]

ACR Breast Density Category c: The breast tissue is heterogeneously
dense, which may obscure small masses.
FINDINGS: Previously described, possible asymmetry in the lateral right breast
at mid depth is seen on the cc projection resolves into well
dispersed fibroglandular tissue on additional views. No suspicious
findings are identified.
IMPRESSION: No mammographic evidence of malignancy.

RECOMMENDATION:
Screening mammogram in one year.(Code:[id number])

I have discussed the findings and recommendations with the patient.
If applicable, a reminder letter will be sent to the patient
regarding the next appointment.

BI-RADS CATEGORY  1: Negative.

## 2024-02-14 ENCOUNTER — Other Ambulatory Visit: Payer: Self-pay | Admitting: Internal Medicine

## 2024-02-14 DIAGNOSIS — Z1231 Encounter for screening mammogram for malignant neoplasm of breast: Secondary | ICD-10-CM

## 2024-02-25 ENCOUNTER — Encounter: Payer: Self-pay | Admitting: Student in an Organized Health Care Education/Training Program

## 2024-02-25 ENCOUNTER — Ambulatory Visit
Payer: BC Managed Care – PPO | Attending: Student in an Organized Health Care Education/Training Program | Admitting: Student in an Organized Health Care Education/Training Program

## 2024-02-25 VITALS — BP 138/89 | HR 84 | Temp 97.2°F | Resp 16 | Ht 61.0 in | Wt 185.0 lb

## 2024-02-25 DIAGNOSIS — M5416 Radiculopathy, lumbar region: Secondary | ICD-10-CM | POA: Insufficient documentation

## 2024-02-25 DIAGNOSIS — M4726 Other spondylosis with radiculopathy, lumbar region: Secondary | ICD-10-CM

## 2024-02-25 DIAGNOSIS — G5793 Unspecified mononeuropathy of bilateral lower limbs: Secondary | ICD-10-CM | POA: Insufficient documentation

## 2024-02-25 DIAGNOSIS — M47816 Spondylosis without myelopathy or radiculopathy, lumbar region: Secondary | ICD-10-CM | POA: Insufficient documentation

## 2024-02-25 NOTE — Progress Notes (Signed)
 PROVIDER NOTE: Information contained herein reflects review and annotations entered in association with encounter. Interpretation of such information and data should be left to medically-trained personnel. Information provided to patient can be located elsewhere in the medical record under "Patient Instructions". Document created using STT-dictation technology, any transcriptional errors that may result from process are unintentional.    Patient: Valerie Welch  Service Category: E/M  Provider: Edward Jolly, MD  DOB: 12/30/1970  DOS: 02/25/2024  Referring Provider: Barbette Reichmann, MD  MRN: [id number]  Specialty: Interventional Pain Management  PCP: Barbette Reichmann, MD  Type: Established Patient  Setting: Ambulatory outpatient    Location: Office  Delivery: Face-to-face     HPI  [name], a 53 y.o. year old female, is here today because of her Lumbar radiculopathy [M54.16]. Valerie Welch primary complain today is Other (Reports that she feels like her entire right side is on fire. ), Knee Pain (Left ), Foot Pain (Left ), and Shoulder Pain (Left )  Pertinent problems: Valerie Welch has History of foot surgery; Generalized anxiety disorder; Neuropathic pain of both legs; Complex regional pain syndrome type II of right lower limb; and Neuropathic pain of right foot on their pertinent problem list. Pain Assessment: Severity of Chronic pain is reported as a 7 /10. Location: Back (see visit info for all additional pain sites.) Lower, Right/up and down the right side of the body down the leg and to the foot.. Onset: More than a month ago. Quality: Discomfort, Burning, Constant. Timing: Constant. Modifying factor(s): nothing currently. Vitals:  height is 5\' 1"  (1.549 m) and weight is 185 lb (83.9 kg). Her temporal temperature is 97.2 F (36.2 C) (abnormal). Her blood pressure is 138/89 and her pulse is 84. Her respiration is 16 and oxygen saturation is 98%.  BMI: Estimated body  mass index is 34.96 kg/m as calculated from the following:   Height as of this encounter: 5\' 1"  (1.549 m).   Weight as of this encounter: 185 lb (83.9 kg). Last encounter: 09/24/2023. Last procedure: 12/11/2023.  Reason for encounter: patient-requested evaluation.  Discussed the use of AI scribe software for clinical note transcription with the patient, who gave verbal consent to proceed.  History of Present Illness   Valerie Welch is a 53 year old female with chronic low back pain who presents with increased radiating pain.  Since mid-February 2025, she has experienced a significant increase in her chronic low back pain, described as a 'raging fire' in the lower lumbar region, which is the worst she has ever felt. Initially localized, the pain now radiates from the hip to the leg, particularly affecting the right side from the hip to the top of the leg and thigh.  The exacerbation has also intensified pre-existing issues on the left side, causing increased pain from the left foot, ankle, knee, and shoulder. She describes the sensation as having 'divots' and 'knots' when touched, which has been driving her 'crazy'. The radiating pain, which she associates with sciatica, has worsened since her last epidural in October 2024, which targeted the right side. Her previous L-ESI on 10/09/23 provided 75% pain relief for approx 4 months but now the pain has returned and is radiating to bilateral legs, not just the right.  Her pain management includes Neurontin, taken twice a day, but she is unable to increase the dose due to work commitments. She takes her medication at 2 PM to manage her energy levels and pain throughout the day. She also started  taking magnesium twice a day and her regular vitamins at noon.  She has tried various non-pharmacological interventions such as baths, cold compresses, and creams without significant relief. Swelling has been particularly noted by her daughter, and she  describes feeling 'like a combustible'.  Her work setup has been adjusted to reduce strain, but she feels the pain has gone into 'overdrive'. She experiences significant discomfort by the afternoon, impacting her ability to perform daily activities.       Constitutional: Denies any fever or chills Gastrointestinal: No reported hemesis, hematochezia, vomiting, or acute GI distress Musculoskeletal:  LBP with radiation to bilateral legs in dermatomal fashion Neurological: No reported episodes of acute onset apraxia, aphasia, dysarthria, agnosia, amnesia, paralysis, loss of coordination, or loss of consciousness  Medication Review  ALPRAZolam, Ashwagandha, B-12, Bepotastine Besilate, DSS, DULoxetine, Fluticasone-Salmeterol, HYDROcodone-Acetaminophen, NON FORMULARY, albuterol, amLODipine, ascorbic acid, azelastine, buPROPion, cetirizine, diclofenac sodium, estradiol, gabapentin, iron polysaccharides, levothyroxine, montelukast, multivitamin with minerals, pantoprazole, polyethylene glycol, promethazine, rosuvastatin, tinidazole, and trolamine salicylate  History Review  Allergy: Valerie Welch is allergic to beef-derived drug products, other, percocet [oxycodone-acetaminophen], and pitocin [oxytocin]. [name]  reports no history of drug use. Alcohol:  reports current alcohol use. Tobacco:  reports that she has never smoked. She has never used smokeless tobacco. [name]  reports that she has never smoked. She has never used smokeless tobacco. She reports current alcohol use. She reports that she does not use drugs. Medical:  has a past medical history of Anemia, Arthritis, Asthma, Barrett's esophagus, Bursitis, Depression, Fibromyalgia, GERD (gastroesophageal reflux disease), History of hiatal hernia, Hypothyroidism, Nontoxic uninodular goiter, and Pernicious anemia. Surgical: Valerie Welch  has a past surgical history that includes Cesarean section;  Cholecystectomy, laparoscopic; Ankle arthroscopy (Right, 09/26/2018); Tenotomy / flexor tendon transfer (Right, 09/26/2018); Arthrodesis metatarsalphalangeal joint (mtpj) (Right, 09/26/2018); Exostectectomy toe (Right, 09/26/2018); Cholecystectomy; Colonoscopy with propofol (N/A, 06/16/2020); Esophagogastroduodenoscopy (egd) with propofol (N/A, 06/16/2020); Robotic assisted laparoscopic hysterectomy and salpingectomy (N/A, 07/05/2020); Colonoscopy with propofol (N/A, 02/15/2023); Esophagogastroduodenoscopy (egd) with propofol (N/A, 02/15/2023); Hiatal hernia repair (09/16/2023); and Colonoscopy with propofol (N/A, 12/06/2023). Family: family history includes Cancer in her paternal grandfather; Diabetes in her mother; Heart disease in her mother; Hypertension in her maternal grandfather; Stroke in her brother, father, and mother.  Laboratory Chemistry Profile   Renal Lab Results  Component Value Date   BUN 7 07/01/2020   CREATININE 1.00 07/01/2020   GFRAA >60 07/01/2020   GFRNONAA >60 07/01/2020    Hepatic No results found for: "AST", "ALT", "ALBUMIN", "ALKPHOS", "HCVAB", "AMYLASE", "LIPASE", "AMMONIA"  Electrolytes Lab Results  Component Value Date   NA 139 07/01/2020   K 4.5 07/01/2020   CL 108 07/01/2020   CALCIUM 9.2 07/01/2020    Bone No results found for: "VD25OH", "VD125OH2TOT", "QI6962XB2", "WU1324MW1", "25OHVITD1", "25OHVITD2", "25OHVITD3", "TESTOFREE", "TESTOSTERONE"  Inflammation (CRP: Acute Phase) (ESR: Chronic Phase) No results found for: "CRP", "ESRSEDRATE", "LATICACIDVEN"       Note: Above Lab results reviewed.   Physical Exam  General appearance: Well nourished, well developed, and well hydrated. In no apparent acute distress Mental status: Alert, oriented x 3 (person, place, & time)       Respiratory: No evidence of acute respiratory distress Eyes: PERLA Vitals: BP 138/89 (BP Location: Right Arm, Patient Position: Sitting, Cuff Size: Normal)   Pulse 84   Temp (!)  97.2 F (36.2 C) (Temporal)   Resp 16   Ht 5\' 1"  (1.549 m)   Wt 185 lb (83.9 kg)  LMP 09/10/2019 Comment: sexually active  SpO2 98%   BMI 34.96 kg/m  BMI: Estimated body mass index is 34.96 kg/m as calculated from the following:   Height as of this encounter: 5\' 1"  (1.549 m).   Weight as of this encounter: 185 lb (83.9 kg). Ideal: Ideal body weight: 47.8 kg (105 lb 6.1 oz) Adjusted ideal body weight: 62.2 kg (137 lb 3.6 oz)  Lumbar Spine Area Exam  Skin & Axial Inspection: No masses, redness, or swelling Alignment: Symmetrical Functional ROM: Pain restricted ROM affecting both sides Stability: No instability detected Muscle Tone/Strength: Functionally intact. No obvious neuro-muscular anomalies detected. Sensory (Neurological): Dermatomal pain pattern Palpation: No palpable anomalies       Provocative Tests:  Lumbar quadrant test (Kemp's test): (+) bilateral for foraminal stenosis Lateral bending test: (+) ipsilateral radicular pain, bilaterally. Positive for bilateral foraminal stenosis.  Gait & Posture Assessment  Ambulation: Unassisted Gait: Relatively normal for age and body habitus Posture: WNL  Lower Extremity Exam    Side: Right lower extremity  Side: Left lower extremity  Stability: No instability observed          Stability: No instability observed          Skin & Extremity Inspection: Skin color, temperature, and hair growth are WNL. No peripheral edema or cyanosis. No masses, redness, swelling, asymmetry, or associated skin lesions. No contractures.  Skin & Extremity Inspection: Skin color, temperature, and hair growth are WNL. No peripheral edema or cyanosis. No masses, redness, swelling, asymmetry, or associated skin lesions. No contractures.  Functional ROM: Unrestricted ROM                  Functional ROM: Unrestricted ROM                  Muscle Tone/Strength: Functionally intact. No obvious neuro-muscular anomalies detected.  Muscle Tone/Strength:  Functionally intact. No obvious neuro-muscular anomalies detected.  Sensory (Neurological): Unimpaired        Sensory (Neurological): Unimpaired        DTR: Patellar: deferred today Achilles: deferred today Plantar: deferred today  DTR: Patellar: deferred today Achilles: deferred today Plantar: deferred today  Palpation: No palpable anomalies  Palpation: No palpable anomalies    Assessment   Diagnosis Status  1. Lumbar radiculopathy   2. Neuropathic pain of both legs   3. Lumbar spondylosis   4. Lumbar facet arthropathy    Having a Flare-up Having a Flare-up Controlled   Updated Problems: No problems updated.  Plan of Care  Problem-specific:  Assessment and Plan    Sciatica   Severe low back pain radiates from the hip to the leg, primarily on the right side, now affecting the left. A previous epidural in October targeted only the right side. The current exacerbation is likely due to sciatica. Discussed the risks and benefits of an epidural injection, including potential temporary relief and rare complications like infection or bleeding. Repeat L4/5 at midline.  Recommend alpha lipoic acid once daily, preferably in the morning to avoid sedation during work hours.  Neuropathic Pain   Severe nerve pain radiates from the lower back to the left foot, ankle, knee, and shoulder. Pain intensity has increased and is not relieved by current medications or physical adjustments. Discussed alpha lipoic acid as an alternative to gabapentin, highlighting its non-sedative properties and use in diabetic neuropathy. Start alpha lipoic acid once daily. Continue gabapentin at 2 PM to manage nerve pain. Consider further evaluation if symptoms persist.  Lumbar  Degenerative Disc Disease   Chronic lumbar pain is associated with known deterioration of the lumbar spine. A previous facet ablation in December provided relief. Continued degeneration may lead to increased pain and potential need for future  interventions. Continue current pain management strategies.  General Health Maintenance   Blood pressure is well-controlled. She has started taking magnesium supplements and maintaining a good diet. Continue magnesium supplements and maintain current diet and blood pressure management.  Follow-up   Schedule the epidural injection as soon as insurance approval is obtained. Monitor response to alpha lipoic acid and adjust as necessary.       Valerie Welch has a current medication list which includes the following long-term medication(s): amlodipine, azelastine, cetirizine, duloxetine, estradiol, gabapentin, iron polysaccharides, levothyroxine, montelukast, pantoprazole, and promethazine.  Pharmacotherapy (Medications Ordered): No orders of the defined types were placed in this encounter.  Orders:  Orders Placed This Encounter  Procedures   Lumbar Epidural Injection    Standing Status:   Future    Expected Date:   03/23/2024    Expiration Date:   05/27/2024    Scheduling Instructions:     Procedure: Interlaminar Lumbar Epidural Steroid injection (LESI)            Laterality: Midline L4/5     Sedation: PO Valium     Timeframe: ASAA    Where will this procedure be performed?:   [facility]   Follow-up plan:   Return in about 29 days (around 03/25/2024) for L4/5 ESI , in clinic (PO Valium 5mg ).      Right lower extremity CRPS, lumbar sympathetic nerve block denied by insurance.  Right L4-5 ESI #2 01/01/22, #3 02/28/22, #4 11/16/22, Consider spinal cord stimulator trial in future.  Bilateral L3, L4, L5 medial branch nerve block 01/16/2023, 03/20/23. Right L3,4,5 05/27/23, Left L3,4,5 RFA 06/12/23     Recent Visits Date Type Provider Dept  12/11/23 Procedure visit Edward Jolly, MD [facility]  Showing recent visits within past 90 days and meeting all other requirements Today's Visits Date Type Provider Dept  02/25/24 Office Visit Edward Jolly, MD [facility]  Showing today's visits and meeting all other requirements Future Appointments Date Type Provider Dept  03/11/24 Appointment Edward Jolly, MD [facility]  03/25/24 Appointment Edward Jolly, MD [facility]  Showing future appointments within next 90 days and meeting all other requirements  I discussed the assessment and treatment plan with the patient. The patient was provided an opportunity to ask questions and all were answered. The patient agreed with the plan and demonstrated an understanding of the instructions.  Patient advised to call back or seek an in-person evaluation if the symptoms or condition worsens.  Duration of encounter: .  Total time on encounter, as per AMA guidelines included both the face-to-face and non-face-to-face time personally spent by the physician and/or other qualified health care professional(s) on the day of the encounter (includes time in activities that require the physician or other qualified health care professional and does not include time in activities normally performed by clinical staff). Physician's time may include the following activities when performed: Preparing to see the patient (e.g., pre-charting review of records, searching for previously ordered imaging, lab work, and nerve conduction tests) Review of prior analgesic pharmacotherapies. Reviewing PMP Interpreting ordered tests (e.g., lab work, imaging, nerve conduction tests) Performing post-procedure evaluations, including interpretation of diagnostic procedures Obtaining and/or reviewing separately obtained history Performing a medically appropriate examination and/or evaluation  Counseling and educating the patient/family/caregiver Ordering medications, tests, or procedures Referring and communicating with other health care professionals (when not separately reported) Documenting clinical information in the electronic or other health  record Independently interpreting results (not separately reported) and communicating results to the patient/ family/caregiver Care coordination (not separately reported)  Note by: Edward Jolly, MD Date: 02/25/2024; Time: 3:57 PM

## 2024-02-25 NOTE — Patient Instructions (Addendum)
 Try alpha lipoic acid [phone] mg OTC [facility]   ______________________________________________________________________    General Risks and Possible Complications  Patient Responsibilities: It is important that you read this as it is part of your informed consent. It is our duty to inform you of the risks and possible complications associated with treatments offered to you. It is your responsibility as a patient to read this and to ask questions about anything that is not clear or that you believe was not covered in this document.  Patient's Rights: You have the right to refuse treatment. You also have the right to change your mind, even after initially having agreed to have the treatment done. However, under this last option, if you wait until the last second to change your mind, you may be charged for the materials used up to that point.  Introduction: Medicine is not an Visual merchandiser. Everything in Medicine, including the lack of treatment(s), carries the potential for danger, harm, or loss (which is by definition: Risk). In Medicine, a complication is a secondary problem, condition, or disease that can aggravate an already existing one. All treatments carry the risk of possible complications. The fact that a side effects or complications occurs, does not imply that the treatment was conducted incorrectly. It must be clearly understood that these can happen even when everything is done following the highest safety standards.  No treatment: You can choose not to proceed with the proposed treatment alternative. The "PRO(s)" would include: avoiding the risk of complications associated with the therapy. The "CON(s)" would include: not getting any of the treatment benefits. These benefits fall under one of three categories: diagnostic; therapeutic; and/or palliative. Diagnostic benefits include: getting information which can ultimately lead to improvement of the disease or symptom(s). Therapeutic benefits  are those associated with the successful treatment of the disease. Finally, palliative benefits are those related to the decrease of the primary symptoms, without necessarily curing the condition (example: decreasing the pain from a flare-up of a chronic condition, such as incurable terminal cancer).  General Risks and Complications: These are associated to most interventional treatments. They can occur alone, or in combination. They fall under one of the following six (6) categories: no benefit or worsening of symptoms; bleeding; infection; nerve damage; allergic reactions; and/or death. No benefits or worsening of symptoms: In Medicine there are no guarantees, only probabilities. No healthcare provider can ever guarantee that a medical treatment will work, they can only state the probability that it may. Furthermore, there is always the possibility that the condition may worsen, either directly, or indirectly, as a consequence of the treatment. Bleeding: This is more common if the patient is taking a blood thinner, either prescription or over the counter (example: Goody Powders, Fish oil, Aspirin, Garlic, etc.), or if suffering a condition associated with impaired coagulation (example: Hemophilia, cirrhosis of the liver, low platelet counts, etc.). However, even if you do not have one on these, it can still happen. If you have any of these conditions, or take one of these drugs, make sure to notify your treating physician. Infection: This is more common in patients with a compromised immune system, either due to disease (example: diabetes, cancer, human immunodeficiency virus [HIV], etc.), or due to medications or treatments (example: therapies used to treat cancer and rheumatological diseases). However, even if you do not have one on these, it can still happen. If you have any of these conditions, or take one of these drugs, make sure to notify your treating physician.  Nerve Damage: This is more common when  the treatment is an invasive one, but it can also happen with the use of medications, such as those used in the treatment of cancer. The damage can occur to small secondary nerves, or to large primary ones, such as those in the spinal cord and brain. This damage may be temporary or permanent and it may lead to impairments that can range from temporary numbness to permanent paralysis and/or brain death. Allergic Reactions: Any time a substance or material comes in contact with our body, there is the possibility of an allergic reaction. These can range from a mild skin rash (contact dermatitis) to a severe systemic reaction (anaphylactic reaction), which can result in death. Death: In general, any medical intervention can result in death, most of the time due to an unforeseen complication. ______________________________________________________________________      ______________________________________________________________________    Preparing for your procedure  Appointments: If you think you may not be able to keep your appointment, call 24-48 hours in advance to cancel. We need time to make it available to others.  Procedure visits are for procedures only. During your procedure appointment there will be: NO Prescription Refills*. NO medication changes or discussions*. NO discussion of disability issues*. NO unrelated pain problem evaluations*. NO evaluations to order other pain procedures*. *These will be addressed at a separate and distinct evaluation encounter on the provider's evaluation schedule and not during procedure days.  Instructions: Food intake: Avoid eating anything solid for at least 8 hours prior to your procedure. Clear liquid intake: You may take clear liquids such as water up to 2 hours prior to your procedure. (No carbonated drinks. No soda.) Transportation: Unless otherwise stated by your physician, bring a driver. (Driver cannot be a Market researcher, Pharmacist, community, or any other form of  public transportation.) Morning Medicines: Except for blood thinners, take all of your other morning medications with a sip of water. Make sure to take your heart and blood pressure medicines. If your blood pressure's lower number is above 100, the case will be rescheduled. Blood thinners: Make sure to stop your blood thinners as instructed.  If you take a blood thinner, but were not instructed to stop it, call our office [phone] and ask to talk to a nurse. Not stopping a blood thinner prior to certain procedures could lead to serious complications. Diabetics on insulin: Notify the staff so that you can be scheduled 1st case in the morning. If your diabetes requires high dose insulin, take only  of your normal insulin dose the morning of the procedure and notify the staff that you have done so. Preventing infections: Shower with an antibacterial soap the morning of your procedure.  Build-up your immune system: Take 1000 mg of Vitamin C with every meal (3 times a day) the day prior to your procedure. Antibiotics: Inform the nursing staff if you are taking any antibiotics or if you have any conditions that may require antibiotics prior to procedures. (Example: recent joint implants)   Pregnancy: If you are pregnant make sure to notify the nursing staff. Not doing so may result in injury to the fetus, including death.  Sickness: If you have a cold, fever, or any active infections, call and cancel or reschedule your procedure. Receiving steroids while having an infection may result in complications. Arrival: You must be in the facility at least 30 minutes prior to your scheduled procedure. Tardiness: Your scheduled time is also the cutoff time. If you do not arrive  at least 15 minutes prior to your procedure, you will be rescheduled.  Children: Do not bring any children with you. Make arrangements to keep them home. Dress appropriately: There is always a possibility that your clothing may get soiled.  Avoid long dresses. Valuables: Do not bring any jewelry or valuables.  Reasons to call and reschedule or cancel your procedure: (Following these recommendations will minimize the risk of a serious complication.) Surgeries: Avoid having procedures within 2 weeks of any surgery. (Avoid for 2 weeks before or after any surgery). Flu Shots: Avoid having procedures within 2 weeks of a flu shots or . (Avoid for 2 weeks before or after immunizations). Barium: Avoid having a procedure within 7-10 days after having had a radiological study involving the use of radiological contrast. (Myelograms, Barium swallow or enema study). Heart attacks: Avoid any elective procedures or surgeries for the initial 6 months after a "Myocardial Infarction" (Heart Attack). Blood thinners: It is imperative that you stop these medications before procedures. Let us know if you if you take any blood thinner.  Infection: Avoid procedures during or within two weeks of an infection (including chest colds or gastrointestinal problems). Symptoms associated with infections include: Localized redness, fever, chills, night sweats or profuse sweating, burning sensation when voiding, cough, congestion, stuffiness, runny nose, sore throat, diarrhea, nausea, vomiting, cold or Flu symptoms, recent or current infections. It is specially important if the infection is over the area that we intend to treat. Heart and lung problems: Symptoms that may suggest an active cardiopulmonary problem include: cough, chest pain, breathing difficulties or shortness of breath, dizziness, ankle swelling, uncontrolled high or unusually low blood pressure, and/or palpitations. If you are experiencing any of these symptoms, cancel your procedure and contact your primary care physician for an evaluation.  Remember:  Regular Business hours are:  Monday to Thursday 8:00 AM to 4:00 PM  Provider's Schedule: Delano Metz, MD:  Procedure days: Tuesday and Thursday  7:30 AM to 4:00 PM  Edward Jolly, MD:  Procedure days: Monday and Wednesday 7:30 AM to 4:00 PM Last  Updated: 12/03/2023 ______________________________________________________________________     Epidural Steroid Injection Patient Information  Description: The epidural space surrounds the nerves as they exit the spinal cord.  In some patients, the nerves can be compressed and inflamed by a bulging disc or a tight spinal canal (spinal stenosis).  By injecting steroids into the epidural space, we can bring irritated nerves into direct contact with a potentially helpful medication.  These steroids act directly on the irritated nerves and can reduce swelling and inflammation which often leads to decreased pain.  Epidural steroids may be injected anywhere along the spine and from the neck to the low back depending upon the location of your pain.   After numbing the skin with local anesthetic (like Novocaine), a small needle is passed into the epidural space slowly.  You may experience a sensation of pressure while this is being done.  The entire block usually last less than 10 minutes.  Conditions which may be treated by epidural steroids:  Low back and leg pain Neck and arm pain Spinal stenosis Post-laminectomy syndrome Herpes zoster (shingles) pain Pain from compression fractures  Preparation for the injection:  Do not eat any solid food or dairy products within 8 hours of your appointment.  You may drink clear liquids up to 3 hours before appointment.  Clear liquids include water, black coffee, juice or soda.  No milk or cream please. You may take your  regular medication, including pain medications, with a sip of water before your appointment  Diabetics should hold regular insulin (if taken separately) and take 1/2 normal NPH dos the morning of the procedure.  Carry some sugar containing items with you to your appointment. A driver must accompany you and be prepared to drive you home after  your procedure.  Bring all your current medications with your. An IV may be inserted and sedation may be given at the discretion of the physician.   A blood pressure cuff, EKG and other monitors will often be applied during the procedure.  Some patients may need to have extra oxygen administered for a short period. You will be asked to provide medical information, including your allergies, prior to the procedure.  We must know immediately if you are taking blood thinners (like Coumadin/Warfarin)  Or if you are allergic to IV iodine contrast (dye). We must know if you could possible be pregnant.  Possible side-effects: Bleeding from needle site Infection (rare, may require surgery) Nerve injury (rare) Numbness & tingling (temporary) Difficulty urinating (rare, temporary) Spinal headache ( a headache worse with upright posture) Light -headedness (temporary) Pain at injection site (several days) Decreased blood pressure (temporary) Weakness in arm/leg (temporary) Pressure sensation in back/neck (temporary)  Call if you experience: Fever/chills associated with headache or increased back/neck pain. Headache worsened by an upright position. New onset weakness or numbness of an extremity below the injection site Hives or difficulty breathing (go to the emergency room) Inflammation or drainage at the infection site Severe back/neck pain Any new symptoms which are concerning to you  Please note:  Although the local anesthetic injected can often make your back or neck feel good for several hours after the injection, the pain will likely return.  It takes 3-7 days for steroids to work in the epidural space.  You may not notice any pain relief for at least that one week.  If effective, we will often do a series of three injections spaced 3-6 weeks apart to maximally decrease your pain.  After the initial series, we generally will wait several months before considering a repeat injection of the  same type.  If you have any questions, please call [phone] [facility]

## 2024-02-26 ENCOUNTER — Encounter: Payer: Self-pay | Admitting: Student in an Organized Health Care Education/Training Program

## 2024-02-27 ENCOUNTER — Other Ambulatory Visit: Payer: Self-pay | Admitting: Student in an Organized Health Care Education/Training Program

## 2024-02-27 DIAGNOSIS — G5793 Unspecified mononeuropathy of bilateral lower limbs: Secondary | ICD-10-CM

## 2024-02-27 DIAGNOSIS — M5416 Radiculopathy, lumbar region: Secondary | ICD-10-CM

## 2024-02-27 NOTE — Telephone Encounter (Signed)
 We took care of this. Thank you for letting us know.

## 2024-03-05 ENCOUNTER — Ambulatory Visit
Admission: RE | Admit: 2024-03-05 | Discharge: 2024-03-05 | Disposition: A | Discharge status: Home / Self Care | Source: Ambulatory Visit | Attending: Student in an Organized Health Care Education/Training Program | Admitting: Student in an Organized Health Care Education/Training Program

## 2024-03-05 DIAGNOSIS — G5793 Unspecified mononeuropathy of bilateral lower limbs: Secondary | ICD-10-CM | POA: Diagnosis present

## 2024-03-05 DIAGNOSIS — M5416 Radiculopathy, lumbar region: Secondary | ICD-10-CM | POA: Diagnosis present

## 2024-03-11 ENCOUNTER — Ambulatory Visit: Payer: BC Managed Care – PPO | Admitting: Student in an Organized Health Care Education/Training Program

## 2024-03-16 ENCOUNTER — Ambulatory Visit
Admission: RE | Admit: 2024-03-16 | Discharge: 2024-03-16 | Disposition: A | Discharge status: Home / Self Care | Payer: BC Managed Care – PPO | Source: Ambulatory Visit | Attending: Internal Medicine | Admitting: Internal Medicine

## 2024-03-16 DIAGNOSIS — Z1231 Encounter for screening mammogram for malignant neoplasm of breast: Secondary | ICD-10-CM

## 2024-03-24 ENCOUNTER — Encounter: Payer: Self-pay | Admitting: Student in an Organized Health Care Education/Training Program

## 2024-03-24 DIAGNOSIS — M48061 Spinal stenosis, lumbar region without neurogenic claudication: Secondary | ICD-10-CM

## 2024-03-24 DIAGNOSIS — M5416 Radiculopathy, lumbar region: Secondary | ICD-10-CM

## 2024-03-25 ENCOUNTER — Ambulatory Visit: Admitting: Student in an Organized Health Care Education/Training Program

## 2024-04-08 ENCOUNTER — Encounter: Payer: Self-pay | Admitting: Student in an Organized Health Care Education/Training Program

## 2024-04-08 ENCOUNTER — Ambulatory Visit
Admission: RE | Admit: 2024-04-08 | Discharge: 2024-04-08 | Disposition: A | Discharge status: Home / Self Care | Source: Ambulatory Visit | Attending: Student in an Organized Health Care Education/Training Program | Admitting: Student in an Organized Health Care Education/Training Program

## 2024-04-08 ENCOUNTER — Ambulatory Visit
Attending: Student in an Organized Health Care Education/Training Program | Admitting: Student in an Organized Health Care Education/Training Program

## 2024-04-08 DIAGNOSIS — G5793 Unspecified mononeuropathy of bilateral lower limbs: Secondary | ICD-10-CM | POA: Insufficient documentation

## 2024-04-08 DIAGNOSIS — M5416 Radiculopathy, lumbar region: Secondary | ICD-10-CM | POA: Diagnosis present

## 2024-04-08 MED ORDER — DIAZEPAM 5 MG PO TABS
ORAL_TABLET | ORAL | Status: AC
  Filled 2024-04-08: qty 1

## 2024-04-08 MED ORDER — DEXAMETHASONE SODIUM PHOSPHATE 10 MG/ML IJ SOLN
INTRAMUSCULAR | Status: AC
  Filled 2024-04-08: qty 1

## 2024-04-08 MED ORDER — IOHEXOL 180 MG/ML  SOLN
10.0000 mL | Freq: Once | INTRAMUSCULAR | Status: AC
  Administered 2024-04-08: 10 mL via EPIDURAL

## 2024-04-08 MED ORDER — DIAZEPAM 5 MG PO TABS
5.0000 mg | ORAL_TABLET | ORAL | Status: AC
  Administered 2024-04-08: 5 mg via ORAL

## 2024-04-08 MED ORDER — IOHEXOL 180 MG/ML  SOLN
INTRAMUSCULAR | Status: AC
  Filled 2024-04-08: qty 20

## 2024-04-08 MED ORDER — DEXAMETHASONE SODIUM PHOSPHATE 10 MG/ML IJ SOLN
10.0000 mg | Freq: Once | INTRAMUSCULAR | Status: AC
  Administered 2024-04-08: 10 mg

## 2024-04-08 MED ORDER — LIDOCAINE HCL 2 % IJ SOLN
20.0000 mL | Freq: Once | INTRAMUSCULAR | Status: AC
  Administered 2024-04-08: 400 mg

## 2024-04-08 MED ORDER — ROPIVACAINE HCL 2 MG/ML IJ SOLN
2.0000 mL | Freq: Once | INTRAMUSCULAR | Status: AC
  Administered 2024-04-08: 2 mL via EPIDURAL

## 2024-04-08 MED ORDER — ROPIVACAINE HCL 2 MG/ML IJ SOLN
INTRAMUSCULAR | Status: AC
  Filled 2024-04-08: qty 20

## 2024-04-08 MED ORDER — SODIUM CHLORIDE (PF) 0.9 % IJ SOLN
INTRAMUSCULAR | Status: AC
  Filled 2024-04-08: qty 10

## 2024-04-08 MED ORDER — SODIUM CHLORIDE 0.9% FLUSH
2.0000 mL | Freq: Once | INTRAVENOUS | Status: AC
  Administered 2024-04-08: 2 mL

## 2024-04-08 MED ORDER — LIDOCAINE HCL 2 % IJ SOLN
INTRAMUSCULAR | Status: AC
  Filled 2024-04-08: qty 20

## 2024-04-08 NOTE — Progress Notes (Signed)
 PROVIDER NOTE: Information contained herein reflects review and annotations entered in association with encounter. Interpretation of such information and data should be left to medically-trained personnel. Information provided to patient can be located elsewhere in the medical record under "Patient Instructions". Document created using STT-dictation technology, any transcriptional errors that may result from process are unintentional.    Patient: Valerie Welch  Service Category: Procedure Provider: Cephus Collin, MD DOB: 1971/11/21 DOS: 04/08/2024 Location: [facility] MRN: [id number] Setting: Ambulatory - outpatient Referring Provider: Cephus Collin, MD Type: Established Patient Specialty: Interventional Pain Management PCP: Antonio Baumgarten, MD  Primary Reason for Visit: Interventional Pain Management Treatment. CC: Back Pain (lower)    Procedure:          Anesthesia, Analgesia, Anxiolysis:  Type: Therapeutic Inter-Laminar Epidural Steroid Injection  #1 in 2025 Region: Lumbar Level: L4-5 Level. Laterality: Right-Sided         Anesthesia: Local (1-2% Lidocaine)  Anxiolysis: PO Valium 5 mg Guidance: Fluoroscopy           Position: Prone with head of the table was raised to facilitate breathing.   Indications: 1. Lumbar radiculopathy   2. Neuropathic pain of both legs      Pain Score: Pre-procedure: 6 /10 Post-procedure: 6 /10    Pre-op H&P Assessment:  Valerie Welch is a 53 y.o. (year old), female patient, seen today for interventional treatment. She  has a past surgical history that includes Cesarean section; Cholecystectomy, laparoscopic; Ankle arthroscopy (Right, 09/26/2018); Tenotomy / flexor tendon transfer (Right, 09/26/2018); Arthrodesis metatarsalphalangeal joint (mtpj) (Right, 09/26/2018); Exostectectomy toe (Right, 09/26/2018); Cholecystectomy; Colonoscopy with propofol (N/A, 06/16/2020); Esophagogastroduodenoscopy (egd) with propofol (N/A,  06/16/2020); Robotic assisted laparoscopic hysterectomy and salpingectomy (N/A, 07/05/2020); Colonoscopy with propofol (N/A, 02/15/2023); Esophagogastroduodenoscopy (egd) with propofol (N/A, 02/15/2023); Hiatal hernia repair (09/16/2023); and Colonoscopy with propofol (N/A, 12/06/2023). Valerie Welch has a current medication list which includes the following prescription(s): albuterol, alprazolam, amlodipine, ascorbic acid, ashwagandha, azelastine, bepreve, bupropion, cetirizine, b-12, diclofenac sodium, dss, estradiol, fluticasone-salmeterol, fluticasone-umeclidin-vilant, gabapentin, hydrocodone-acetaminophen, hydrocodone-acetaminophen, iron polysaccharides, levothyroxine, montelukast, multivitamin with minerals, NON FORMULARY, pantoprazole, polyethylene glycol, promethazine, rosuvastatin, tinidazole, trolamine salicylate, and duloxetine. Her primarily concern today is the Back Pain (lower)   Initial Vital Signs:  Pulse/HCG Rate: 86ECG Heart Rate: 87 Temp:  (!) 97.5 F (36.4 C) Resp: 12 BP:  (!) 138/98 SpO2: 100 %  BMI: Estimated body mass index is 34.96 kg/m as calculated from the following:   Height as of this encounter: 5\' 1"  (1.549 m).   Weight as of this encounter: 185 lb (83.9 kg).  Risk Assessment: Allergies: Reviewed. She is allergic to beef-derived drug products, other, percocet [oxycodone-acetaminophen], and pitocin [oxytocin].  Allergy Precautions: None required Coagulopathies: Reviewed. None identified.  Blood-thinner therapy: None at this time Active Infection(s): Reviewed. None identified. Valerie Welch is afebrile  Site Confirmation: Valerie Welch was asked to confirm the procedure and laterality before marking the site Procedure checklist: Completed Consent: Before the procedure and under the influence of no sedative(s), amnesic(s), or anxiolytics, the patient was informed of the treatment options, risks and possible complications. To fulfill our ethical and  legal obligations, as recommended by the American Medical Association's Code of Ethics, I have informed the patient of my clinical impression; the nature and purpose of the treatment or procedure; the risks, benefits, and possible complications of the intervention; the alternatives, including doing nothing; the risk(s) and benefit(s) of the alternative treatment(s) or procedure(s); and the risk(s) and benefit(s) of doing nothing. The patient was  provided information about the general risks and possible complications associated with the procedure. These may include, but are not limited to: failure to achieve desired goals, infection, bleeding, organ or nerve damage, allergic reactions, paralysis, and death. In addition, the patient was informed of those risks and complications associated to Spine-related procedures, such as failure to decrease pain; infection (i.e.: Meningitis, epidural or intraspinal abscess); bleeding (i.e.: epidural hematoma, subarachnoid hemorrhage, or any other type of intraspinal or peri-dural bleeding); organ or nerve damage (i.e.: Any type of peripheral nerve, nerve root, or spinal cord injury) with subsequent damage to sensory, motor, and/or autonomic systems, resulting in permanent pain, numbness, and/or weakness of one or several areas of the body; allergic reactions; (i.e.: anaphylactic reaction); and/or death. Furthermore, the patient was informed of those risks and complications associated with the medications. These include, but are not limited to: allergic reactions (i.e.: anaphylactic or anaphylactoid reaction(s)); adrenal axis suppression; blood sugar elevation that in diabetics may result in ketoacidosis or comma; water retention that in patients with history of congestive heart failure may result in shortness of breath, pulmonary edema, and decompensation with resultant heart failure; weight gain; swelling or edema; medication-induced neural toxicity; particulate matter  embolism and blood vessel occlusion with resultant organ, and/or nervous system infarction; and/or aseptic necrosis of one or more joints. Finally, the patient was informed that Medicine is not an exact science; therefore, there is also the possibility of unforeseen or unpredictable risks and/or possible complications that may result in a catastrophic outcome. The patient indicated having understood very clearly. We have given the patient no guarantees and we have made no promises. Enough time was given to the patient to ask questions, all of which were answered to the patient's satisfaction. Ms. Marcelyn Ditty has indicated that she wanted to continue with the procedure. Attestation: I, the ordering provider, attest that I have discussed with the patient the benefits, risks, side-effects, alternatives, likelihood of achieving goals, and potential problems during recovery for the procedure that I have provided informed consent. Date  Time: 04/08/2024  1:01 PM  Pre-Procedure Preparation:  Monitoring: As per clinic protocol. Respiration, ETCO2, SpO2, BP, heart rate and rhythm monitor placed and checked for adequate function Safety Precautions: Patient was assessed for positional comfort and pressure points before starting the procedure. Time-out: I initiated and conducted the "Time-out" before starting the procedure, as per protocol. The patient was asked to participate by confirming the accuracy of the "Time Out" information. Verification of the correct person, site, and procedure were performed and confirmed by me, the nursing staff, and the patient. "Time-out" conducted as per Joint Commission's Universal Protocol (UP.01.01.01). Time: 1335  Description of Procedure:          Target Area: The interlaminar space, initially targeting the lower laminar border of the superior vertebral body. Approach: Paramedial approach. Area Prepped: Entire Posterior Lumbar Region DuraPrep (Iodine Povacrylex [0.7%  available iodine] and Isopropyl Alcohol, 74% w/w) Safety Precautions: Aspiration looking for blood return was conducted prior to all injections. At no point did we inject any substances, as a needle was being advanced. No attempts were made at seeking any paresthesias. Safe injection practices and needle disposal techniques used. Medications properly checked for expiration dates. SDV (single dose vial) medications used. Description of the Procedure: Protocol guidelines were followed. The procedure needle was introduced through the skin, ipsilateral to the reported pain, and advanced to the target area. Bone was contacted and the needle walked caudad, until the lamina was cleared. The epidural space  was identified using "loss-of-resistance technique" with 2-3 ml of PF-NaCl (0.9% NSS), in a 5cc LOR glass syringe.  Vitals:   04/08/24 1322 04/08/24 1327 04/08/24 1330 04/08/24 1335  BP: (!) 145/111 (!) 153/127 (!) 115/103 (!) 117/105  Pulse:      Resp: 12 (!) 9 11 12   Temp:      SpO2:  100% 98% 98%  Weight:      Height:           Start Time: 1335 hrs. End Time: 1339 hrs.  Materials:  Needle(s) Type: Epidural needle Gauge: 22G Length: 5-in Medication(s): Please see orders for medications and dosing details.  6 cc solution made of 3 cc of preservative-free saline, 2 cc of 0.2% ropivacaine, 1 cc of Decadron 10 mg/cc.  Imaging Guidance (Spinal):          Type of Imaging Technique: Fluoroscopy Guidance (Spinal) Indication(s): Assistance in needle guidance and placement for procedures requiring needle placement in or near specific anatomical locations not easily accessible without such assistance. Exposure Time: Please see nurses notes. Contrast: Before injecting any contrast, we confirmed that the patient did not have an allergy to iodine, shellfish, or radiological contrast. Once satisfactory needle placement was completed at the desired level, radiological contrast was injected. Contrast  injected under live fluoroscopy. No contrast complications. See chart for type and volume of contrast used. Fluoroscopic Guidance: I was personally present during the use of fluoroscopy. "Tunnel Vision Technique" used to obtain the best possible view of the target area. Parallax error corrected before commencing the procedure. "Direction-depth-direction" technique used to introduce the needle under continuous pulsed fluoroscopy. Once target was reached, antero-posterior, oblique, and lateral fluoroscopic projection used confirm needle placement in all planes. Images permanently stored in EMR. Interpretation: I personally interpreted the imaging intraoperatively. Adequate needle placement confirmed in multiple planes. Appropriate spread of contrast into desired area was observed. No evidence of afferent or efferent intravascular uptake. No intrathecal or subarachnoid spread observed. Permanent images saved into the patient's record.   Post-operative Assessment:  Post-procedure Vital Signs:  Pulse/HCG Rate: 8684 Temp:  (!) 97.5 F (36.4 C) Resp: 12 BP:  (!) 117/105 SpO2: 98 %  EBL: None  Complications: No immediate post-treatment complications observed by team, or reported by patient.  Note: The patient tolerated the entire procedure well. A repeat set of vitals were taken after the procedure and the patient was kept under observation following institutional policy, for this type of procedure. Post-procedural neurological assessment was performed, showing return to baseline, prior to discharge. The patient was provided with post-procedure discharge instructions, including a section on how to identify potential problems. Should any problems arise concerning this procedure, the patient was given instructions to immediately contact us, at any time, without hesitation. In any case, we plan to contact the patient by telephone for a follow-up status report regarding this interventional  procedure.  Comments:  No additional relevant information.  Plan of Care  Orders:  Orders Placed This Encounter  Procedures   DG PAIN CLIN[facility] C-ARM 1-60 MIN NO REPORT    Intraoperative interpretation by procedural physician at [facility].    Standing Status:   Standing    Number of Occurrences:   1    Reason for exam::   Assistance in needle guidance and placement for procedures requiring needle placement in or near specific anatomical locations not easily accessible without such assistance.    Medications ordered for procedure: Meds ordered this encounter  Medications   iohexol (  OMNIPAQUE) 180 MG/ML injection 10 mL    Must be Myelogram-compatible. If not available, you may substitute with a water-soluble, non-ionic, hypoallergenic, myelogram-compatible radiological contrast medium.   lidocaine (XYLOCAINE) 2 % (with pres) injection 400 mg   diazepam (VALIUM) tablet 5 mg    Make sure Flumazenil is available in the pyxis when using this medication. If oversedation occurs, administer 0.2 mg IV over 15 sec. If after 45 sec no response, administer 0.2 mg again over 1 min; may repeat at 1 min intervals; not to exceed 4 doses (1 mg)   ropivacaine (PF) 2 mg/mL (0.2%) (NAROPIN) injection 2 mL   sodium chloride flush (NS) 0.9 % injection 2 mL   dexamethasone (DECADRON) injection 10 mg   Medications administered: We administered iohexol, lidocaine, diazepam, ropivacaine (PF) 2 mg/mL (0.2%), sodium chloride flush, and dexamethasone.  See the medical record for exact dosing, route, and time of administration.  Follow-up plan:   Return in about 8 weeks (around 06/03/2024) for PPE, F2F.     Recent Visits Date Type Provider Dept  02/25/24 Office Visit Cephus Collin, MD [facility]  Showing recent visits within past 90 days and meeting all other requirements Today's Visits Date Type Provider Dept  04/08/24 Procedure visit Cephus Collin, MD [facility]  Showing  today's visits and meeting all other requirements Future Appointments Date Type Provider Dept  06/03/24 Appointment Cephus Collin, MD [facility]  Showing future appointments within next 90 days and meeting all other requirements  Disposition: Discharge home  Discharge (Date  Time): 04/08/2024; 1347 hrs.   Primary Care Physician: Antonio Baumgarten, MD Location: [facility] Note by: Cephus Collin, MD Date: 04/08/2024; Time: 1:47 PM  Disclaimer:  Medicine is not an exact science. The only guarantee in medicine is that nothing is guaranteed. It is important to note that the decision to proceed with this intervention was based on the information collected from the patient. The Data and conclusions were drawn from the patient's questionnaire, the interview, and the physical examination. Because the information was provided in large part by the patient, it cannot be guaranteed that it has not been purposely or unconsciously manipulated. Every effort has been made to obtain as much relevant data as possible for this evaluation. It is important to note that the conclusions that lead to this procedure are derived in large part from the available data. Always take into account that the treatment will also be dependent on availability of resources and existing treatment guidelines, considered by other Pain Management Practitioners as being common knowledge and practice, at the time of the intervention. For Medico-Legal purposes, it is also important to point out that variation in procedural techniques and pharmacological choices are the acceptable norm. The indications, contraindications, technique, and results of the above procedure should only be interpreted and judged by a Board-Certified Interventional Pain Specialist with extensive familiarity and expertise in the same exact procedure and technique.

## 2024-04-08 NOTE — Patient Instructions (Signed)

## 2024-04-08 NOTE — Progress Notes (Signed)
 Safety precautions to be maintained throughout the outpatient stay will include: orient to surroundings, keep bed in low position, maintain call bell within reach at all times, provide assistance with transfer out of bed and ambulation.

## 2024-04-09 ENCOUNTER — Telehealth: Payer: Self-pay

## 2024-04-09 NOTE — Telephone Encounter (Signed)
 No issues post-procedure.

## 2024-04-18 ENCOUNTER — Other Ambulatory Visit: Payer: Self-pay | Admitting: Student in an Organized Health Care Education/Training Program

## 2024-04-18 DIAGNOSIS — G894 Chronic pain syndrome: Secondary | ICD-10-CM

## 2024-04-18 DIAGNOSIS — G5771 Causalgia of right lower limb: Secondary | ICD-10-CM

## 2024-04-18 DIAGNOSIS — M5416 Radiculopathy, lumbar region: Secondary | ICD-10-CM

## 2024-04-18 DIAGNOSIS — G5793 Unspecified mononeuropathy of bilateral lower limbs: Secondary | ICD-10-CM

## 2024-04-18 DIAGNOSIS — M792 Neuralgia and neuritis, unspecified: Secondary | ICD-10-CM

## 2024-04-23 ENCOUNTER — Other Ambulatory Visit: Payer: Self-pay | Admitting: *Deleted

## 2024-04-23 DIAGNOSIS — G5771 Causalgia of right lower limb: Secondary | ICD-10-CM

## 2024-04-23 DIAGNOSIS — M792 Neuralgia and neuritis, unspecified: Secondary | ICD-10-CM

## 2024-04-23 DIAGNOSIS — G894 Chronic pain syndrome: Secondary | ICD-10-CM

## 2024-04-23 DIAGNOSIS — G5793 Unspecified mononeuropathy of bilateral lower limbs: Secondary | ICD-10-CM

## 2024-04-23 DIAGNOSIS — M5416 Radiculopathy, lumbar region: Secondary | ICD-10-CM

## 2024-04-23 MED ORDER — DULOXETINE HCL 60 MG PO CPEP: 60.0000 mg | ORAL_CAPSULE | Freq: Every day | ORAL | 1 refills | Status: DC

## 2024-04-23 NOTE — Telephone Encounter (Signed)
 Left voicemail for patient re; Rx being sent in.

## 2024-06-03 ENCOUNTER — Ambulatory Visit: Admitting: Student in an Organized Health Care Education/Training Program

## 2024-06-08 NOTE — Progress Notes (Signed)
 PROVIDER NOTE: Interpretation of information contained herein should be left to medically-trained personnel. Specific patient instructions are provided elsewhere under Patient Instructions section of medical record. This document was created in part using AI and STT-dictation technology, any transcriptional errors that may result from this process are unintentional.  Patient: Valerie Welch  Service: E/M   PCP: Sadie Manna, MD  DOB: 05/18/1971  DOS: 06/09/2024  Provider: Emmy MARLA Blanch, NP  MRN: [id number]  Delivery: Face-to-face  Specialty: Interventional Pain Management  Type: Established Patient  Setting: Ambulatory outpatient facility  Specialty designation: 09  Referring Prov.: Sadie Manna, MD  Location: Outpatient office facility       History of present illness (HPI) [name], a 53 y.o. year old female, is here today because of her Chronic pain syndrome [G89.4]. [name] primary complain today is Back Pain (Mid to right lower ) and Knee Pain (Right )  Pertinent problems: [name] has history of foot surgery; generalized anxiety disorder; neuropathic pain of both legs; complex regional pain syndrome type II of right lower limb; and neuropathy pain of the right foot on the pertinent problem list.   Pain Assessment: Severity of Chronic pain is reported as a 5 /10. Location: Back Lower, Right, Left/Denies however has pain in right knee. Onset: More than a month ago. Quality: Constant, Burning, Cramping, Numbness. Timing: Constant. Modifying factor(s): Procedure, CBD cream OTC, diclofenac  cream OTC. Pain medications helps at the end of the day.. Vitals:  height is 5' 1 (1.549 m) and weight is 187 lb (84.8 kg). Her temporal temperature is 97.3 F (36.3 C) (abnormal). Her blood pressure is 142/105 (abnormal) and her pulse is 85. Her respiration is 16 and oxygen saturation is 100%.  BMI: Estimated body mass index is 35.33 kg/m as calculated from  the following:   Height as of this encounter: 5' 1 (1.549 m).   Weight as of this encounter: 187 lb (84.8 kg).  Last encounter: 02/25/2024 Last procedure: 04/08/2024  Reason for encounter: both, medication management and post-procedure evaluation and assessment.  The patient reports 100% pain relief and functional improvement during the local anesthetic and ongoing 50% pain relief and functional improvement.   Procedure Procedure:           Anesthesia, Analgesia, Anxiolysis:  Type: Therapeutic Inter-Laminar Epidural Steroid Injection  #1 in 2025 Region: Lumbar Level: L4-5 Level. Laterality: Right-Sided          Anesthesia: Local (1-2% Lidocaine )  Anxiolysis: PO Valium  5 mg Guidance: Fluoroscopy             Position: Prone with head of the table was raised to facilitate breathing.    Indications: 1. Lumbar radiculopathy   2. Neuropathic pain of both legs       Pain Score: Pre-procedure: 6 /10 Post-procedure: 6 /10   Post-Procedure Evaluation   Effectiveness:  Initial hour after procedure: 100 % . Subsequent 4-6 hours post-procedure: 100 % . Analgesia past initial 6 hours: 50 % (80% relief first few weeks and pain gradually returned to where it is now at 50%.)  Ongoing improvement:  Analgesic:  [name] received a therapeutic Inter-Laminar Epidural Steroid Injection on April 08, 2024.  She reports 100% pain relief and functional improvement during the local anesthetic and ongoing 50% pain relief and functional improvement. Function: [name] reports improvement in function ROM: [name] reports improvement in ROM  Pharmacotherapy Assessment   Analgesic: Gabapentin  (Neurontin ) 300 mg capsule in the morning, 300 Mg  in the afternoon, 600 Mg nightly. Duloxetine  (Cymbalta ) 60 mg 1 capsule daily Monitoring: Valerie Welch PMP: PDMP reviewed during this encounter.       Pharmacotherapy: No side-effects or adverse reactions reported. Compliance: No problems  identified. Effectiveness: Clinically acceptable.  No notes on file  UDS:  No results found for: SUMMARY  No results found for: CBDTHCR No results found for: D8THCCBX No results found for: D9THCCBX  ROS  Constitutional: Denies any fever or chills Gastrointestinal: No reported hemesis, hematochezia, vomiting, or acute GI distress Musculoskeletal: Lower back pain with radiation to bilateral legs, right knee pain Neurological: No reported episodes of acute onset apraxia, aphasia, dysarthria, agnosia, amnesia, paralysis, loss of coordination, or loss of consciousness  Medication Review  ALPRAZolam, ARIPiprazole, Ashwagandha, B-12, Bepotastine Besilate, DSS, DULoxetine , Fluticasone-Salmeterol, Fluticasone-Umeclidin-Vilant, HYDROcodone -Acetaminophen , NON FORMULARY, Pseudoephedrine-guaiFENesin, albuterol , amLODipine, ascorbic acid, azelastine, buPROPion, cetirizine, diclofenac  sodium, estradiol, gabapentin , iron polysaccharides, levothyroxine, montelukast , multivitamin with minerals, pantoprazole, polyethylene glycol, promethazine, rosuvastatin, tinidazole , tirzepatide, and trolamine salicylate  History Review  Allergy: [name] has no known allergies. Drug: [name]  reports no history of drug use. Alcohol:  reports current alcohol use. Tobacco:  reports that she has never smoked. She has never used smokeless tobacco. Social: [name]  reports that she has never smoked. She has never used smokeless tobacco. She reports current alcohol use. She reports that she does not use drugs. Medical:  has a past medical history of Anemia, Arthritis, Asthma, Barrett's esophagus, Bursitis, Depression, Fibromyalgia, GERD (gastroesophageal reflux disease), History of hiatal hernia, Hypothyroidism, Nontoxic uninodular goiter, and Pernicious anemia. Surgical: [name]  has a past surgical history that includes Cesarean section; Cholecystectomy, laparoscopic; Ankle  arthroscopy (Right, 09/26/2018); Tenotomy / flexor tendon transfer (Right, 09/26/2018); Arthrodesis metatarsalphalangeal joint (mtpj) (Right, 09/26/2018); Exostectectomy toe (Right, 09/26/2018); Cholecystectomy; Colonoscopy with propofol  (N/A, 06/16/2020); Esophagogastroduodenoscopy (egd) with propofol  (N/A, 06/16/2020); Robotic assisted laparoscopic hysterectomy and salpingectomy (N/A, 07/05/2020); Colonoscopy with propofol  (N/A, 02/15/2023); Esophagogastroduodenoscopy (egd) with propofol  (N/A, 02/15/2023); Hiatal hernia repair (09/16/2023); and Colonoscopy with propofol  (N/A, 12/06/2023). Family: family history includes Cancer in her paternal grandfather; Diabetes in her mother; Heart disease in her mother; Hypertension in her maternal grandfather; Stroke in her brother, father, and mother.  Laboratory Chemistry Profile   Renal Lab Results  Component Value Date   BUN 7 07/01/2020   CREATININE 1.00 07/01/2020   GFRAA >60 07/01/2020   GFRNONAA >60 07/01/2020    Hepatic No results found for: AST, ALT, ALBUMIN , ALKPHOS, HCVAB, AMYLASE, LIPASE, AMMONIA  Electrolytes Lab Results  Component Value Date   NA 139 07/01/2020   K 4.5 07/01/2020   CL 108 07/01/2020   CALCIUM 9.2 07/01/2020    Bone No results found for: VD25OH, VD125OH2TOT, CI6874NY7, CI7874NY7, 25OHVITD1, 25OHVITD2, 25OHVITD3, TESTOFREE, TESTOSTERONE  Inflammation (CRP: Acute Phase) (ESR: Chronic Phase) No results found for: CRP, ESRSEDRATE, LATICACIDVEN       Note: Above Lab results reviewed.  Recent Imaging Review  DG PAIN CLINIC C-ARM 1-60 MIN NO REPORT Fluoro was used, but no Radiologist interpretation will be provided.  Please refer to NOTES tab for provider progress note. Note: Reviewed        Physical Exam  General appearance: Well nourished, well developed, and well hydrated. In no apparent acute distress Mental status: Alert, oriented x 3 (person, place, & time)        Respiratory: No evidence of acute respiratory distress Eyes: PERLA Vitals: BP (!) 142/105 (Patient Position: Sitting, Cuff Size: Normal)   Pulse 85   Temp ROLLEN)  97.3 F (36.3 C) (Temporal)   Resp 16   Ht 5' 1 (1.549 m)   Wt 187 lb (84.8 kg)   LMP 09/10/2019 Comment: sexually active  SpO2 100%   BMI 35.33 kg/m  BMI: Estimated body mass index is 35.33 kg/m as calculated from the following:   Height as of this encounter: 5' 1 (1.549 m).   Weight as of this encounter: 187 lb (84.8 kg). Ideal: Ideal body weight: 47.8 kg (105 lb 6.1 oz) Adjusted ideal body weight: 62.6 kg (138 lb 0.5 oz)  Assessment   Diagnosis Status  1. Chronic pain syndrome   2. Lumbar radiculopathy   3. Complex regional pain syndrome type II of right lower limb   4. Neuropathic pain of both legs   5. Neuropathic pain of right foot   6. Lumbar spondylosis   7. Lumbar facet arthropathy   8. Arthritis of left hip   9. Arthritis of right hip    Controlled Controlled Controlled   Updated Problems: No problems updated.  Plan of Care  Problem-specific:  Assessment and Plan Neuropathic pain Continue gabapentin  300 Mg in the morning 300 Mg afternoon and 600 Mg at night to manage nerve pain.  Continue on duloxetine .  Continue current pain management strategies.  Consider further evaluation if symptoms persist.   [name] has a current medication list which includes the following long-term medication(s): amlodipine, aripiprazole, azelastine, cetirizine, estradiol, iron polysaccharides, levothyroxine, montelukast , pantoprazole, promethazine, duloxetine , and gabapentin .  Pharmacotherapy (Medications Ordered): Meds ordered this encounter  Medications   gabapentin  (NEURONTIN ) 300 MG capsule    Sig: 300 mg in the morning, 300 mg in the afternoon, 600 mg nightly    Dispense:  120 capsule    Refill:  5   DULoxetine  (CYMBALTA ) 60 MG capsule    Sig: Take 1 capsule (60 mg total) by mouth daily.     Dispense:  90 capsule    Refill:  1   Orders:  No orders of the defined types were placed in this encounter.       Return in about 6 months (around 12/09/2024) for (F2F), (MM), Emmy Blanch NP.    Recent Visits Date Type Provider Dept  04/08/24 Procedure visit Marcelino Nurse, MD [facility]  Showing recent visits within past 90 days and meeting all other requirements Today's Visits Date Type Provider Dept  06/09/24 Office Visit Marshal Schrecengost K, NP [facility]  Showing today's visits and meeting all other requirements Future Appointments No visits were found meeting these conditions. Showing future appointments within next 90 days and meeting all other requirements  I discussed the assessment and treatment plan with the patient. The patient was provided an opportunity to ask questions and all were answered. The patient agreed with the plan and demonstrated an understanding of the instructions.  Patient advised to call back or seek an in-person evaluation if the symptoms or condition worsens.  Duration of encounter: 30 minutes.  Total time on encounter, as per AMA guidelines included both the face-to-face and non-face-to-face time personally spent by the physician and/or other qualified health care professional(s) on the day of the encounter (includes time in activities that require the physician or other qualified health care professional and does not include time in activities normally performed by clinical staff). Physician's time may include the following activities when performed: Preparing to see the patient (e.g., pre-charting review of records, searching for previously ordered imaging, lab work, and nerve conduction tests) Review  of prior analgesic pharmacotherapies. Reviewing PMP Interpreting ordered tests (e.g., lab work, imaging, nerve conduction tests) Performing post-procedure evaluations, including interpretation of diagnostic procedures Obtaining and/or  reviewing separately obtained history Performing a medically appropriate examination and/or evaluation Counseling and educating the patient/family/caregiver Ordering medications, tests, or procedures Referring and communicating with other health care professionals (when not separately reported) Documenting clinical information in the electronic or other health record Independently interpreting results (not separately reported) and communicating results to the patient/ family/caregiver Care coordination (not separately reported)  Note by: Neftali Abair K Musab Wingard, NP (TTS and AI technology used. I apologize for any typographical errors that were not detected and corrected.) Date: 06/09/2024; Time: 1:02 PM

## 2024-06-09 ENCOUNTER — Encounter: Payer: Self-pay | Admitting: Nurse Practitioner

## 2024-06-09 ENCOUNTER — Ambulatory Visit: Attending: Student in an Organized Health Care Education/Training Program | Admitting: Nurse Practitioner

## 2024-06-09 VITALS — BP 142/105 | HR 85 | Temp 97.3°F | Resp 16 | Ht 61.0 in | Wt 187.0 lb

## 2024-06-09 DIAGNOSIS — G5771 Causalgia of right lower limb: Secondary | ICD-10-CM | POA: Diagnosis present

## 2024-06-09 DIAGNOSIS — M5416 Radiculopathy, lumbar region: Secondary | ICD-10-CM | POA: Insufficient documentation

## 2024-06-09 DIAGNOSIS — M792 Neuralgia and neuritis, unspecified: Secondary | ICD-10-CM | POA: Insufficient documentation

## 2024-06-09 DIAGNOSIS — M1612 Unilateral primary osteoarthritis, left hip: Secondary | ICD-10-CM | POA: Insufficient documentation

## 2024-06-09 DIAGNOSIS — M1611 Unilateral primary osteoarthritis, right hip: Secondary | ICD-10-CM | POA: Diagnosis present

## 2024-06-09 DIAGNOSIS — M160 Bilateral primary osteoarthritis of hip: Secondary | ICD-10-CM

## 2024-06-09 DIAGNOSIS — G894 Chronic pain syndrome: Secondary | ICD-10-CM | POA: Diagnosis present

## 2024-06-09 DIAGNOSIS — G5793 Unspecified mononeuropathy of bilateral lower limbs: Secondary | ICD-10-CM | POA: Diagnosis present

## 2024-06-09 DIAGNOSIS — M47816 Spondylosis without myelopathy or radiculopathy, lumbar region: Secondary | ICD-10-CM | POA: Diagnosis present

## 2024-06-09 MED ORDER — GABAPENTIN 300 MG PO CAPS: ORAL_CAPSULE | ORAL | 5 refills | Status: DC

## 2024-06-09 MED ORDER — DULOXETINE HCL 60 MG PO CPEP: 60.0000 mg | ORAL_CAPSULE | Freq: Every day | ORAL | 1 refills | Status: DC

## 2024-06-09 NOTE — Patient Instructions (Signed)

## 2024-06-24 ENCOUNTER — Telehealth: Payer: Self-pay | Admitting: Nurse Practitioner

## 2024-06-24 DIAGNOSIS — M47816 Spondylosis without myelopathy or radiculopathy, lumbar region: Secondary | ICD-10-CM

## 2024-06-24 NOTE — Telephone Encounter (Signed)
 Last LESI 04-08-24. Will you order another ?

## 2024-06-24 NOTE — Telephone Encounter (Signed)
 Patient is having increased lumbar spine pain related to lumbar facet arthropathy.  She is status post bilateral L3, L4, L5 RFA on 12/11/2023 that provided her with 80% pain relief for approximately 6 months.  Given return of pain, we discussed repeating lumbar RFA.  Risk and benefits reviewed and patient would like to proceed.  Orders Placed This Encounter  Procedures   Radiofrequency,Lumbar    Standing Status:   Future    Expiration Date:   09/24/2024    Scheduling Instructions:     Procedure: Lumbar Facet, Medial Branch Radiofrequency Ablation (RFA) #3      Laterality: Bilateral (-50)      Level: L3, L4, and L5 Medial Branch Level(s). These levels will denervate the L3-4 and L4-5 lumbar facet joints.      Imaging: Fluoroscopy-guided             Anesthesia: Local anesthesia (1-2% Lidocaine )     Moderate Sedation    Where will this procedure be performed?:   [facility]

## 2024-06-24 NOTE — Telephone Encounter (Signed)
 Patient is having a lot of back pain and was waiting on appt for injections. When she saw SPatel told her she would follow up with Dr Marcelino. She would like to schedule injections asap. I do not see any orders in for any procedures since April and that has been done. Please check with Dr Marcelino and advise patient. Thank you

## 2024-07-13 ENCOUNTER — Encounter: Payer: Self-pay | Admitting: Student in an Organized Health Care Education/Training Program

## 2024-07-13 ENCOUNTER — Ambulatory Visit
Admission: RE | Admit: 2024-07-13 | Discharge: 2024-07-13 | Disposition: A | Discharge status: Home / Self Care | Source: Ambulatory Visit | Attending: Student in an Organized Health Care Education/Training Program | Admitting: Student in an Organized Health Care Education/Training Program

## 2024-07-13 ENCOUNTER — Ambulatory Visit (HOSPITAL_BASED_OUTPATIENT_CLINIC_OR_DEPARTMENT_OTHER): Admitting: Student in an Organized Health Care Education/Training Program

## 2024-07-13 DIAGNOSIS — M47816 Spondylosis without myelopathy or radiculopathy, lumbar region: Secondary | ICD-10-CM | POA: Insufficient documentation

## 2024-07-13 MED ORDER — ROPIVACAINE HCL 2 MG/ML IJ SOLN
INTRAMUSCULAR | Status: AC
  Filled 2024-07-13: qty 20

## 2024-07-13 MED ORDER — MIDAZOLAM HCL 5 MG/5ML IJ SOLN
INTRAMUSCULAR | Status: AC
  Filled 2024-07-13: qty 5

## 2024-07-13 MED ORDER — DEXAMETHASONE SODIUM PHOSPHATE 10 MG/ML IJ SOLN
INTRAMUSCULAR | Status: AC
Start: 2024-07-13 — End: 2024-07-13
  Filled 2024-07-13: qty 2

## 2024-07-13 MED ORDER — FENTANYL CITRATE (PF) 100 MCG/2ML IJ SOLN
INTRAMUSCULAR | Status: AC
  Filled 2024-07-13: qty 2

## 2024-07-13 MED ORDER — ROPIVACAINE HCL 2 MG/ML IJ SOLN
18.0000 mL | Freq: Once | INTRAMUSCULAR | Status: AC
  Administered 2024-07-13: 18 mL via PERINEURAL

## 2024-07-13 MED ORDER — LIDOCAINE HCL 2 % IJ SOLN
20.0000 mL | Freq: Once | INTRAMUSCULAR | Status: AC
  Administered 2024-07-13: 400 mg

## 2024-07-13 MED ORDER — LACTATED RINGERS IV SOLN: Freq: Once | INTRAVENOUS | Status: AC

## 2024-07-13 MED ORDER — MIDAZOLAM HCL 5 MG/5ML IJ SOLN
0.5000 mg | Freq: Once | INTRAMUSCULAR | Status: AC
  Administered 2024-07-13: 2 mg via INTRAVENOUS

## 2024-07-13 MED ORDER — FENTANYL CITRATE (PF) 100 MCG/2ML IJ SOLN
25.0000 ug | INTRAMUSCULAR | Status: AC | PRN
  Administered 2024-07-13: 25 ug via INTRAVENOUS
  Administered 2024-07-13: 75 ug via INTRAVENOUS

## 2024-07-13 MED ORDER — DEXAMETHASONE SODIUM PHOSPHATE 10 MG/ML IJ SOLN
20.0000 mg | Freq: Once | INTRAMUSCULAR | Status: AC
  Administered 2024-07-13: 20 mg

## 2024-07-13 MED ORDER — IOPAMIDOL (ISOVUE-M 200) INJECTION 41%: 10.0000 mL | Freq: Once | INTRAMUSCULAR | Status: DC

## 2024-07-13 MED ORDER — LIDOCAINE HCL 2 % IJ SOLN
INTRAMUSCULAR | Status: AC
  Filled 2024-07-13: qty 20

## 2024-07-13 NOTE — Progress Notes (Signed)
 Safety precautions to be maintained throughout the outpatient stay will include: orient to surroundings, keep bed in low position, maintain call bell within reach at all times, provide assistance with transfer out of bed and ambulation.

## 2024-07-13 NOTE — Progress Notes (Signed)
 PROVIDER NOTE: Interpretation of information contained herein should be left to medically-trained personnel. Specific patient instructions are provided elsewhere under Patient Instructions section of medical record. This document was created in part using STT-dictation technology, any transcriptional errors that may result from this process are unintentional.  Patient: Valerie Welch Type: Established DOB: 1971/03/12 MRN: [id number] PCP: Sadie Manna, MD  Service: Procedure DOS: 07/13/2024 Setting: Ambulatory Location: Ambulatory outpatient facility Delivery: Face-to-face Provider: Wallie Sherry, MD Specialty: Interventional Pain Management Specialty designation: 09 Location: Outpatient facility Ref. Prov.: Sadie Manna, MD       Interventional Therapy   Procedure: Lumbar Facet, Medial Branch Radiofrequency Ablation (RFA) #3  Laterality: Bilateral (-50)  Level: L3, L4, and L5 Medial Branch Level(s). These levels will denervate the L3-4 and L4-5 lumbar facet joints.  Imaging: Fluoroscopy-guided         Anesthesia: Local anesthesia (1-2% Lidocaine ) Moderate Sedation DOS: 07/13/2024  Performed by: Wallie Sherry, MD  Purpose: Therapeutic/Palliative Indications: Low back pain severe enough to impact quality of life or function. Indications: 1. Lumbar spondylosis   2. Lumbar facet arthropathy    Valerie Welch has been dealing with the above chronic pain for longer than three months and has either failed to respond, was unable to tolerate, or simply did not get enough benefit from other more conservative therapies including, but not limited to: 1. Over-the-counter medications 2. Anti-inflammatory medications 3. Muscle relaxants 4. Membrane stabilizers 5. Opioids 6. Physical therapy and/or chiropractic manipulation 7. Modalities (Heat, ice, etc.) 8. Invasive techniques such as nerve blocks. Valerie Welch has attained more than 50% relief of the pain from a series  of diagnostic injections conducted in separate occasions.  Pain Score: Pre-procedure: 5 /10 Post-procedure: 0-No pain/10     Position / Prep / Materials:  Position: Prone  Prep solution: DuraPrep (Iodine  Povacrylex [0.7% available iodine ] and Isopropyl Alcohol, 74% w/w) Prep Area: Entire Lumbosacral Region (Lower back from mid-thoracic region to end of tailbone and from flank to flank.) Materials:  Tray: RFA (Radiofrequency) tray Needle(s):  Type: RFA (Teflon-coated radiofrequency ablation needles) Gauge (G): 22  Length: Regular (10cm) Qty: 3      Pre-op H&P Assessment:  Valerie Welch is a 53 y.o. (year old), female patient, seen today for interventional treatment. She  has a past surgical history that includes Cesarean section; Cholecystectomy, laparoscopic; Ankle arthroscopy (Right, 09/26/2018); Tenotomy / flexor tendon transfer (Right, 09/26/2018); Arthrodesis metatarsalphalangeal joint (mtpj) (Right, 09/26/2018); Exostectectomy toe (Right, 09/26/2018); Cholecystectomy; Colonoscopy with propofol  (N/A, 06/16/2020); Esophagogastroduodenoscopy (egd) with propofol  (N/A, 06/16/2020); Robotic assisted laparoscopic hysterectomy and salpingectomy (N/A, 07/05/2020); Colonoscopy with propofol  (N/A, 02/15/2023); Esophagogastroduodenoscopy (egd) with propofol  (N/A, 02/15/2023); Hiatal hernia repair (09/16/2023); and Colonoscopy with propofol  (N/A, 12/06/2023). Valerie Welch has a current medication list which includes the following prescription(s): albuterol , alprazolam, aripiprazole, ascorbic acid, ashwagandha, azelastine, bepreve, bupropion, cetirizine, b-12, diclofenac  sodium, dss, duloxetine , estradiol, fluticasone-salmeterol, fluticasone-umeclidin-vilant, gabapentin , hydrocodone -acetaminophen , hydrocodone -acetaminophen , iron polysaccharides, levothyroxine, montelukast , multivitamin with minerals, NON FORMULARY, pantoprazole, polyethylene glycol, promethazine, pseudoephedrine-guaifenesin,  tinidazole , zepbound, trolamine salicylate, amlodipine, and rosuvastatin, and the following Facility-Administered Medications: iopamidol . Her primarily concern today is the Back Pain (Bilateral lumbar )  Initial Vital Signs:  Pulse/HCG Rate: 81ECG Heart Rate: 80 Temp: (!) 97.3 F (36.3 C) Resp: 18 BP: (!) 139/107 SpO2: 100 %  BMI: Estimated body mass index is 34.77 kg/m as calculated from the following:   Height as of this encounter: 5' 1 (1.549 m).   Weight as of this encounter: 184 lb (83.5 kg).  Risk Assessment: Allergies:  Reviewed. She has no known allergies.  Allergy Precautions: None required Coagulopathies: Reviewed. None identified.  Blood-thinner therapy: None at this time Active Infection(s): Reviewed. None identified. Valerie Welch is afebrile  Site Confirmation: Valerie Welch was asked to confirm the procedure and laterality before marking the site Procedure checklist: Completed Consent: Before the procedure and under the influence of no sedative(s), amnesic(s), or anxiolytics, the patient was informed of the treatment options, risks and possible complications. To fulfill our ethical and legal obligations, as recommended by the American Medical Association's Code of Ethics, I have informed the patient of my clinical impression; the nature and purpose of the treatment or procedure; the risks, benefits, and possible complications of the intervention; the alternatives, including doing nothing; the risk(s) and benefit(s) of the alternative treatment(s) or procedure(s); and the risk(s) and benefit(s) of doing nothing. The patient was provided information about the general risks and possible complications associated with the procedure. These may include, but are not limited to: failure to achieve desired goals, infection, bleeding, organ or nerve damage, allergic reactions, paralysis, and death. In addition, the patient was informed of those risks and complications associated  to Spine-related procedures, such as failure to decrease pain; infection (i.e.: Meningitis, epidural or intraspinal abscess); bleeding (i.e.: epidural hematoma, subarachnoid hemorrhage, or any other type of intraspinal or peri-dural bleeding); organ or nerve damage (i.e.: Any type of peripheral nerve, nerve root, or spinal cord injury) with subsequent damage to sensory, motor, and/or autonomic systems, resulting in permanent pain, numbness, and/or weakness of one or several areas of the body; allergic reactions; (i.e.: anaphylactic reaction); and/or death. Furthermore, the patient was informed of those risks and complications associated with the medications. These include, but are not limited to: allergic reactions (i.e.: anaphylactic or anaphylactoid reaction(s)); adrenal axis suppression; blood sugar elevation that in diabetics may result in ketoacidosis or comma; water retention that in patients with history of congestive heart failure may result in shortness of breath, pulmonary edema, and decompensation with resultant heart failure; weight gain; swelling or edema; medication-induced neural toxicity; particulate matter embolism and blood vessel occlusion with resultant organ, and/or nervous system infarction; and/or aseptic necrosis of one or more joints. Finally, the patient was informed that Medicine is not an exact science; therefore, there is also the possibility of unforeseen or unpredictable risks and/or possible complications that may result in a catastrophic outcome. The patient indicated having understood very clearly. We have given the patient no guarantees and we have made no promises. Enough time was given to the patient to ask questions, all of which were answered to the patient's satisfaction. Ms. Bhargava has indicated that she wanted to continue with the procedure. Attestation: I, the ordering provider, attest that I have discussed with the patient the benefits, risks, side-effects,  alternatives, likelihood of achieving goals, and potential problems during recovery for the procedure that I have provided informed consent. Date  Time: 07/13/2024  8:13 AM   Pre-Procedure Preparation:  Monitoring: As per clinic protocol. Respiration, ETCO2, SpO2, BP, heart rate and rhythm monitor placed and checked for adequate function Safety Precautions: Patient was assessed for positional comfort and pressure points before starting the procedure. Time-out: I initiated and conducted the Time-out before starting the procedure, as per protocol. The patient was asked to participate by confirming the accuracy of the Time Out information. Verification of the correct person, site, and procedure were performed and confirmed by me, the nursing staff, and the patient. Time-out conducted as per Joint Commission's Universal Protocol (UP.01.01.01). Time:  0845 Start Time: 0845 hrs.  Description of Procedure:          Laterality: See above. Levels:  See above. Safety Precautions: Aspiration looking for blood return was conducted prior to all injections. At no point did we inject any substances, as a needle was being advanced. Before injecting, the patient was told to immediately notify me if she was experiencing any new onset of ringing in the ears, or metallic taste in the mouth. No attempts were made at seeking any paresthesias. Safe injection practices and needle disposal techniques used. Medications properly checked for expiration dates. SDV (single dose vial) medications used. After the completion of the procedure, all disposable equipment used was discarded in the proper designated medical waste containers. Local Anesthesia: Protocol guidelines were followed. The patient was positioned over the fluoroscopy table. The area was prepped in the usual manner. The time-out was completed. The target area was identified using fluoroscopy. A 12-in long, straight, sterile hemostat was used with fluoroscopic  guidance to locate the targets for each level blocked. Once located, the skin was marked with an approved surgical skin marker. Once all sites were marked, the skin (epidermis, dermis, and hypodermis), as well as deeper tissues (fat, connective tissue and muscle) were infiltrated with a small amount of a short-acting local anesthetic, loaded on a 10cc syringe with a 25G, 1.5-in  Needle. An appropriate amount of time was allowed for local anesthetics to take effect before proceeding to the next step. Technical description of process:   Radiofrequency Ablation (RFA) L3 Medial Branch Nerve RFA: The target area for the L3 medial branch is at the junction of the postero-lateral aspect of the superior articular process and the superior, posterior, and medial edge of the transverse process of L4. Under fluoroscopic guidance, a Radiofrequency needle was inserted until contact was made with os over the superior postero-lateral aspect of the pedicular shadow (target area). Sensory and motor testing was conducted to properly adjust the position of the needle. Once satisfactory placement of the needle was achieved, the numbing solution was slowly injected after negative aspiration for blood. 2.0 mL of the nerve block solution was injected without difficulty or complication. After waiting for at least 3 minutes, the ablation was performed. Once completed, the needle was removed intact. L4 Medial Branch Nerve RFA: The target area for the L4 medial branch is at the junction of the postero-lateral aspect of the superior articular process and the superior, posterior, and medial edge of the transverse process of L5. Under fluoroscopic guidance, a Radiofrequency needle was inserted until contact was made with os over the superior postero-lateral aspect of the pedicular shadow (target area). Sensory and motor testing was conducted to properly adjust the position of the needle. Once satisfactory placement of the needle was  achieved, the numbing solution was slowly injected after negative aspiration for blood. 2.0 mL of the nerve block solution was injected without difficulty or complication. After waiting for at least 3 minutes, the ablation was performed. Once completed, the needle was removed intact. L5 Medial Branch Nerve RFA: The target area for the L5 medial branch is at the junction of the postero-lateral aspect of the superior articular process of S1 and the superior, posterior, and medial edge of the sacral ala. Under fluoroscopic guidance, a Radiofrequency needle was inserted until contact was made with os over the superior postero-lateral aspect of the pedicular shadow (target area). Sensory and motor testing was conducted to properly adjust the position of the needle. Once satisfactory  placement of the needle was achieved, the numbing solution was slowly injected after negative aspiration for blood. 2.0 mL of the nerve block solution was injected without difficulty or complication. After waiting for at least 3 minutes, the ablation was performed. Once completed, the needle was removed intact.  Radiofrequency lesioning (ablation):  Radiofrequency Generator: Medtronic AccurianTM AG 1000 RF Generator Sensory Stimulation Parameters: 50 Hz was used to locate & identify the nerve, making sure that the needle was positioned such that there was no sensory stimulation below 0.3 V or above 0.7 V. Motor Stimulation Parameters: 2 Hz was used to evaluate the motor component. Care was taken not to lesion any nerves that demonstrated motor stimulation of the lower extremities at an output of less than 2.5 times that of the sensory threshold, or a maximum of 2.0 V. Lesioning Technique Parameters: Standard Radiofrequency settings. (Not bipolar or pulsed.) Temperature Settings: 80 degrees C Lesioning time: 60 seconds Intra-operative Compliance: Compliant  Once the entire procedure was completed, the treated area was cleaned,  making sure to leave some of the prepping solution back to take advantage of its long term bactericidal properties.    Illustration of the posterior view of the lumbar spine and the posterior neural structures. Laminae of L2 through S1 are labeled. DPRL5, dorsal primary ramus of L5; DPRS1, dorsal primary ramus of S1; DPR3, dorsal primary ramus of L3; FJ, facet (zygapophyseal) joint L3-L4; I, inferior articular process of L4; LB1, lateral branch of dorsal primary ramus of L1; IAB, inferior articular branches from L3 medial branch (supplies L4-L5 facet joint); IBP, intermediate branch plexus; MB3, medial branch of dorsal primary ramus of L3; NR3, third lumbar nerve root; S, superior articular process of L5; SAB, superior articular branches from L4 (supplies L4-5 facet joint also); TP3, transverse process of L3.  Facet Joint Innervation (* possible contribution)  L1-2 T12, L1 (L2*)  Medial Branch  L2-3 L1, L2 (L3*)                     L3-4 L2, L3 (L4*)                     L4-5 L3, L4 (L5*)                     L5-S1 L4, L5, S1                        Vitals:   07/13/24 0915 07/13/24 0922 07/13/24 0931 07/13/24 0944  BP: 123/76 123/79 118/82 130/86  Pulse:      Resp: 16 13 (!) 22 15  Temp:  97.9 F (36.6 C)  98 F (36.7 C)  TempSrc:      SpO2: 100% 100% 99% 97%  Weight:      Height:        Start Time: 0845 hrs. End Time: 0905 hrs.  Imaging Guidance (Spinal):          Type of Imaging Technique: Fluoroscopy Guidance (Spinal) Indication(s): Assistance in needle guidance and placement for procedures requiring needle placement in or near specific anatomical locations not easily accessible without such assistance. Exposure Time: Please see nurses notes. Contrast: None used. Fluoroscopic Guidance: I was personally present during the use of fluoroscopy. Tunnel Vision Technique used to obtain the best possible view of the target area. Parallax error corrected before commencing the  procedure. Direction-depth-direction technique used to introduce the needle under continuous pulsed fluoroscopy. Once  target was reached, antero-posterior, oblique, and lateral fluoroscopic projection used confirm needle placement in all planes. Images permanently stored in EMR. Interpretation: No contrast injected. I personally interpreted the imaging intraoperatively. Adequate needle placement confirmed in multiple planes. Permanent images saved into the patient's record.  Antibiotic Prophylaxis:   Anti-infectives (From admission, onward)    None      Indication(s): None identified  Post-operative Assessment:  Post-procedure Vital Signs:  Pulse/HCG Rate: 8083 Temp: 98 F (36.7 C) Resp: 15 BP: 130/86 SpO2: 97 %  EBL: None  Complications: No immediate post-treatment complications observed by team, or reported by patient.  Note: The patient tolerated the entire procedure well. A repeat set of vitals were taken after the procedure and the patient was kept under observation following institutional policy, for this type of procedure. Post-procedural neurological assessment was performed, showing return to baseline, prior to discharge. The patient was provided with post-procedure discharge instructions, including a section on how to identify potential problems. Should any problems arise concerning this procedure, the patient was given instructions to immediately contact us , at any time, without hesitation. In any case, we plan to contact the patient by telephone for a follow-up status report regarding this interventional procedure.  Comments:  No additional relevant information.  Plan of Care (POC)  Orders:  Orders Placed This Encounter  Procedures   DG PAIN CLIN[facility] C-ARM 1-60 MIN NO REPORT    Intraoperative interpretation by procedural physician at [facility].    Standing Status:   Standing    Number of Occurrences:   1    Reason for exam::   Assistance in needle  guidance and placement for procedures requiring needle placement in or near specific anatomical locations not easily accessible without such assistance.     Medications ordered for procedure: Meds ordered this encounter  Medications   iopamidol  (ISOVUE -M) 41 % intrathecal injection 10 mL    Must be Myelogram-compatible. If not available, you may substitute with a water-soluble, non-ionic, hypoallergenic, myelogram-compatible radiological contrast medium.   lidocaine  (XYLOCAINE ) 2 % (with pres) injection 400 mg   lactated ringers  infusion   midazolam  (VERSED ) 5 MG/5ML injection 0.5-2 mg    Make sure Flumazenil is available in the pyxis when using this medication. If oversedation occurs, administer 0.2 mg IV over 15 sec. If after 45 sec no response, administer 0.2 mg again over 1 min; may repeat at 1 min intervals; not to exceed 4 doses (1 mg)   fentaNYL  (SUBLIMAZE ) injection 25-50 mcg    Make sure Narcan is available in the pyxis when using this medication. In the event of respiratory depression (RR< 8/min): Titrate NARCAN (naloxone) in increments of 0.1 to 0.2 mg IV at 2-3 minute intervals, until desired degree of reversal.   ropivacaine  (PF) 2 mg/mL (0.2%) (NAROPIN ) injection 18 mL   dexamethasone  (DECADRON ) injection 20 mg   Medications administered: We administered lidocaine , lactated ringers , midazolam , fentaNYL , ropivacaine  (PF) 2 mg/mL (0.2%), and dexamethasone .  See the medical record for exact dosing, route, and time of administration.  Follow-up plan:   Return for Keep sch. appt.       Recent Visits Date Type Provider Dept  06/09/24 Office Visit Patel, Seema K, NP [facility]  Showing recent visits within past 90 days and meeting all other requirements Today's Visits Date Type Provider Dept  07/13/24 Procedure visit Marcelino Nurse, MD [facility]  Showing today's visits and meeting all other requirements Future Appointments No visits were found meeting  these conditions. Showing future appointments within next 90 days and meeting all other requirements  Disposition: Discharge home  Discharge (Date  Time): 07/13/2024; 0946 hrs.   Primary Care Physician: Sadie Manna, MD Location: [facility] Note by: Wallie Sherry, MD (TTS technology used. I apologize for any typographical errors that were not detected and corrected.) Date: 07/13/2024; Time: 10:01 AM  Disclaimer:  Medicine is not an Visual merchandiser. The only guarantee in medicine is that nothing is guaranteed. It is important to note that the decision to proceed with this intervention was based on the information collected from the patient. The Data and conclusions were drawn from the patient's questionnaire, the interview, and the physical examination. Because the information was provided in large part by the patient, it cannot be guaranteed that it has not been purposely or unconsciously manipulated. Every effort has been made to obtain as much relevant data as possible for this evaluation. It is important to note that the conclusions that lead to this procedure are derived in large part from the available data. Always take into account that the treatment will also be dependent on availability of resources and existing treatment guidelines, considered by other Pain Management Practitioners as being common knowledge and practice, at the time of the intervention. For Medico-Legal purposes, it is also important to point out that variation in procedural techniques and pharmacological choices are the acceptable norm. The indications, contraindications, technique, and results of the above procedure should only be interpreted and judged by a Board-Certified Interventional Pain Specialist with extensive familiarity and expertise in the same exact procedure and technique.

## 2024-07-13 NOTE — Patient Instructions (Signed)

## 2024-07-13 NOTE — Progress Notes (Signed)
 Patient sweaty when moved to bed, BP  good, talking when asked

## 2024-07-14 ENCOUNTER — Telehealth: Payer: Self-pay | Admitting: *Deleted

## 2024-07-14 NOTE — Telephone Encounter (Signed)
 No problems post procedure.

## 2024-10-05 ENCOUNTER — Other Ambulatory Visit: Payer: Self-pay | Admitting: Surgery

## 2024-10-05 DIAGNOSIS — M1711 Unilateral primary osteoarthritis, right knee: Secondary | ICD-10-CM

## 2024-10-13 ENCOUNTER — Ambulatory Visit
Admission: RE | Admit: 2024-10-13 | Discharge: 2024-10-13 | Disposition: A | Discharge status: Home / Self Care | Source: Ambulatory Visit | Attending: Surgery | Admitting: Surgery

## 2024-10-13 DIAGNOSIS — M1711 Unilateral primary osteoarthritis, right knee: Secondary | ICD-10-CM

## 2024-10-27 ENCOUNTER — Other Ambulatory Visit: Payer: Self-pay | Admitting: Otolaryngology

## 2024-10-27 DIAGNOSIS — K118 Other diseases of salivary glands: Secondary | ICD-10-CM

## 2024-11-05 ENCOUNTER — Ambulatory Visit
Admission: RE | Admit: 2024-11-05 | Discharge: 2024-11-05 | Disposition: A | Discharge status: Home / Self Care | Source: Ambulatory Visit | Attending: Otolaryngology | Admitting: Otolaryngology

## 2024-11-05 DIAGNOSIS — K118 Other diseases of salivary glands: Secondary | ICD-10-CM

## 2024-12-08 DIAGNOSIS — E041 Nontoxic single thyroid nodule: Secondary | ICD-10-CM | POA: Insufficient documentation

## 2024-12-08 DIAGNOSIS — J45909 Unspecified asthma, uncomplicated: Secondary | ICD-10-CM | POA: Insufficient documentation

## 2024-12-08 DIAGNOSIS — H1045 Other chronic allergic conjunctivitis: Secondary | ICD-10-CM | POA: Insufficient documentation

## 2024-12-08 DIAGNOSIS — J4540 Moderate persistent asthma, uncomplicated: Secondary | ICD-10-CM | POA: Insufficient documentation

## 2024-12-08 DIAGNOSIS — J300 Vasomotor rhinitis: Secondary | ICD-10-CM | POA: Insufficient documentation

## 2024-12-09 ENCOUNTER — Ambulatory Visit: Attending: Nurse Practitioner | Admitting: Nurse Practitioner

## 2024-12-09 ENCOUNTER — Encounter: Payer: Self-pay | Admitting: Nurse Practitioner

## 2024-12-09 VITALS — BP 144/108 | HR 100 | Temp 97.3°F | Resp 18 | Ht 61.0 in | Wt 170.0 lb

## 2024-12-09 DIAGNOSIS — M792 Neuralgia and neuritis, unspecified: Secondary | ICD-10-CM | POA: Diagnosis present

## 2024-12-09 DIAGNOSIS — M1612 Unilateral primary osteoarthritis, left hip: Secondary | ICD-10-CM | POA: Insufficient documentation

## 2024-12-09 DIAGNOSIS — M160 Bilateral primary osteoarthritis of hip: Secondary | ICD-10-CM

## 2024-12-09 DIAGNOSIS — G894 Chronic pain syndrome: Secondary | ICD-10-CM | POA: Insufficient documentation

## 2024-12-09 DIAGNOSIS — M48061 Spinal stenosis, lumbar region without neurogenic claudication: Secondary | ICD-10-CM | POA: Insufficient documentation

## 2024-12-09 DIAGNOSIS — M47816 Spondylosis without myelopathy or radiculopathy, lumbar region: Secondary | ICD-10-CM | POA: Diagnosis present

## 2024-12-09 DIAGNOSIS — G5793 Unspecified mononeuropathy of bilateral lower limbs: Secondary | ICD-10-CM | POA: Insufficient documentation

## 2024-12-09 DIAGNOSIS — M1611 Unilateral primary osteoarthritis, right hip: Secondary | ICD-10-CM | POA: Diagnosis present

## 2024-12-09 DIAGNOSIS — M5416 Radiculopathy, lumbar region: Secondary | ICD-10-CM | POA: Insufficient documentation

## 2024-12-09 DIAGNOSIS — G5771 Causalgia of right lower limb: Secondary | ICD-10-CM | POA: Diagnosis present

## 2024-12-09 MED ORDER — DULOXETINE HCL 60 MG PO CPEP: 60.0000 mg | ORAL_CAPSULE | Freq: Every day | ORAL | 1 refills | Status: AC

## 2024-12-09 MED ORDER — GABAPENTIN 300 MG PO CAPS: ORAL_CAPSULE | ORAL | 5 refills | Status: AC

## 2024-12-09 NOTE — Progress Notes (Signed)
 PROVIDER NOTE: Interpretation of information contained herein should be left to medically-trained personnel. Specific patient instructions are provided elsewhere under Patient Instructions section of medical record. This document was created in part using AI and STT-dictation technology, any transcriptional errors that may result from this process are unintentional.  Patient: Valerie Welch  Service: E/M   PCP: Sadie Manna, MD  DOB: 06/24/1971  DOS: 12/09/2024  Provider: Emmy MARLA Blanch, NP  MRN: [id number]  Delivery: Face-to-face  Specialty: Interventional Pain Management  Type: Established Patient  Setting: Ambulatory outpatient facility  Specialty designation: 09  Referring Prov.: Sadie Manna, MD  Location: Outpatient office facility       History of present illness (HPI) Valerie Welch, a 53 y.o. year old female, is here today because of her Lumbar facet arthropathy [M47.816]. Ms. Sanderford primary complain today is Back Pain  Pertinent problems: Ms. Maclaren has Neuropathic pain of both legs; Complex regional pain syndrome type II of right lower limb; Neuropathic pain of right foot; Arthritis of left hip; Chronic pain syndrome; Lumbar facet arthropathy; Rotator cuff tendinitis, left; Rotator cuff tendinitis, right; Subacromial bursitis of left shoulder joint; Trochanteric bursitis, left hip; Primary osteoarthritis of right knee; Trochanteric bursitis, right hip; and Complex tear of medial meniscus of left knee as current injury on their pertinent problem list.  Pain Assessment: Severity of Chronic pain is reported as a 6 /10. Location: Back Lower/Bilateral hips. Onset: More than a month ago. Quality: Radiating, Stabbing. Timing: Constant. Modifying factor(s): Laying down, pain medication, hot shower. Vitals:  height is 5' 1 (1.549 m) and weight is 170 lb (77.1 kg). Her temporal temperature is 97.3 F (36.3 C) (abnormal). Her blood pressure is 144/108  (abnormal) and her pulse is 100. Her respiration is 18 and oxygen saturation is 99%.  BMI: Estimated body mass index is 32.12 kg/m as calculated from the following:   Height as of this encounter: 5' 1 (1.549 m).   Weight as of this encounter: 170 lb (77.1 kg).  Last encounter: 06/24/2024. Last procedure: 06/24/2024.  Reason for encounter: evaluation for possible interventional PM therapy/treatment and medication management.   Discussed the use of AI scribe software for clinical note transcription with the patient, who gave verbal consent to proceed.  History of Present Illness   Valerie Welch is a 53 year old female who presents with bilateral hip and lower back pain for follow-up after a previous radiofrequency ablation.  She experienced significant pain relief from the last bilateral radiofrequency ablation procedure performed in July 2025, with the effects beginning to diminish around October 2025. She was pain-free during a beach trip in September 2025 but noticed the return of symptoms in October 2025.  The previous procedure significantly alleviated her radiating leg pain, described as 'shooting' and unpredictable. She prefers constant pain over radiating pain. The relief lasted approximately four to five months. Post-procedure, the pain was localized to the hips and lower back, which she finds more manageable than the radiating pain.  The patient is experiencing increased lumbar spine pain related to lumbar facet arthropathy.  She is status post bilateral L3, L4 and L5 radiofrequency ablation (RFA) performed on July 13, 2024, which provided approximately 80% pain relief for about 5 months.  Given the return of her pain, we discussed repeating lumbar radiofrequency ablation (RFA).  The risks and benefits were reviewed, and the patient would like to proceed.   She is currently taking duloxetine  and gabapentin , though she is unsure if she needs  refills.  During the last procedure, she  received moderate sedation with Valium  and possibly Versed , along with local anesthesia. The procedure targeted the L3, L4, and L5 nerves, which she believes helped with her symptoms.     Pharmacotherapy Assessment   Analgesic: Gabapentin  (Neurontin ) 300 mg capsule in the morning, 300 Mg in the afternoon, 600 Mg nightly. Duloxetine  (Cymbalta ) 60 mg 1 capsule daily Monitoring: [place] PMP: PDMP reviewed during this encounter.       Pharmacotherapy: No side-effects or adverse reactions reported. Compliance: No problems identified. Effectiveness: Clinically acceptable.  Erlene Doyal SAUN[place], [place]  12/09/2024 11:23 AM  Sign when Signing Visit Safety precautions to be maintained throughout the outpatient stay will include: orient to surroundings, keep bed in low position, maintain call bell within reach at all times, provide assistance with transfer out of bed and ambulation.     UDS:  No results found for: SUMMARY  No results found for: CBDTHCR No results found for: D8THCCBX No results found for: D9THCCBX  ROS  Constitutional: Denies any fever or chills Gastrointestinal: No reported hemesis, hematochezia, vomiting, or acute GI distress Musculoskeletal: Low back pain (facet mediated pain) Neurological: No reported episodes of acute onset apraxia, aphasia, dysarthria, agnosia, amnesia, paralysis, loss of coordination, or loss of consciousness  Medication Review  ALPRAZolam, ARIPiprazole, Ashwagandha, B-12, Bepotastine Besilate, DSS, DULoxetine , Fluticasone-Salmeterol, Fluticasone-Umeclidin-Vilant, HYDROcodone -Acetaminophen , NON FORMULARY, Pseudoephedrine-guaiFENesin, albuterol , amLODipine, ascorbic acid, azelastine, buPROPion, cetirizine, diclofenac  sodium, estradiol, gabapentin , iron polysaccharides, levothyroxine, montelukast , multivitamin with minerals, pantoprazole, polyethylene glycol, promethazine, roflumilast, rosuvastatin, semaglutide-weight management, tinidazole , and trolamine  salicylate  History Review  Allergy: Ms. Lightsey has no known allergies. Drug: Ms. Vanecek  reports no history of drug use. Alcohol:  reports current alcohol use. Tobacco:  reports that she has never smoked. She has never used smokeless tobacco. Social: Ms. [place]  reports that she has never smoked. She has never used smokeless tobacco. She reports current alcohol use. She reports that she does not use drugs. Medical:  has a past medical history of Anemia, Arthritis, Asthma, Barrett's esophagus, Bursitis, Depression, Fibromyalgia, GERD (gastroesophageal reflux disease), History of hiatal hernia, Hypothyroidism, Nontoxic uninodular goiter, and Pernicious anemia. Surgical: Ms. Vanderstelt  has a past surgical history that includes Cesarean section; Cholecystectomy, laparoscopic; Ankle arthroscopy (Right, 09/26/2018); Tenotomy / flexor tendon transfer (Right, 09/26/2018); Arthrodesis metatarsalphalangeal joint (mtpj) (Right, 09/26/2018); Exostectectomy toe (Right, 09/26/2018); Cholecystectomy; Colonoscopy with propofol  (N/A, 06/16/2020); Esophagogastroduodenoscopy (egd) with propofol  (N/A, 06/16/2020); Robotic assisted laparoscopic hysterectomy and salpingectomy (N/A, 07/05/2020); Colonoscopy with propofol  (N/A, 02/15/2023); Esophagogastroduodenoscopy (egd) with propofol  (N/A, 02/15/2023); Hiatal hernia repair (09/16/2023); and Colonoscopy with propofol  (N/A, 12/06/2023). Family: family history includes Cancer in her paternal grandfather; Diabetes in her mother; Heart disease in her mother; Hypertension in her maternal grandfather; Stroke in her brother, father, and mother.  Laboratory Chemistry Profile   Renal Lab Results  Component Value Date   BUN 7 07/01/2020   CREATININE 1.00 07/01/2020   GFRAA >60 07/01/2020   GFRNONAA >60 07/01/2020    Hepatic No results found for: AST, ALT, ALBUMIN , ALKPHOS, HCVAB, AMYLASE, LIPASE, AMMONIA  Electrolytes Lab Results   Component Value Date   NA 139 07/01/2020   K 4.5 07/01/2020   CL 108 07/01/2020   CALCIUM 9.2 07/01/2020    Bone No results found for: VD25OH, VD125OH2TOT, CI6874NY7, CI7874NY7, 25OHVITD1, 25OHVITD2, 25OHVITD3, TESTOFREE, TESTOSTERONE  Inflammation (CRP: Acute Phase) (ESR: Chronic Phase) No results found for: CRP, ESRSEDRATE, LATICACIDVEN       Note: Above Lab results reviewed.  Recent Imaging  Review  US  SOFT TISSUE HEAD & NECK (NON-THYROID ) EXAM: US  Bilateral Submandibular Soft Tissues Nonvascular Soft Tissue Ultrasound 11/05/2024 09:53:00 AM  TECHNIQUE: Grayscale and brief color doppler images of the bilateral submandibular soft tissues with image documentation.  COMPARISON: Neck CT 11/20/2013.  CLINICAL HISTORY: 53 year old female with right submandibular region swelling.  FINDINGS:  SOFT TISSUES: The submandibular glands appear symmetric and within normal limits. No mass or lymphadenopathy. Visible fibromuscular architecture appears symmetric and within normal limits.  IMPRESSION: 1. No submandibular region abnormality by ultrasound.  Electronically signed by: Helayne Hurst MD 11/11/2024 06:34 AM EST RP Workstation: [id number] Note: Reviewed        Physical Exam  Vitals: BP (!) 144/108 (BP Location: Right Arm, Patient Position: Sitting, Cuff Size: Normal)   Pulse 100   Temp (!) 97.3 F (36.3 C) (Temporal)   Resp 18   Ht 5' 1 (1.549 m)   Wt 170 lb (77.1 kg)   LMP 09/10/2019 Comment: sexually active  SpO2 99%   BMI 32.12 kg/m  BMI: Estimated body mass index is 32.12 kg/m as calculated from the following:   Height as of this encounter: 5' 1 (1.549 m).   Weight as of this encounter: 170 lb (77.1 kg). Ideal: Ideal body weight: 47.8 kg (105 lb 6.1 oz) Adjusted ideal body weight: 59.5 kg (131 lb 3.6 oz) General appearance: Well nourished, well developed, and well hydrated. In no apparent acute distress Mental status: Alert, oriented  x 3 (person, place, & time)       Respiratory: No evidence of acute respiratory distress Eyes: PERLA  Musculoskeletal: +LBP (facet loading) Lumbar Exam  Skin & Axial Inspection: No masses, redness, or swelling Alignment: Symmetrical Functional ROM: Pain restricted ROM affecting both sides Stability: No instability detected Muscle Tone/Strength: Functionally intact. No obvious neuro-muscular anomalies detected. Sensory (Neurological): Facet-mediated pain Palpation: No palpable anomalies       Provocative Tests: Hyperextension/rotation test: (+) bilaterally for facet joint pain. Lumbar quadrant test (Kemp's test): (+) bilaterally for facet joint pain. Lateral bending test: (+) due to pain.  Diagnosis Status  1. Lumbar facet arthropathy   2. Lumbar spondylosis   3. Chronic pain syndrome   4. Lumbar radiculopathy   5. Complex regional pain syndrome type II of right lower limb   6. Neuropathic pain of both legs   7. Neuropathic pain of right foot   8. Arthritis of left hip   9. Arthritis of right hip   10. Foraminal stenosis of lumbar region    Having a Flare-up Having a Flare-up Controlled   Updated Problems: No problems updated.  Plan of Care  Problem-specific:  Assessment and Plan    Chronic lumbar radiculopathy with spondylosis and facet arthropathy Previously treated with radiofrequency ablation in July 2025, providing 4-5 months of symptom relief. Pain localized to hips and lower back post-effect. Previous ablation at L3-L4 and L4-L5 relieved radiating leg pain. - Schedule radiofrequency ablation for January, targeting L3-L4 and L4-L5. - Use moderate sedation with Valium  and Versed .  Chronic pain syndrome Managed with duloxetine  and gabapentin . - Sent duloxetine  prescription with one refill for 90 capsules to pharmacy. - Sent gabapentin  prescription with five refills to pharmacy.  Neuropathic pain of both legs Previously managed with radiofrequency ablation, which  improved radiating pain. Current pain localized to hips and lower back. - Proceed with radiofrequency ablation as planned for chronic lumbar radiculopathy.   Plan: (ECT): (B/L) L-RFA # 4 (with moderate sedation) Dr. Lateef  Ms. TRISHELLE DEVORA has a current medication list which includes the following long-term medication(s): aripiprazole, azelastine, cetirizine, estradiol, iron polysaccharides, levothyroxine, montelukast , pantoprazole, promethazine, amlodipine, duloxetine , gabapentin , and roflumilast.  Pharmacotherapy (Medications Ordered): Meds ordered this encounter  Medications   DULoxetine  (CYMBALTA ) 60 MG capsule    Sig: Take 1 capsule (60 mg total) by mouth daily.    Dispense:  90 capsule    Refill:  1   gabapentin  (NEURONTIN ) 300 MG capsule    Sig: 300 mg in the morning, 300 mg in the afternoon, 600 mg nightly    Dispense:  120 capsule    Refill:  5   Orders:  Orders Placed This Encounter  Procedures   Radiofrequency,Lumbar    Standing Status:   Future    Expiration Date:   03/09/2025    Scheduling Instructions:     Side(s): Bilateral (-50)     Level: L3-4 and L4-5 Facets (L3, L4, and L5 Medial Branch)     Sedation: With Sedation. (IV versed ) - Moderate sedation     Timeframe: As soon as schedule allows.    Where will this procedure be performed?:   [facility]        Return in about 4 weeks (around 01/06/2025) for (ECT): (B/L) L-RFA # 4 (with moderate sedation) Dr. Marcelino .    Recent Visits No visits were found meeting these conditions. Showing recent visits within past 90 days and meeting all other requirements Today's Visits Date Type Provider Dept  12/09/24 Office Visit Chibuike Fleek K, NP [facility]  Showing today's visits and meeting all other requirements Future Appointments No visits were found meeting these conditions. Showing future appointments within next 90 days and meeting all other requirements  I discussed the  assessment and treatment plan with the patient. The patient was provided an opportunity to ask questions and all were answered. The patient agreed with the plan and demonstrated an understanding of the instructions.  Patient advised to call back or seek an in-person evaluation if the symptoms or condition worsens.  I personally spent a total of 30 minutes in the care of the patient today including preparing to see the patient, getting/reviewing separately obtained history, performing a medically appropriate exam/evaluation, counseling and educating, placing orders, referring and communicating with other health care professionals, documenting clinical information in the EHR, independently interpreting results, communicating results, and coordinating care.   Note by: Kaybree Williams K Dakari Stabler, NP (TTS and AI technology used. I apologize for any typographical errors that were not detected and corrected.) Date: 12/09/2024; Time: 1:11 PM

## 2024-12-09 NOTE — Progress Notes (Signed)
 Safety precautions to be maintained throughout the outpatient stay will include: orient to surroundings, keep bed in low position, maintain call bell within reach at all times, provide assistance with transfer out of bed and ambulation.

## 2025-01-13 ENCOUNTER — Encounter: Payer: Self-pay | Admitting: Student in an Organized Health Care Education/Training Program

## 2025-01-13 ENCOUNTER — Ambulatory Visit
Admission: RE | Admit: 2025-01-13 | Discharge: 2025-01-13 | Disposition: A | Discharge status: Home / Self Care | Source: Ambulatory Visit | Attending: Student in an Organized Health Care Education/Training Program | Admitting: Student in an Organized Health Care Education/Training Program

## 2025-01-13 ENCOUNTER — Ambulatory Visit: Admitting: Student in an Organized Health Care Education/Training Program

## 2025-01-13 DIAGNOSIS — M47816 Spondylosis without myelopathy or radiculopathy, lumbar region: Secondary | ICD-10-CM | POA: Diagnosis not present

## 2025-01-13 MED ORDER — MIDAZOLAM HCL (PF) 2 MG/2ML IJ SOLN
0.5000 mg | Freq: Once | INTRAMUSCULAR | Status: AC
  Administered 2025-01-13: 2 mg via INTRAVENOUS

## 2025-01-13 MED ORDER — LIDOCAINE HCL 2 % IJ SOLN
20.0000 mL | Freq: Once | INTRAMUSCULAR | Status: AC
  Administered 2025-01-13: 400 mg

## 2025-01-13 MED ORDER — DEXAMETHASONE SOD PHOSPHATE PF 10 MG/ML IJ SOLN
INTRAMUSCULAR | Status: AC
  Filled 2025-01-13: qty 2

## 2025-01-13 MED ORDER — LIDOCAINE HCL 2 % IJ SOLN
INTRAMUSCULAR | Status: AC
  Filled 2025-01-13: qty 20

## 2025-01-13 MED ORDER — DEXAMETHASONE SOD PHOSPHATE PF 10 MG/ML IJ SOLN
20.0000 mg | Freq: Once | INTRAMUSCULAR | Status: AC
  Administered 2025-01-13: 20 mg

## 2025-01-13 MED ORDER — LACTATED RINGERS IV SOLN: Freq: Once | INTRAVENOUS | Status: AC

## 2025-01-13 MED ORDER — FENTANYL CITRATE (PF) 100 MCG/2ML IJ SOLN
25.0000 ug | INTRAMUSCULAR | Status: AC | PRN
  Administered 2025-01-13 (×2): 25 ug via INTRAVENOUS
  Administered 2025-01-13: 50 ug via INTRAVENOUS

## 2025-01-13 MED ORDER — ROPIVACAINE HCL 2 MG/ML IJ SOLN
18.0000 mL | Freq: Once | INTRAMUSCULAR | Status: AC
  Administered 2025-01-13: 18 mL via PERINEURAL

## 2025-01-13 MED ORDER — MIDAZOLAM HCL 2 MG/2ML IJ SOLN
INTRAMUSCULAR | Status: AC
  Filled 2025-01-13: qty 2

## 2025-01-13 MED ORDER — FENTANYL CITRATE (PF) 100 MCG/2ML IJ SOLN
INTRAMUSCULAR | Status: AC
  Filled 2025-01-13: qty 2

## 2025-01-13 MED ORDER — ROPIVACAINE HCL 2 MG/ML IJ SOLN
INTRAMUSCULAR | Status: AC
  Filled 2025-01-13: qty 20

## 2025-01-13 NOTE — Patient Instructions (Signed)

## 2025-01-13 NOTE — Progress Notes (Signed)
 Safety precautions to be maintained throughout the outpatient stay will include: orient to surroundings, keep bed in low position, maintain call bell within reach at all times, provide assistance with transfer out of bed and ambulation.

## 2025-01-13 NOTE — Progress Notes (Signed)
 PROVIDER NOTE: Interpretation of information contained herein should be left to medically-trained personnel. Specific patient instructions are provided elsewhere under Patient Instructions section of medical record. This document was created in part using STT-dictation technology, any transcriptional errors that may result from this process are unintentional.  Patient: Valerie Welch Type: Established DOB: 07-01-1971 MRN: [id number] PCP: Sadie Manna, MD  Service: Procedure DOS: 01/13/2025 Setting: Ambulatory Location: Ambulatory outpatient facility Delivery: Face-to-face Provider: Wallie Sherry, MD Specialty: Interventional Pain Management Specialty designation: 09 Location: Outpatient facility Ref. Prov.: Patel, Seema K, NP       Interventional Therapy   Procedure: Lumbar Facet, Medial Branch Radiofrequency Ablation (RFA) #4  Laterality: Bilateral (-50)  Level: L3, L4, and L5 Medial Branch Level(s). These levels will denervate the L3-4 and L4-5 lumbar facet joints.  Imaging: Fluoroscopy-guided         Anesthesia: Local anesthesia (1-2% Lidocaine ) Anxiolysis & Analgesia: Midazolam  and/or Fentanyl  (see dosing details below)  DOS: 01/13/2025  Performed by: Wallie Sherry, MD  Purpose: Therapeutic/Palliative Indications: Low back pain severe enough to impact quality of life or function. Indications: 1. Lumbar spondylosis   2. Lumbar facet arthropathy    [name] has been dealing with the above chronic pain for longer than three months and has either failed to respond, was unable to tolerate, or simply did not get enough benefit from other more conservative therapies including, but not limited to: 1. Over-the-counter medications 2. Anti-inflammatory medications 3. Muscle relaxants 4. Membrane stabilizers 5. Opioids 6. Physical therapy and/or chiropractic manipulation 7. Modalities (Heat, ice, etc.) 8. Invasive techniques such as nerve blocks. Ms. Barsch  has attained more than 50% relief of the pain from a series of diagnostic injections conducted in separate occasions.  Pain Score: Pre-procedure: 6 /10 Post-procedure: 4 /10     Position / Prep / Materials:  Position: Prone  Prep solution: DuraPrep (Iodine  Povacrylex [0.7% available iodine ] and Isopropyl Alcohol, 74% w/w) Prep Area: Entire Lumbosacral Region (Lower back from mid-thoracic region to end of tailbone and from flank to flank.) Materials:  Tray: RFA (Radiofrequency) tray Needle(s):  Type: RFA (Teflon-coated radiofrequency ablation needles) Gauge (G): 22  Length: Regular (10cm) Qty: 3      Pre-op H&P Assessment:  [name] is a 54 y.o. (year old), female patient, seen today for interventional treatment. She  has a past surgical history that includes Cesarean section; Cholecystectomy, laparoscopic; Ankle arthroscopy (Right, 09/26/2018); Tenotomy / flexor tendon transfer (Right, 09/26/2018); Arthrodesis metatarsalphalangeal joint (mtpj) (Right, 09/26/2018); Exostectectomy toe (Right, 09/26/2018); Cholecystectomy; Colonoscopy with propofol  (N/A, 06/16/2020); Esophagogastroduodenoscopy (egd) with propofol  (N/A, 06/16/2020); Robotic assisted laparoscopic hysterectomy and salpingectomy (N/A, 07/05/2020); Colonoscopy with propofol  (N/A, 02/15/2023); Esophagogastroduodenoscopy (egd) with propofol  (N/A, 02/15/2023); Hiatal hernia repair (09/16/2023); and Colonoscopy with propofol  (N/A, 12/06/2023). Ms. Whisnant has a current medication list which includes the following prescription(s): albuterol , aripiprazole, ascorbic acid, ashwagandha, azelastine, bepreve, bupropion, cetirizine, b-12, diclofenac  sodium, dss, duloxetine , estradiol, fluticasone-salmeterol, fluticasone-umeclidin-vilant, gabapentin , hydrocodone -acetaminophen , hydrocodone -acetaminophen , iron polysaccharides, levothyroxine, montelukast , multivitamin with minerals, NON FORMULARY, pantoprazole, polyethylene glycol,  promethazine, pseudoephedrine-guaifenesin, roflumilast, rosuvastatin, tinidazole , trolamine salicylate, and wegovy, and the following Facility-Administered Medications: lactated ringers . Her primarily concern today is the Back Pain  Initial Vital Signs:  Pulse/HCG Rate: 92ECG Heart Rate: 83 Temp: 97.6 F (36.4 C) Resp: 18 BP: 132/89 SpO2: 98 %  BMI: Estimated body mass index is 31.37 kg/m as calculated from the following:   Height as of this encounter: 5' 1 (1.549 m).   Weight as of this encounter: 166 lb (75.3  kg).  Risk Assessment: Allergies: Reviewed. She has no known allergies.  Allergy Precautions: None required Coagulopathies: Reviewed. None identified.  Blood-thinner therapy: None at this time Active Infection(s): Reviewed. None identified. Ms. Maund is afebrile  Site Confirmation: Ms. Krass was asked to confirm the procedure and laterality before marking the site Procedure checklist: Completed Consent: Before the procedure and under the influence of no sedative(s), amnesic(s), or anxiolytics, the patient was informed of the treatment options, risks and possible complications. To fulfill our ethical and legal obligations, as recommended by the American Medical Association's Code of Ethics, I have informed the patient of my clinical impression; the nature and purpose of the treatment or procedure; the risks, benefits, and possible complications of the intervention; the alternatives, including doing nothing; the risk(s) and benefit(s) of the alternative treatment(s) or procedure(s); and the risk(s) and benefit(s) of doing nothing. The patient was provided information about the general risks and possible complications associated with the procedure. These may include, but are not limited to: failure to achieve desired goals, infection, bleeding, organ or nerve damage, allergic reactions, paralysis, and death. In addition, the patient was informed of those risks and  complications associated to Spine-related procedures, such as failure to decrease pain; infection (i.e.: Meningitis, epidural or intraspinal abscess); bleeding (i.e.: epidural hematoma, subarachnoid hemorrhage, or any other type of intraspinal or peri-dural bleeding); organ or nerve damage (i.e.: Any type of peripheral nerve, nerve root, or spinal cord injury) with subsequent damage to sensory, motor, and/or autonomic systems, resulting in permanent pain, numbness, and/or weakness of one or several areas of the body; allergic reactions; (i.e.: anaphylactic reaction); and/or death. Furthermore, the patient was informed of those risks and complications associated with the medications. These include, but are not limited to: allergic reactions (i.e.: anaphylactic or anaphylactoid reaction(s)); adrenal axis suppression; blood sugar elevation that in diabetics may result in ketoacidosis or comma; water retention that in patients with history of congestive heart failure may result in shortness of breath, pulmonary edema, and decompensation with resultant heart failure; weight gain; swelling or edema; medication-induced neural toxicity; particulate matter embolism and blood vessel occlusion with resultant organ, and/or nervous system infarction; and/or aseptic necrosis of one or more joints. Finally, the patient was informed that Medicine is not an exact science; therefore, there is also the possibility of unforeseen or unpredictable risks and/or possible complications that may result in a catastrophic outcome. The patient indicated having understood very clearly. We have given the patient no guarantees and we have made no promises. Enough time was given to the patient to ask questions, all of which were answered to the patient's satisfaction. Ms. Glasner has indicated that she wanted to continue with the procedure. Attestation: I, the ordering provider, attest that I have discussed with the patient the benefits,  risks, side-effects, alternatives, likelihood of achieving goals, and potential problems during recovery for the procedure that I have provided informed consent. Date  Time: 01/13/2025  8:05 AM   Pre-Procedure Preparation:  Monitoring: As per clinic protocol. Respiration, ETCO2, SpO2, BP, heart rate and rhythm monitor placed and checked for adequate function Safety Precautions: Patient was assessed for positional comfort and pressure points before starting the procedure. Time-out: I initiated and conducted the Time-out before starting the procedure, as per protocol. The patient was asked to participate by confirming the accuracy of the Time Out information. Verification of the correct person, site, and procedure were performed and confirmed by me, the nursing staff, and the patient. Time-out conducted as per Joint  Commission's Universal Protocol ([email or web address].01.01.01). Time: 0843 Start Time: 0843 hrs.  Description of Procedure:          Laterality: See above. Levels:  See above. Safety Precautions: Aspiration looking for blood return was conducted prior to all injections. At no point did we inject any substances, as a needle was being advanced. Before injecting, the patient was told to immediately notify me if she was experiencing any new onset of ringing in the ears, or metallic taste in the mouth. No attempts were made at seeking any paresthesias. Safe injection practices and needle disposal techniques used. Medications properly checked for expiration dates. SDV (single dose vial) medications used. After the completion of the procedure, all disposable equipment used was discarded in the proper designated medical waste containers. Local Anesthesia: Protocol guidelines were followed. The patient was positioned over the fluoroscopy table. The area was prepped in the usual manner. The time-out was completed. The target area was identified using fluoroscopy. A 12-in long, straight, sterile hemostat was  used with fluoroscopic guidance to locate the targets for each level blocked. Once located, the skin was marked with an approved surgical skin marker. Once all sites were marked, the skin (epidermis, dermis, and hypodermis), as well as deeper tissues (fat, connective tissue and muscle) were infiltrated with a small amount of a short-acting local anesthetic, loaded on a 10cc syringe with a 25G, 1.5-in  Needle. An appropriate amount of time was allowed for local anesthetics to take effect before proceeding to the next step. Technical description of process:   Radiofrequency Ablation (RFA) L3 Medial Branch Nerve RFA: The target area for the L3 medial branch is at the junction of the postero-lateral aspect of the superior articular process and the superior, posterior, and medial edge of the transverse process of L4. Under fluoroscopic guidance, a Radiofrequency needle was inserted until contact was made with os over the superior postero-lateral aspect of the pedicular shadow (target area). Sensory and motor testing was conducted to properly adjust the position of the needle. Once satisfactory placement of the needle was achieved, the numbing solution was slowly injected after negative aspiration for blood. 2.0 mL of the nerve block solution was injected without difficulty or complication. After waiting for at least 3 minutes, the ablation was performed. Once completed, the needle was removed intact. L4 Medial Branch Nerve RFA: The target area for the L4 medial branch is at the junction of the postero-lateral aspect of the superior articular process and the superior, posterior, and medial edge of the transverse process of L5. Under fluoroscopic guidance, a Radiofrequency needle was inserted until contact was made with os over the superior postero-lateral aspect of the pedicular shadow (target area). Sensory and motor testing was conducted to properly adjust the position of the needle. Once satisfactory placement of  the needle was achieved, the numbing solution was slowly injected after negative aspiration for blood. 2.0 mL of the nerve block solution was injected without difficulty or complication. After waiting for at least 3 minutes, the ablation was performed. Once completed, the needle was removed intact. L5 Medial Branch Nerve RFA: The target area for the L5 medial branch is at the junction of the postero-lateral aspect of the superior articular process of S1 and the superior, posterior, and medial edge of the sacral ala. Under fluoroscopic guidance, a Radiofrequency needle was inserted until contact was made with os over the superior postero-lateral aspect of the pedicular shadow (target area). Sensory and motor testing was conducted to properly adjust the position  of the needle. Once satisfactory placement of the needle was achieved, the numbing solution was slowly injected after negative aspiration for blood. 2.0 mL of the nerve block solution was injected without difficulty or complication. After waiting for at least 3 minutes, the ablation was performed. Once completed, the needle was removed intact.  Radiofrequency lesioning (ablation):  Radiofrequency Generator: Medtronic AccurianTM AG 1000 RF Generator Sensory Stimulation Parameters: 50 Hz was used to locate & identify the nerve, making sure that the needle was positioned such that there was no sensory stimulation below 0.3 V or above 0.7 V. Motor Stimulation Parameters: 2 Hz was used to evaluate the motor component. Care was taken not to lesion any nerves that demonstrated motor stimulation of the lower extremities at an output of less than 2.5 times that of the sensory threshold, or a maximum of 2.0 V. Lesioning Technique Parameters: Standard Radiofrequency settings. (Not bipolar or pulsed.) Temperature Settings: 80 degrees C Lesioning time: 60 seconds Intra-operative Compliance: Compliant  Once the entire procedure was completed, the treated area  was cleaned, making sure to leave some of the prepping solution back to take advantage of its long term bactericidal properties.    Illustration of the posterior view of the lumbar spine and the posterior neural structures. Laminae of L2 through S1 are labeled. DPRL5, dorsal primary ramus of L5; DPRS1, dorsal primary ramus of S1; DPR3, dorsal primary ramus of L3; FJ, facet (zygapophyseal) joint L3-L4; I, inferior articular process of L4; LB1, lateral branch of dorsal primary ramus of L1; IAB, inferior articular branches from L3 medial branch (supplies L4-L5 facet joint); IBP, intermediate branch plexus; MB3, medial branch of dorsal primary ramus of L3; NR3, third lumbar nerve root; S, superior articular process of L5; SAB, superior articular branches from L4 (supplies L4-5 facet joint also); TP3, transverse process of L3.  Facet Joint Innervation (* possible contribution)  L1-2 T12, L1 (L2*)  Medial Branch  L2-3 L1, L2 (L3*)                     L3-4 L2, L3 (L4*)                     L4-5 L3, L4 (L5*)                     L5-S1 L4, L5, S1                        Vitals:   01/13/25 0900 01/13/25 0905 01/13/25 0910 01/13/25 0915  BP: (!) 82/69 90/71 118/88 131/79  Pulse:      Resp: 16 15 16 14   Temp:   (!) 96.8 F (36 C)   TempSrc:   Temporal   SpO2: 100% 100% 100% 100%  Weight:      Height:        Start Time: 0843 hrs. End Time: 0903 hrs.  Imaging Guidance (Spinal):          Type of Imaging Technique: Fluoroscopy Guidance (Spinal) Indication(s): Assistance in needle guidance and placement for procedures requiring needle placement in or near specific anatomical locations not easily accessible without such assistance. Exposure Time: Please see nurses notes. Contrast: None used. Fluoroscopic Guidance: I was personally present during the use of fluoroscopy. Tunnel Vision Technique used to obtain the best possible view of the target area. Parallax error corrected before commencing  the procedure. Direction-depth-direction technique used to introduce the needle under continuous  pulsed fluoroscopy. Once target was reached, antero-posterior, oblique, and lateral fluoroscopic projection used confirm needle placement in all planes. Images permanently stored in EMR. Interpretation: No contrast injected. I personally interpreted the imaging intraoperatively. Adequate needle placement confirmed in multiple planes. Permanent images saved into the patient's record.  Antibiotic Prophylaxis:   Anti-infectives (From admission, onward)    None      Indication(s): None identified  Post-operative Assessment:  Post-procedure Vital Signs:  Pulse/HCG Rate: 9280 Temp: (!) 96.8 F (36 C) Resp: 14 BP: 131/79 SpO2: 100 %  EBL: None  Complications: No immediate post-treatment complications observed by team, or reported by patient.  Note: The patient tolerated the entire procedure well. A repeat set of vitals were taken after the procedure and the patient was kept under observation following institutional policy, for this type of procedure. Post-procedural neurological assessment was performed, showing return to baseline, prior to discharge. The patient was provided with post-procedure discharge instructions, including a section on how to identify potential problems. Should any problems arise concerning this procedure, the patient was given instructions to immediately contact us , at any time, without hesitation. In any case, we plan to contact the patient by telephone for a follow-up status report regarding this interventional procedure.  Comments:  No additional relevant information.  Plan of Care (POC)  Orders:  Orders Placed This Encounter  Procedures   DG PAIN CL[facility] C-ARM 1-60 MIN NO REPORT    Intraoperative interpretation by procedural physician at [facility].    Standing Status:   Standing    Number of Occurrences:   1    Reason for exam::   Assistance in  needle guidance and placement for procedures requiring needle placement in or near specific anatomical locations not easily accessible without such assistance.     Medications ordered for procedure: Meds ordered this encounter  Medications   lidocaine  (XYLOCAINE ) 2 % (with pres) injection 400 mg   lactated ringers  infusion   fentaNYL  (SUBLIMAZE ) injection 25-50 mcg    Make sure Narcan is available in the pyxis when using this medication. In the event of respiratory depression (RR< 8/min): Titrate NARCAN (naloxone) in increments of 0.1 to 0.2 mg IV at 2-3 minute intervals, until desired degree of reversal.   midazolam  PF (VERSED ) injection 0.5-2 mg    Make sure Flumazenil is available in the pyxis when using this medication. If oversedation occurs, administer 0.2 mg IV over 15 sec. If after 45 sec no response, administer 0.2 mg again over 1 min; may repeat at 1 min intervals; not to exceed 4 doses (1 mg)   ropivacaine  (PF) 2 mg/mL (0.2%) (NAROPIN ) injection 18 mL   dexamethasone  (DECADRON ) injection 20 mg   Medications administered: We administered lidocaine , lactated ringers , fentaNYL , midazolam  PF, ropivacaine  (PF) 2 mg/mL (0.2%), and dexamethasone .  See the medical record for exact dosing, route, and time of administration.  Follow-up plan:   Return in about 8 weeks (around 03/10/2025) for PPE, F2F, Seema.       Recent Visits Date Type Provider Dept  12/09/24 Office Visit Patel, Seema K, NP [facility]  Showing recent visits within past 90 days and meeting all other requirements Today's Visits Date Type Provider Dept  01/13/25 Procedure visit Marcelino Nurse, MD [facility]  Showing today's visits and meeting all other requirements Future Appointments Date Type Provider Dept  03/01/25 Appointment Patel, Seema K, NP [facility]  Showing future appointments within next 90 days and meeting all other requirements  Disposition: Discharge home  Discharge  (Date  Time): 01/13/2025;   hrs.   Primary Care Physician: Sadie Manna, MD Location: [facility] Note by: Wallie Sherry, MD (TTS technology used. I apologize for any typographical errors that were not detected and corrected.) Date: 01/13/2025; Time: 9:22 AM  Disclaimer:  Medicine is not an visual merchandiser. The only guarantee in medicine is that nothing is guaranteed. It is important to note that the decision to proceed with this intervention was based on the information collected from the patient. The Data and conclusions were drawn from the patient's questionnaire, the interview, and the physical examination. Because the information was provided in large part by the patient, it cannot be guaranteed that it has not been purposely or unconsciously manipulated. Every effort has been made to obtain as much relevant data as possible for this evaluation. It is important to note that the conclusions that lead to this procedure are derived in large part from the available data. Always take into account that the treatment will also be dependent on availability of resources and existing treatment guidelines, considered by other Pain Management Practitioners as being common knowledge and practice, at the time of the intervention. For Medico-Legal purposes, it is also important to point out that variation in procedural techniques and pharmacological choices are the acceptable norm. The indications, contraindications, technique, and results of the above procedure should only be interpreted and judged by a Board-Certified Interventional Pain Specialist with extensive familiarity and expertise in the same exact procedure and technique.

## 2025-01-14 ENCOUNTER — Telehealth: Payer: Self-pay | Admitting: *Deleted

## 2025-01-14 NOTE — Telephone Encounter (Signed)
 Post procedure call;  patient reports she is doing well.  ?

## 2025-03-01 ENCOUNTER — Encounter: Admitting: Nurse Practitioner
